# Patient Record
Sex: Female | Born: 1935 | Race: White | Hispanic: No | Smoking: Former smoker
Health system: Southern US, Community
[De-identification: ages and names within clinical notes are randomized; demographics above are authoritative.]

## PROBLEM LIST (undated history)

## (undated) DIAGNOSIS — K589 Irritable bowel syndrome without diarrhea: Secondary | ICD-10-CM

## (undated) DIAGNOSIS — I7300 Raynaud's syndrome without gangrene: Secondary | ICD-10-CM

## (undated) DIAGNOSIS — I739 Peripheral vascular disease, unspecified: Secondary | ICD-10-CM

## (undated) DIAGNOSIS — I6529 Occlusion and stenosis of unspecified carotid artery: Secondary | ICD-10-CM

## (undated) DIAGNOSIS — Z923 Personal history of irradiation: Secondary | ICD-10-CM

## (undated) DIAGNOSIS — D649 Anemia, unspecified: Secondary | ICD-10-CM

## (undated) DIAGNOSIS — I10 Essential (primary) hypertension: Secondary | ICD-10-CM

## (undated) DIAGNOSIS — N289 Disorder of kidney and ureter, unspecified: Secondary | ICD-10-CM

## (undated) DIAGNOSIS — R490 Dysphonia: Secondary | ICD-10-CM

## (undated) HISTORY — DX: Dysphonia: R49.0

## (undated) HISTORY — PX: COLONOSCOPY: SHX174

## (undated) HISTORY — PX: BREAST SURGERY: SHX581

## (undated) HISTORY — DX: Occlusion and stenosis of unspecified carotid artery: I65.29

## (undated) HISTORY — PX: HEMORRHOID SURGERY: SHX153

## (undated) HISTORY — DX: Raynaud's syndrome without gangrene: I73.00

## (undated) HISTORY — DX: Essential (primary) hypertension: I10

## (undated) HISTORY — DX: Personal history of irradiation: Z92.3

## (undated) HISTORY — DX: Peripheral vascular disease, unspecified: I73.9

## (undated) HISTORY — DX: Irritable bowel syndrome, unspecified: K58.9

## (undated) HISTORY — DX: Anemia, unspecified: D64.9

## (undated) HISTORY — DX: Disorder of kidney and ureter, unspecified: N28.9

---

## 1997-09-06 ENCOUNTER — Other Ambulatory Visit: Admission: RE | Admit: 1997-09-06 | Discharge: 1997-09-06 | Payer: Self-pay | Admitting: Specialist

## 1998-03-12 ENCOUNTER — Other Ambulatory Visit: Admission: RE | Admit: 1998-03-12 | Discharge: 1998-03-12 | Payer: Self-pay | Admitting: Family Medicine

## 1998-09-18 ENCOUNTER — Ambulatory Visit (HOSPITAL_BASED_OUTPATIENT_CLINIC_OR_DEPARTMENT_OTHER): Admission: RE | Admit: 1998-09-18 | Discharge: 1998-09-18 | Payer: Self-pay | Admitting: General Surgery

## 1998-09-18 ENCOUNTER — Encounter: Payer: Self-pay | Admitting: General Surgery

## 2000-01-20 ENCOUNTER — Encounter: Admission: RE | Admit: 2000-01-20 | Discharge: 2000-01-20 | Payer: Self-pay | Admitting: Specialist

## 2000-01-20 ENCOUNTER — Encounter: Payer: Self-pay | Admitting: Specialist

## 2000-01-22 ENCOUNTER — Encounter (INDEPENDENT_AMBULATORY_CARE_PROVIDER_SITE_OTHER): Payer: Self-pay | Admitting: Specialist

## 2000-01-22 ENCOUNTER — Ambulatory Visit (HOSPITAL_BASED_OUTPATIENT_CLINIC_OR_DEPARTMENT_OTHER): Admission: RE | Admit: 2000-01-22 | Discharge: 2000-01-22 | Payer: Self-pay | Admitting: Specialist

## 2002-04-30 ENCOUNTER — Other Ambulatory Visit: Admission: RE | Admit: 2002-04-30 | Discharge: 2002-04-30 | Payer: Self-pay | Admitting: Family Medicine

## 2002-05-18 ENCOUNTER — Encounter: Payer: Self-pay | Admitting: Family Medicine

## 2002-05-18 ENCOUNTER — Encounter: Admission: RE | Admit: 2002-05-18 | Discharge: 2002-05-18 | Payer: Self-pay | Admitting: Family Medicine

## 2002-07-18 ENCOUNTER — Encounter: Payer: Self-pay | Admitting: Family Medicine

## 2002-07-18 ENCOUNTER — Encounter: Admission: RE | Admit: 2002-07-18 | Discharge: 2002-07-18 | Payer: Self-pay | Admitting: Family Medicine

## 2004-03-11 ENCOUNTER — Ambulatory Visit (HOSPITAL_COMMUNITY): Admission: RE | Admit: 2004-03-11 | Discharge: 2004-03-11 | Payer: Self-pay | Admitting: Gastroenterology

## 2004-04-26 ENCOUNTER — Emergency Department (HOSPITAL_COMMUNITY): Admission: EM | Admit: 2004-04-26 | Discharge: 2004-04-26 | Payer: Self-pay | Admitting: Family Medicine

## 2004-04-28 ENCOUNTER — Emergency Department (HOSPITAL_COMMUNITY): Admission: EM | Admit: 2004-04-28 | Discharge: 2004-04-28 | Payer: Self-pay | Admitting: Family Medicine

## 2004-07-12 ENCOUNTER — Encounter: Admission: RE | Admit: 2004-07-12 | Discharge: 2004-07-12 | Payer: Self-pay | Admitting: Family Medicine

## 2005-07-20 ENCOUNTER — Other Ambulatory Visit: Admission: RE | Admit: 2005-07-20 | Discharge: 2005-07-20 | Payer: Self-pay | Admitting: Family Medicine

## 2008-08-15 ENCOUNTER — Encounter: Admission: RE | Admit: 2008-08-15 | Discharge: 2008-08-15 | Payer: Self-pay | Admitting: Family Medicine

## 2008-09-10 ENCOUNTER — Encounter: Admission: RE | Admit: 2008-09-10 | Discharge: 2008-09-10 | Payer: Self-pay | Admitting: Family Medicine

## 2010-06-16 DIAGNOSIS — D649 Anemia, unspecified: Secondary | ICD-10-CM

## 2010-06-16 HISTORY — DX: Anemia, unspecified: D64.9

## 2010-07-03 NOTE — Op Note (Signed)
[name], [name]               ACCOUNT NO.:  [id number]   MEDICAL RECORD NO.:  [id number]          PATIENT TYPE:  AMB   LOCATION:  ENDO                         FACILITY:  [facility]   PHYSICIAN:  Danise Edge, M.D.   DATE OF BIRTH:  28-Aug-1926   DATE OF PROCEDURE:  03/11/2004  DATE OF DISCHARGE:                                 OPERATIVE REPORT   PROCEDURE:  Colonoscopy.   INDICATION FOR PROCEDURE:  Ms. Sylvia [name] is a 77 year old female born  28-Jul-1926.  Ms. Sylvia [name] is scheduled to undergo her first screening  colonoscopy with polypectomy to prevent colon cancer.  Demerol causes  nausea.   ENDOSCOPIST:  Danise Edge, M.D.   PREMEDICATION:  Fentanyl 62.5 mcg, Versed 3.5 mg.   PROCEDURE:  After obtaining informed consent, Sylvia [name] was placed in the  left lateral decubitus position.  I administered intravenous fentanyl and  intravenous Versed to achieve conscious sedation for the procedure.  The  patient's blood pressure, oxygen saturation, and cardiac rhythm were  monitored throughout the procedure and documented in the medical record.   Anal inspection and digital rectal exam were normal.  The Olympus adjustable  pediatric colonoscope was introduced into the rectum and advanced to the  cecum.  Colonic preparation for the exam today was excellent.   Rectum:  Normal.   Sigmoid colon and descending colon:  Normal.   Splenic flexure:  Normal.   Transverse colon:  Normal.   Hepatic flexure:  Normal.   Ascending colon:  Normal.   Cecum and ileocecal valve:  Normal.   ASSESSMENT:  Normal screening proctocolonoscopy to the cecum.      MJ/MEDQ  D:  03/11/2004  T:  03/11/2004  Job:  [id number]   cc:   Donia Guiles, M.D.  [address] [place]  [place] [address]  Fax: [phone]

## 2010-10-30 ENCOUNTER — Other Ambulatory Visit: Payer: Self-pay | Admitting: Family Medicine

## 2010-11-02 ENCOUNTER — Ambulatory Visit
Admission: RE | Admit: 2010-11-02 | Discharge: 2010-11-02 | Disposition: A | Discharge status: Home / Self Care | Payer: Medicare Other | Source: Ambulatory Visit | Attending: Family Medicine | Admitting: Family Medicine

## 2011-01-22 ENCOUNTER — Other Ambulatory Visit: Payer: Self-pay | Admitting: Family Medicine

## 2011-01-22 DIAGNOSIS — R51 Headache: Secondary | ICD-10-CM

## 2011-01-26 ENCOUNTER — Ambulatory Visit
Admission: RE | Admit: 2011-01-26 | Discharge: 2011-01-26 | Disposition: A | Discharge status: Home / Self Care | Payer: Medicare Other | Source: Ambulatory Visit | Attending: Family Medicine | Admitting: Family Medicine

## 2011-01-26 DIAGNOSIS — R51 Headache: Secondary | ICD-10-CM

## 2011-06-01 ENCOUNTER — Telehealth: Payer: Self-pay | Admitting: Oncology

## 2011-06-01 NOTE — Telephone Encounter (Signed)
called pt scheduled appt for 04/22 @ 10am.  will fax a letter to Dr. Laural Benes with appt d/t

## 2011-06-02 ENCOUNTER — Telehealth: Payer: Self-pay | Admitting: Oncology

## 2011-06-02 NOTE — Telephone Encounter (Signed)
Referred by Dr. Danise Edge Dx- Anemia

## 2011-06-03 ENCOUNTER — Encounter: Payer: Self-pay | Admitting: Oncology

## 2011-06-03 DIAGNOSIS — I10 Essential (primary) hypertension: Secondary | ICD-10-CM | POA: Insufficient documentation

## 2011-06-03 DIAGNOSIS — D649 Anemia, unspecified: Secondary | ICD-10-CM | POA: Insufficient documentation

## 2011-06-03 DIAGNOSIS — I7300 Raynaud's syndrome without gangrene: Secondary | ICD-10-CM | POA: Insufficient documentation

## 2011-06-07 ENCOUNTER — Ambulatory Visit: Payer: Medicare Other

## 2011-06-07 ENCOUNTER — Ambulatory Visit (HOSPITAL_BASED_OUTPATIENT_CLINIC_OR_DEPARTMENT_OTHER): Payer: Medicare Other | Admitting: Oncology

## 2011-06-07 ENCOUNTER — Other Ambulatory Visit (HOSPITAL_BASED_OUTPATIENT_CLINIC_OR_DEPARTMENT_OTHER): Payer: Medicare Other | Admitting: Lab

## 2011-06-07 ENCOUNTER — Telehealth: Payer: Self-pay | Admitting: Oncology

## 2011-06-07 ENCOUNTER — Encounter: Payer: Self-pay | Admitting: Oncology

## 2011-06-07 VITALS — BP 148/50 | HR 70 | Temp 97.2°F | Ht 63.0 in | Wt 103.7 lb

## 2011-06-07 DIAGNOSIS — I7300 Raynaud's syndrome without gangrene: Secondary | ICD-10-CM

## 2011-06-07 DIAGNOSIS — D649 Anemia, unspecified: Secondary | ICD-10-CM

## 2011-06-07 DIAGNOSIS — R490 Dysphonia: Secondary | ICD-10-CM

## 2011-06-07 DIAGNOSIS — I10 Essential (primary) hypertension: Secondary | ICD-10-CM

## 2011-06-07 LAB — CHCC SMEAR

## 2011-06-07 LAB — CBC WITH DIFFERENTIAL/PLATELET
BASO%: 0.5 % (ref 0.0–2.0)
EOS%: 3.2 % (ref 0.0–7.0)
HGB: 9.6 g/dL — ABNORMAL LOW (ref 11.6–15.9)
MCH: 25.9 pg (ref 25.1–34.0)
MCHC: 32.2 g/dL (ref 31.5–36.0)
RDW: 16.1 % — ABNORMAL HIGH (ref 11.2–14.5)
lymph#: 0.9 10*3/uL (ref 0.9–3.3)

## 2011-06-07 LAB — MORPHOLOGY: PLT EST: INCREASED

## 2011-06-07 NOTE — Patient Instructions (Signed)
Issue:  Progressive anemia over the last few years.  - Possibilities:  Hemolysis (breakage of red blood cell); iron deficiency anemia (malabsorption) vs something is going on with the bone marrow space (MDS myelodysplastic syndrome). - Pending blood test today to see if you have hemolysis or iron deficiency anemia. - Please call my nurse Cameo at [phone] this week to let her know when is a best day for bone marrow biopsy.

## 2011-06-07 NOTE — Telephone Encounter (Signed)
Gv pt appt for may2013 

## 2011-06-07 NOTE — Progress Notes (Signed)
Sylvia [name]  Telephone:([phone] [phone]     INITIAL HEMATOLOGY CONSULTATION    Referral MD:   Dr. Rockney Ghee D. Clelia Croft, M.D.   Reason for Referral: refractory normocytic anemia.     HPI:  Sylvia [name] is an 78 year-old Caucasian woman with history of HTN, Raynaud's phenomenon; IBS.  She has had mild anemia up until last year. On 06/18/2010 her WBC 5.6; hemoglobin 11.2, platelet count 311,000.  A followup CBC was drawn on 09/04/2011 where WBC was 5.8; hemoglobin 8.2; platelet count 368K.  Anemia workupwas performed which showed  normal VitB12 and iron panel supposedly; however, I do not have these tests for confirmation. She was referred to Dr. Danise Edge for evaluation. Unable fourth 2013, she underwent diagnostic colonoscopy which showed negative colonoscopy. Since she was asymptomatic; it was deemed that EGD was not indicated. Given the refractory anemia, she was kindly referred to the cancer [name] for evaluation.  Sylvia [name] presented to the clinic by herself today. She has had fatigue that this was in the past year. 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 She noticed that she wakes up at night a lot because she's on diuretics for lood pressure.  She goes to the bathroom multiple times night; therefore, she does have insomnia. She notices she's been having nonproductive cough in addition to hoarse voice last few months. She denies shortness of breath, dyspnea exertion, PND, orthopnea, hemoptysis. She denies August was also bleeding such as gum bleed, hemoptysis, hematemesis, melena, hematochezia, vaginal bleeding, hematuria.  Patient denies visual changes, confusion, drenching night sweats, palpable lymph node swelling, mucositis, odynophagia, dysphagia, nausea vomiting, jaundice, chest pain, palpitation, shortness of breath, dyspnea on exertion, abdominal pain, abdominal swelling, early satiety, skin rash,  spontaneous bleeding, joint swelling, joint pain, heat or cold intolerance, bowel bladder incontinence, back pain, focal motor weakness, paresthesia, depression, suicidal or homocidal ideation, feeling hopelessness.     Past Medical History  Diagnosis Date  . HTN (hypertension)   . Renal insufficiency   . IBS (irritable bowel syndrome)   . Anemia 06/2010  . Raynaud's syndrome   . PVD (peripheral vascular disease)   . Carotid stenosis   . Raynaud phenomenon since 1990's  . Hoarseness of voice   :    Past Surgical History  Procedure Date  . Colonoscopy 02/2004 and 05/2011    last colonoscopy in April 2013 was negative by Dr. Laural Benes  . Hemorrhoid surgery   :   CURRENT MEDS: Current Outpatient Prescriptions  Medication Sig Dispense Refill  . BENICAR HCT 40-12.5 MG per tablet       . BIOTIN PO Take by mouth daily.      . felodipine (PLENDIL) 10 MG 24 hr tablet       . ferrous sulfate 325 (65 FE) MG tablet Take 325 mg by mouth daily with breakfast.      . fish oil-omega-3 fatty acids 1000 MG capsule Take 1 g by mouth daily.      . vitamin C (ASCORBIC ACID) 500 MG tablet Take 500 mg by mouth daily.      Marland Kitchen VITAMIN D, CHOLECALCIFEROL, PO Take by mouth daily.      . Vitamin Mixture (VITAMIN E COMPLETE) CAPS Take by mouth daily.          No Known Allergies:  Family History  Problem Relation Age of Onset  . Pneumonia Mother   . Stroke Father   :  History   Social History  . Marital Status: Widowed    Spouse Name: N/A    Number of Children: 4  . Years of Education: N/A   Occupational History  .      retired from [employer].  Currently works at [employer] History Main Topics  . Smoking status: Former Smoker -- 0.2 packs/day for 60 years  . Smokeless tobacco: Not on file  . Alcohol Use: No  . Drug Use: No  . Sexually Active: No   Other Topics Concern  . Not on file   Social History Narrative  . No narrative on file  :  REVIEW OF SYSTEM:  The rest of the  14-point review of sytem was negative.   Exam:  General:  Thin-appearing woman in no acute distress.  Eyes:  no scleral icterus.  ENT:  There were no oropharyngeal lesions.  Neck was without thyromegaly.  Lymphatics:  Negative cervical, supraclavicular or axillary adenopathy.  Respiratory: lungs were clear bilaterally without wheezing or crackles.  Cardiovascular:  Regular rate and rhythm, S1/S2, without murmur, rub or gallop.  There was no pedal edema.  GI:  abdomen was soft, flat, nontender, nondistended, without organomegaly.  Muscoloskeletal:  no spinal tenderness of palpation of vertebral spine.  Skin exam was without echymosis, petichae.  Neuro exam was nonfocal.  Patient was able to get on and off exam table without assistance.  Gait was normal.  Patient was alerted and oriented.  Attention was good.   Language was appropriate.  Mood was normal without depression.  Speech was not pressured.  Thought content was not tangential.    LABS:  Lab Results  Component Value Date   WBC 6.1 06/07/2011   HGB 9.6* 06/07/2011   HCT 29.8* 06/07/2011   PLT 456* 06/07/2011   GLUCOSE 108* 06/07/2011   ALT 12 06/07/2011   AST 21 06/07/2011   NA 134* 06/07/2011   K 4.5 06/07/2011   CL 96 06/07/2011   CREATININE 0.98 06/07/2011   BUN 32* 06/07/2011   CO2 27 06/07/2011    Blood smear review:   I personally reviewed the patient's peripheral blood smear today.  There was isocytosis.  There was no peripheral blast.  There was no schistocytosis, spherocytosis, target cell, rouleaux formation, tear drop cell.  There was no giant platelets or platelet clumps.     ASSESSMENT AND PLAN:   1.  HTN:  Well controlled on Benicar/HCTZ; Felodipine, per PCP.   2.  History of smoking:  She quit 2 years ago.   3.  Hoarse voice:  She would like to defer work up of the hoarse voice for now.  She really wants to have the anemia figured out first.  In the future, I may consider sending her out for work up with PA/Lact CXR given  her history of smoking to rule out bronchogenic carcinoma; and if negative, ENT referral.  4.  Normocytic anemia with negative GI work up. - Differential:  Ruled out for hemolysis with normal LDH, Tbili, and haptoglobin.  I do not think that her Raynaud's phenomenon has any relation with her anemia since cold agglutinin disease would present with hemolysis.  I sent for complete iron panel to see if there is sign of iron deficiency which can be due to malabsorption as her colonoscopy was negative.  However, I am considering bone marrow failure state such as MDS, pure red cell aplasia due to the progressive nature of her anemia.  - Work  up:  I recommended diagnostic bone marrow biopsy this week.  I discussed with her the potential risk and benefits of diagnostic bone marrow biopsy. The benefit would be to take a watch as anemia. These risks include but not limited to infection, bleeding, perforation, pain. She expressed informed understanding and wished to proceed with the bone marrow biopsy this week. - Treatment:  We briefly talked about pRBC transfusion for Hgb <7 if diagnosis is non revealing.  She inquired about the treatment for MDS.  We briefly discussed that if bone marrow biopsy shows that she has MDS; and the treatment will depend on the risk category.  Thus, treatment for MDS can range anywhere from pRBC transfusion to Aranesp injection to chemotherapy. - Follow up:  I will see her in about 4 wks to go over result of diagnostic bone marrow biopsy including cytogenetics.   The length of time of the face-to-face encounter was 45 minutes. More than 50% of time was spent counseling and coordination of care.

## 2011-06-09 LAB — LACTATE DEHYDROGENASE: LDH: 175 U/L (ref 94–250)

## 2011-06-09 LAB — COMPREHENSIVE METABOLIC PANEL
AST: 21 U/L (ref 0–37)
Albumin: 4.2 g/dL (ref 3.5–5.2)
Alkaline Phosphatase: 70 U/L (ref 39–117)
Potassium: 4.5 mEq/L (ref 3.5–5.3)
Sodium: 134 mEq/L — ABNORMAL LOW (ref 135–145)
Total Protein: 7 g/dL (ref 6.0–8.3)

## 2011-06-09 LAB — KAPPA/LAMBDA LIGHT CHAINS: Kappa free light chain: 2.13 mg/dL — ABNORMAL HIGH (ref 0.33–1.94)

## 2011-06-09 LAB — PROTEIN ELECTROPHORESIS, SERUM
Albumin ELP: 55.7 % — ABNORMAL LOW (ref 55.8–66.1)
Alpha-1-Globulin: 5 % — ABNORMAL HIGH (ref 2.9–4.9)
Gamma Globulin: 16.7 % (ref 11.1–18.8)

## 2011-06-09 LAB — IRON AND TIBC: %SAT: 59 % — ABNORMAL HIGH (ref 20–55)

## 2011-06-10 ENCOUNTER — Ambulatory Visit (HOSPITAL_BASED_OUTPATIENT_CLINIC_OR_DEPARTMENT_OTHER): Payer: Medicare Other | Admitting: Oncology

## 2011-06-10 ENCOUNTER — Other Ambulatory Visit (HOSPITAL_COMMUNITY)
Admission: RE | Admit: 2011-06-10 | Discharge: 2011-06-10 | Disposition: A | Discharge status: Home / Self Care | Payer: Medicare Other | Source: Ambulatory Visit | Attending: Oncology | Admitting: Oncology

## 2011-06-10 ENCOUNTER — Encounter: Payer: Medicare Other | Admitting: Medical Oncology

## 2011-06-10 VITALS — BP 133/49 | HR 64 | Temp 97.7°F

## 2011-06-10 DIAGNOSIS — D649 Anemia, unspecified: Secondary | ICD-10-CM

## 2011-06-10 DIAGNOSIS — D4620 Refractory anemia with excess of blasts, unspecified: Secondary | ICD-10-CM

## 2011-06-10 DIAGNOSIS — C3490 Malignant neoplasm of unspecified part of unspecified bronchus or lung: Secondary | ICD-10-CM

## 2011-06-10 NOTE — Patient Instructions (Signed)
Bone Marrow Aspiration, Bone Marrow Biopsy Care After Read the instructions outlined below and refer to this sheet in the next few weeks. These discharge instructions provide you with general information on caring for yourself after you leave the hospital. Your caregiver may also give you specific instructions. While your treatment has been planned according to the most current medical practices available, unavoidable complications occasionally occur. If you have any problems or questions after discharge, call your caregiver. FINDING OUT THE RESULTS OF YOUR TEST Not all test results are available during your visit. If your test results are not back during the visit, make an appointment with your caregiver to find out the results. Do not assume everything is normal if you have not heard from your caregiver or the medical facility. It is important for you to follow up on all of your test results.  HOME CARE INSTRUCTIONS   Keep your dressing clean and dry. You may replace dressing with a bandage after 24 hours.   You may take a bath or shower after 24 hours.   Use an ice pack for 20 minutes every 2 hours while awake for pain as needed.  SEEK MEDICAL CARE IF:   There is redness, swelling, or increasing pain at the biopsy site.   There is pus coming from the biopsy site.   There is drainage from a biopsy site lasting longer than one day.   An unexplained oral temperature above 102 F (38.9 C) develops.  SEEK IMMEDIATE MEDICAL CARE IF:   You develop a rash.   You have difficulty breathing.   You develop any reaction or side effects to medications given.  Document Released: 08/21/2004 Document Revised: 01/21/2011 Document Reviewed: 01/30/2008 ExitCare Patient Information 2012 [employer]. 

## 2011-06-10 NOTE — Progress Notes (Signed)
   [facility]  Telephone:([phone] Fax:(336) [phone]   BONE MARROW BIOPSY AND ASPIRATION   INDICATION:  Refractory anemia with negative extensive work up.   Procedure: After obtained from consent, Sylvia [name] was placed in the prone position. Time out was performed verifying correct patient and procedure. The skin overlying the left posterior crest was prepped with Betadine and draped in the usual sterile fashion. The skin and periosteum were infiltrated with 10 mL of 2% lidocaine. A small puncture wound was made with #11 scalpel blade.  Bone marrow aspirate was obtained on the first pass of the aspiration needle.  One core biopsy was obtained through the same incision.   The aspirate was sent for routine histology, flow cytometry, and cytogenetics.  Core biopsy was sent for routine histology.   Sylvia [name] tolerated procedure well with minimal  blood loss and without immediate complication.   A sterile dressing was applied.   Jethro Bolus M.D. 06/10/2011

## 2011-06-16 ENCOUNTER — Telehealth: Payer: Self-pay | Admitting: *Deleted

## 2011-06-16 NOTE — Telephone Encounter (Signed)
Pt called to report she continues to feel very tired and having difficulty getting to work.  States a lot of frustration and wondering what the plan is to help her get better.  States also having "bowel issues" and "leg pains."   Listened to pt for aprox 10 minutes and explained Dr. Gaylyn Rong providing work up for anemia and will go over results on next appt on 07/07/11.   Asked if pt is feeling more tired than on last appt,  We can check her labs again,  But to call PCP regarding her bowels and leg pains.   Pt states she doesn't feel any more tired, but the same ongoing fatigue.  She will call her PCP and keep appt w/ Dr. Gaylyn Rong on 5/22 as scheduled.

## 2011-07-05 ENCOUNTER — Telehealth: Payer: Self-pay | Admitting: *Deleted

## 2011-07-05 NOTE — Telephone Encounter (Signed)
Pt called to confirm her appts for Wed and to report ongoing fatigue and worsening hoarseness, congestion, cough. She says she recently saw her PCP who told her symptoms are r/t her anemia.  Pt voiced some frustration about feeling so tired for "so long"  Encouraged pt to keep her appt here on 07/07/11 and discuss her concerns w/ Dr. Gaylyn Rong.  She verbalized understanding.

## 2011-07-05 NOTE — Telephone Encounter (Signed)
Pt left VM states has question about her appt on Wednesday.  I called pt back and left her a VM asking her to return call again so I can answer her question.

## 2011-07-07 ENCOUNTER — Other Ambulatory Visit (HOSPITAL_BASED_OUTPATIENT_CLINIC_OR_DEPARTMENT_OTHER): Payer: Medicare Other | Admitting: Lab

## 2011-07-07 ENCOUNTER — Ambulatory Visit (HOSPITAL_BASED_OUTPATIENT_CLINIC_OR_DEPARTMENT_OTHER): Payer: Medicare Other | Admitting: Oncology

## 2011-07-07 VITALS — BP 146/56 | HR 63 | Ht 63.0 in | Wt 100.8 lb

## 2011-07-07 DIAGNOSIS — R490 Dysphonia: Secondary | ICD-10-CM

## 2011-07-07 DIAGNOSIS — D649 Anemia, unspecified: Secondary | ICD-10-CM

## 2011-07-07 DIAGNOSIS — I10 Essential (primary) hypertension: Secondary | ICD-10-CM

## 2011-07-07 DIAGNOSIS — I7300 Raynaud's syndrome without gangrene: Secondary | ICD-10-CM

## 2011-07-07 DIAGNOSIS — R5381 Other malaise: Secondary | ICD-10-CM

## 2011-07-07 LAB — CBC WITH DIFFERENTIAL/PLATELET
BASO%: 0.7 % (ref 0.0–2.0)
Basophils Absolute: 0 10e3/uL (ref 0.0–0.1)
EOS%: 0.8 % (ref 0.0–7.0)
Eosinophils Absolute: 0 10e3/uL (ref 0.0–0.5)
HCT: 33.5 % — ABNORMAL LOW (ref 34.8–46.6)
HGB: 11 g/dL — ABNORMAL LOW (ref 11.6–15.9)
LYMPH%: 17.1 % (ref 14.0–49.7)
MCH: 26.7 pg (ref 25.1–34.0)
MCHC: 32.7 g/dL (ref 31.5–36.0)
MCV: 81.8 fL (ref 79.5–101.0)
MONO#: 0.8 10e3/uL (ref 0.1–0.9)
MONO%: 13.3 % (ref 0.0–14.0)
NEUT#: 4 10e3/uL (ref 1.5–6.5)
NEUT%: 68.1 % (ref 38.4–76.8)
Platelets: 474 10e3/uL — ABNORMAL HIGH (ref 145–400)
RBC: 4.1 10e6/uL (ref 3.70–5.45)
RDW: 18.9 % — ABNORMAL HIGH (ref 11.2–14.5)
WBC: 5.9 10e3/uL (ref 3.9–10.3)
lymph#: 1 10e3/uL (ref 0.9–3.3)
nRBC: 0 % (ref 0–0)

## 2011-07-07 NOTE — Progress Notes (Signed)
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  Bone marrow biopsy in 05/2011 showed normal trilineage hematopoiesis; cytogenetics were normal.  GI work up in 2013 was negative.   CURRENT THERAPY:  Watchful observation.   INTERVAL HISTORY: Sylvia [name] 78 y.o. female returns for regular follow up to go over result of bone marrow biopsy.  She reports that she still has mild fatigue.  Despite this fatigue, she still works at [employer]' and able to climb flight of stairs at home.  She has seasonal allergy with cough and hoarse voice.     Past Medical History  Diagnosis Date  . HTN (hypertension)   . Renal insufficiency   . IBS (irritable bowel syndrome)   . Anemia 06/2010  . Raynaud's syndrome   . PVD (peripheral vascular disease)   . Carotid stenosis   . Raynaud phenomenon since 1990's  . Hoarseness of voice     Past Surgical History  Procedure Date  . Colonoscopy 02/2004 and 05/2011    last colonoscopy in April 2013 was negative by Dr. Laural Benes  . Hemorrhoid surgery     Current Outpatient Prescriptions  Medication Sig Dispense Refill  . BENICAR HCT 40-12.5 MG per tablet       . BIOTIN PO Take by mouth daily.      . felodipine (PLENDIL) 10 MG 24 hr tablet       . ferrous sulfate 325 (65 FE) MG tablet Take 325 mg by mouth daily with breakfast.      . fish oil-omega-3 fatty acids 1000 MG capsule Take 1 g by mouth daily.      . vitamin C (ASCORBIC ACID) 500 MG tablet Take 500 mg by mouth daily.      Marland Kitchen VITAMIN D, CHOLECALCIFEROL, PO Take by mouth daily.      . Vitamin Mixture (VITAMIN E COMPLETE) CAPS Take by mouth daily.        ALLERGIES:   has no known allergies.  REVIEW OF SYSTEMS:  The rest of the 14-point review of system was negative.   Filed Vitals:   07/07/11 1152  BP: 146/56  Pulse: 63   Wt Readings  from Last 3 Encounters:  07/07/11 100 lb 12.8 oz (45.723 kg)  06/07/11 103 lb 11.2 oz (47.038 kg)   ECOG Performance status: 1  PHYSICAL EXAMINATION:   General:  Thin-appearing woman, in no acute distress.  Eyes:  no scleral icterus.  ENT:  There were no oropharyngeal lesions.  Neck was without thyromegaly.  Lymphatics:  Negative cervical, supraclavicular or axillary adenopathy.  Respiratory: lungs were clear bilaterally without wheezing or crackles.  Cardiovascular:  Regular rate and rhythm, S1/S2, without murmur, rub or gallop.  There was no pedal edema.  GI:  abdomen was soft, flat, nontender, nondistended, without organomegaly.  Muscoloskeletal:  no spinal tenderness of palpation of vertebral spine.  Skin exam was without echymosis, petichae.  Neuro exam was nonfocal.  Patient was able to get on and off exam table without assistance.  Gait was normal.  Patient was alerted and oriented.  Attention was good.   Language was appropriate.  Mood was normal without depression.  Speech was not pressured.  Thought content was not tangential.     LABORATORY/RADIOLOGY DATA:  Lab Results  Component Value Date   WBC 5.9 07/07/2011   HGB 11.0* 07/07/2011  HCT 33.5* 07/07/2011   PLT 474* 07/07/2011   GLUCOSE 108* 06/07/2011   ALKPHOS 70 06/07/2011   ALT 12 06/07/2011   AST 21 06/07/2011   NA 134* 06/07/2011   K 4.5 06/07/2011   CL 96 06/07/2011   CREATININE 0.98 06/07/2011   BUN 32* 06/07/2011   CO2 27 06/07/2011    ASSESSMENT AND PLAN:   1. HTN: Well controlled on Benicar/HCTZ; Felodipine, per PCP.   2. History of smoking: She quit in 2011.   3. Hoarse voice: due to allergy.  If it persists, I may consider referral to ENT.   4. Normocytic anemia with negative GI work up.  - most likely due to anemia of dyserythropoiesis.  Bone marrow biopsy including cytogenetics were negative.  GI work up was negative.  Her Hgb today improved to 11.  There is no indication for pRBC transfusion or intervention given  the mild degree of anemia.   - I recommended watchful observation for now.  In the future, if she has significant anemia requiring pRBC transfusion, we may consider Aranesp injection.    5.  Fatigue:  Unclear etiology.  It's not from her mild anemia.  I recommended her to talk with her PCP to see if TSH was ever sent in the past to rule out hypothyroidism as the cause of her fatigue.   6.  Follow up:  Lab appointment in about 4 months.  RV with Belenda Cruise in about 8 months.     The length of time of the face-to-face encounter was 10   minutes. More than 50% of time was spent counseling and coordination of care.

## 2011-07-07 NOTE — Patient Instructions (Signed)
A.  Anemia:  No sign of cancer on bone marrow biopsy.  Most likely anemia is due to age.  Today, Hgb is improved compared to last visit.  B.  Recommendation:  Watchful observation.  C.  Follow up:  - Lab only appointment in about 4 months. - Lab/RV with my nurse practitioner Belenda Cruise in about 8 months.

## 2011-12-07 ENCOUNTER — Other Ambulatory Visit: Payer: Self-pay | Admitting: Neurology

## 2011-12-07 DIAGNOSIS — R51 Headache: Secondary | ICD-10-CM

## 2011-12-09 ENCOUNTER — Other Ambulatory Visit: Payer: Medicare Other

## 2011-12-09 ENCOUNTER — Ambulatory Visit
Admission: RE | Admit: 2011-12-09 | Discharge: 2011-12-09 | Disposition: A | Discharge status: Home / Self Care | Payer: Medicare Other | Source: Ambulatory Visit | Attending: Neurology | Admitting: Neurology

## 2011-12-09 DIAGNOSIS — R51 Headache: Secondary | ICD-10-CM

## 2011-12-14 ENCOUNTER — Encounter (INDEPENDENT_AMBULATORY_CARE_PROVIDER_SITE_OTHER): Payer: Self-pay

## 2011-12-24 ENCOUNTER — Encounter (INDEPENDENT_AMBULATORY_CARE_PROVIDER_SITE_OTHER): Payer: Self-pay | Admitting: Surgery

## 2011-12-24 ENCOUNTER — Ambulatory Visit (INDEPENDENT_AMBULATORY_CARE_PROVIDER_SITE_OTHER): Payer: Medicare Other | Admitting: Surgery

## 2011-12-24 VITALS — BP 138/62 | HR 65 | Temp 98.0°F | Resp 20 | Ht 63.0 in | Wt 106.2 lb

## 2011-12-24 DIAGNOSIS — R519 Headache, unspecified: Secondary | ICD-10-CM | POA: Insufficient documentation

## 2011-12-24 DIAGNOSIS — R51 Headache: Secondary | ICD-10-CM

## 2011-12-24 NOTE — Progress Notes (Signed)
NAME: Sylvia [name] DOB: 01/18/1927 MRN: [id number]                                                                                      DATE: 12/24/2011  PCP: SHAW,KIMBERLEE, MD Referring Provider: Shaw, Kimberlee, MD  IMPRESSION:  Headaches, uncertain etiology, neurologist requests a temporal artery biopsy.  PLAN:   we will schedule a temporal artery biopsy. I discussed procedure risks and complications to the patient. We can do this under local anesthesia. Her headaches are bilateral so we will check with the neurologist to see if she prefers a right or a left temporal artery biopsy. I think all questions have been answered with the patient.                 CC:  Chief Complaint  Patient presents with  . New Evaluation    ? temporal artery biopsy    HPI:  Sylvia [name] is a 78 y.o.  female who presents for evaluation of headaches and need for temporal artery biopsy. She is 78-year-old lady who is referred by her neurologist for temporal artery biopsy. She has had about a six-month history of severe headaches without triggering event noted. She has had an MRI scan which was negative. No other changes have been found on neurological exam. He neurologist believes that a temporal artery biopsy would be helpful in elucidating the cause of her headaches.  PMH:  has a past medical history of HTN (hypertension); Renal insufficiency; IBS (irritable bowel syndrome); Anemia (06/2010); Raynaud's syndrome; PVD (peripheral vascular disease); Carotid stenosis; Raynaud phenomenon (since 1990's); and Hoarseness of voice.  PSH:   has past surgical history that includes Colonoscopy (02/2004 and 05/2011); Hemorrhoid surgery; Breast surgery; and Hemorrhoid surgery.  ALLERGIES:  No Known Allergies  MEDICATIONS: Current outpatient prescriptions:BENICAR HCT 40-12.5 MG per tablet, , Disp: , Rfl: ;  felodipine (PLENDIL) 10 MG 24 hr tablet, , Disp: , Rfl: ;  ferrous sulfate 325 (65 FE) MG tablet, Take 325 mg by  mouth daily with breakfast., Disp: , Rfl: ;  fish oil-omega-3 fatty acids 1000 MG capsule, Take 1 g by mouth daily., Disp: , Rfl: ;  vitamin C (ASCORBIC ACID) 500 MG tablet, Take 500 mg by mouth daily., Disp: , Rfl:  VITAMIN D, CHOLECALCIFEROL, PO, Take by mouth daily., Disp: , Rfl: ;  Vitamin Mixture (VITAMIN E COMPLETE) CAPS, Take by mouth daily., Disp: , Rfl: ;  BIOTIN PO, Take by mouth daily., Disp: , Rfl:   ROS: She has filled out our 12 point review of systems and it is negative except for constipation and nasal throat congestion EXAM:   VS: BP 138/62  Pulse 65  Temp 98 F (36.7 C) (Oral)  Resp 20  Ht 5' 3" (1.6 m)  Wt 106 lb 3.2 oz (48.172 kg)  BMI 18.81 kg/m2 Gen:the patient is alert and oriented generally healthy appearing looks younger than her stated age oriented  Head: There's no obvious lesions noted. Temporal arteries are palpable bilaterally. There appear normal and are nontender.  DATA REVIEWED:  I reviewed the notes from the neurology office as well as the MRI report which does show   moderate diffuse and severe mesial temporal atrophy. There is also some small vessel ischemic disease noted. No other lesions.    Samba Cumba J 12/24/2011  CC: Shaw, Kimberlee, MD, SHAW,KIMBERLEE, MD        

## 2011-12-24 NOTE — Patient Instructions (Signed)
We will schedule the surgery to do a temporal artery biopsy under local anesthesia. We will check with your neurologist to see if she has a preference for whether it is the right or left side.

## 2011-12-27 ENCOUNTER — Other Ambulatory Visit (INDEPENDENT_AMBULATORY_CARE_PROVIDER_SITE_OTHER): Payer: Self-pay | Admitting: Surgery

## 2011-12-29 NOTE — Progress Notes (Signed)
Pt to drive herself-to take am meds and arrive 8am-

## 2011-12-30 ENCOUNTER — Ambulatory Visit (HOSPITAL_BASED_OUTPATIENT_CLINIC_OR_DEPARTMENT_OTHER)
Admission: RE | Admit: 2011-12-30 | Discharge: 2011-12-30 | Disposition: A | Discharge status: Home / Self Care | Payer: Medicare Other | Source: Ambulatory Visit | Attending: Surgery | Admitting: Surgery

## 2011-12-30 ENCOUNTER — Encounter (HOSPITAL_BASED_OUTPATIENT_CLINIC_OR_DEPARTMENT_OTHER): Payer: Self-pay | Admitting: *Deleted

## 2011-12-30 ENCOUNTER — Encounter (HOSPITAL_BASED_OUTPATIENT_CLINIC_OR_DEPARTMENT_OTHER)
Admission: RE | Disposition: A | Discharge status: Home / Self Care | Payer: Self-pay | Source: Ambulatory Visit | Attending: Surgery

## 2011-12-30 DIAGNOSIS — I10 Essential (primary) hypertension: Secondary | ICD-10-CM | POA: Insufficient documentation

## 2011-12-30 DIAGNOSIS — Z79899 Other long term (current) drug therapy: Secondary | ICD-10-CM | POA: Insufficient documentation

## 2011-12-30 DIAGNOSIS — I7300 Raynaud's syndrome without gangrene: Secondary | ICD-10-CM | POA: Insufficient documentation

## 2011-12-30 DIAGNOSIS — R51 Headache: Secondary | ICD-10-CM

## 2011-12-30 DIAGNOSIS — I739 Peripheral vascular disease, unspecified: Secondary | ICD-10-CM | POA: Insufficient documentation

## 2011-12-30 HISTORY — PX: ARTERY BIOPSY: SHX891

## 2011-12-30 SURGERY — MINOR BIOPSY TEMPORAL ARTERY
Anesthesia: LOCAL | Site: Face | Laterality: Right | Wound class: Clean

## 2011-12-30 MED ORDER — LIDOCAINE HCL (PF) 1 % IJ SOLN
INTRAMUSCULAR | Status: DC | PRN
  Administered 2011-12-30: 20 mL

## 2011-12-30 MED ORDER — SODIUM BICARBONATE 4 % IV SOLN
INTRAVENOUS | Status: DC | PRN
  Administered 2011-12-30: 2 mL via INTRAVENOUS

## 2011-12-30 SURGICAL SUPPLY — 35 items
ADH SKN CLS APL DERMABOND .7 (GAUZE/BANDAGES/DRESSINGS) ×1
APL SKNCLS STERI-STRIP NONHPOA (GAUZE/BANDAGES/DRESSINGS)
BENZOIN TINCTURE PRP APPL 2/3 (GAUZE/BANDAGES/DRESSINGS) IMPLANT
BLADE SURG 15 STRL LF DISP TIS (BLADE) ×1 IMPLANT
BLADE SURG 15 STRL SS (BLADE) ×2
CLOTH BEACON ORANGE TIMEOUT ST (SAFETY) ×2 IMPLANT
DERMABOND ADVANCED (GAUZE/BANDAGES/DRESSINGS) ×1
DERMABOND ADVANCED .7 DNX12 (GAUZE/BANDAGES/DRESSINGS) IMPLANT
DRSG TEGADERM 4X4.75 (GAUZE/BANDAGES/DRESSINGS) IMPLANT
ELECT REM PT RETURN 9FT ADLT (ELECTROSURGICAL)
ELECTRODE REM PT RTRN 9FT ADLT (ELECTROSURGICAL) IMPLANT
GAUZE SPONGE 4X4 12PLY STRL LF (GAUZE/BANDAGES/DRESSINGS) IMPLANT
GAUZE SPONGE 4X4 16PLY XRAY LF (GAUZE/BANDAGES/DRESSINGS) IMPLANT
GLOVE ECLIPSE 6.5 STRL STRAW (GLOVE) ×4 IMPLANT
GLOVE EUDERMIC 7 POWDERFREE (GLOVE) ×2 IMPLANT
MARKER SKIN DUAL TIP RULER LAB (MISCELLANEOUS) ×2 IMPLANT
NDL HYPO 25X1 1.5 SAFETY (NEEDLE) ×1 IMPLANT
NDL SAFETY ECLIPSE 18X1.5 (NEEDLE) ×1 IMPLANT
NEEDLE HYPO 18GX1.5 SHARP (NEEDLE) ×2
NEEDLE HYPO 25X1 1.5 SAFETY (NEEDLE) ×2 IMPLANT
PENCIL BUTTON HOLSTER BLD 10FT (ELECTRODE) IMPLANT
STRIP CLOSURE SKIN 1/2X4 (GAUZE/BANDAGES/DRESSINGS) IMPLANT
SUT ETHILON 3 0 PS 1 (SUTURE) IMPLANT
SUT ETHILON 4 0 PS 2 18 (SUTURE) IMPLANT
SUT MNCRL AB 4-0 PS2 18 (SUTURE) ×1 IMPLANT
SUT PROLENE 3 0 PS 2 (SUTURE) IMPLANT
SUT PROLENE 5 0 P 3 (SUTURE) IMPLANT
SUT VIC AB 3-0 FS2 27 (SUTURE) IMPLANT
SUT VIC AB 4-0 BRD 54 (SUTURE) IMPLANT
SUT VIC AB 4-0 P-3 18XBRD (SUTURE) IMPLANT
SUT VIC AB 4-0 P3 18 (SUTURE)
SUT VIC AB 4-0 SH 18 (SUTURE) ×1 IMPLANT
SWABSTICK POVIDONE IODINE SNGL (MISCELLANEOUS) ×4 IMPLANT
SYR CONTROL 10ML LL (SYRINGE) ×2 IMPLANT
TOWEL OR 17X24 6PK STRL BLUE (TOWEL DISPOSABLE) IMPLANT

## 2011-12-30 NOTE — Op Note (Signed)
Sylvia [name] 1926-09-21 [id number] 12/24/2011  Preoperative diagnosis: headaches, rule out temporal arteritis  Postoperative diagnosis: same  Procedure: right temporal artery biopsy  Surgeon: Currie Paris, MD, FACS  Anesthesia: General   Clinical History and Indications: this patient has been evaluated by her neurologist for intermittent headaches. A temporal artery biopsy has been requested since temporal arteritis is in the diagnostic possibilities. The patient was agreeable.    Description of Procedure: I saw the patient in the preoperative area and confirmed the plans. I marked the right temporal artery as the site for the surgical biopsy.  The patient was taken to the operating room a timeout was done. The area of the right temporal artery was clipped prepped and draped. 1% Xylocaine was infiltrated. A vertical incision over the temporal artery just anterior to the year was made. Blunt dissection was used to identify the artery. A suture was placed around the artery for traction and a long segment of about 2.5 cm removed. The artery was ligated with 4-0 Vicryl at either end and a segment intervening removed. Everything appeared to be dry.the incision was closed with 4-0 Vicryl, 4 amount subjective, and Dermabond. The patient tolerated the procedure well and there were no operative complications.  Currie Paris, MD, FACS 12/30/2011 9:07 AM

## 2011-12-30 NOTE — Interval H&P Note (Signed)
History and Physical Interval Note:  12/30/2011 8:30 AM  Sylvia [name]  has presented today for surgery, with the diagnosis of headaches  The various methods of treatment have been discussed with the patient and family. After consideration of risks, benefits and other options for treatment, the patient has consented to  Procedure(s) (LRB) with comments: MINOR BIOPSY TEMPORAL ARTERY (Right) - temoral artery biopsy -right side as a surgical intervention .  The patient's history has been reviewed, patient examined, no change in status, stable for surgery.  I have reviewed the patient's chart and labs.  Questions were answered to the patient's satisfaction.   Neurologist specified right temporal artery biospy  Camilo Mander J

## 2011-12-30 NOTE — H&P (View-Only) (Signed)
NAME: Sylvia [name] DOB: 03/29/1926 MRN: [id number]                                                                                      DATE: 12/24/2011  PCP: Lupita Raider, MD Referring Provider: Lupita Raider, MD  IMPRESSION:  Headaches, uncertain etiology, neurologist requests a temporal artery biopsy.  PLAN:   we will schedule a temporal artery biopsy. I discussed procedure risks and complications to the patient. We can do this under local anesthesia. Her headaches are bilateral so we will check with the neurologist to see if she prefers a right or a left temporal artery biopsy. I think all questions have been answered with the patient.                 CC:  Chief Complaint  Patient presents with  . New Evaluation    ? temporal artery biopsy    HPI:  Sylvia [name] is a 78 y.o.  female who presents for evaluation of headaches and need for temporal artery biopsy. She is 78 year old lady who is referred by her neurologist for temporal artery biopsy. She has had about a six-month history of severe headaches without triggering event noted. She has had an MRI scan which was negative. No other changes have been found on neurological exam. He neurologist believes that a temporal artery biopsy would be helpful in elucidating the cause of her headaches.  PMH:  has a past medical history of HTN (hypertension); Renal insufficiency; IBS (irritable bowel syndrome); Anemia (06/2010); Raynaud's syndrome; PVD (peripheral vascular disease); Carotid stenosis; Raynaud phenomenon (since 1990's); and Hoarseness of voice.  PSH:   has past surgical history that includes Colonoscopy (02/2004 and 05/2011); Hemorrhoid surgery; Breast surgery; and Hemorrhoid surgery.  ALLERGIES:  No Known Allergies  MEDICATIONS: Current outpatient prescriptions:BENICAR HCT 40-12.5 MG per tablet, , Disp: , Rfl: ;  felodipine (PLENDIL) 10 MG 24 hr tablet, , Disp: , Rfl: ;  ferrous sulfate 325 (65 FE) MG tablet, Take 325 mg by  mouth daily with breakfast., Disp: , Rfl: ;  fish oil-omega-3 fatty acids 1000 MG capsule, Take 1 g by mouth daily., Disp: , Rfl: ;  vitamin C (ASCORBIC ACID) 500 MG tablet, Take 500 mg by mouth daily., Disp: , Rfl:  VITAMIN D, CHOLECALCIFEROL, PO, Take by mouth daily., Disp: , Rfl: ;  Vitamin Mixture (VITAMIN E COMPLETE) CAPS, Take by mouth daily., Disp: , Rfl: ;  BIOTIN PO, Take by mouth daily., Disp: , Rfl:   ROS: She has filled out our 12 point review of systems and it is negative except for constipation and nasal throat congestion EXAM:   VS: BP 138/62  Pulse 65  Temp 98 F (36.7 C) (Oral)  Resp 20  Ht 5\' 3"  (1.6 m)  Wt 106 lb 3.2 oz (48.172 kg)  BMI 18.81 kg/m2 Gen:the patient is alert and oriented generally healthy appearing looks younger than her stated age oriented  Head: There's no obvious lesions noted. Temporal arteries are palpable bilaterally. There appear normal and are nontender.  DATA REVIEWED:  I reviewed the notes from the neurology office as well as the MRI report which does show  moderate diffuse and severe mesial temporal atrophy. There is also some small vessel ischemic disease noted. No other lesions.    Sylvia [name] J 12/24/2011  CC: Lupita Raider, MD, Lupita Raider, MD

## 2011-12-31 ENCOUNTER — Encounter (HOSPITAL_BASED_OUTPATIENT_CLINIC_OR_DEPARTMENT_OTHER): Payer: Self-pay | Admitting: Surgery

## 2012-01-03 ENCOUNTER — Telehealth (INDEPENDENT_AMBULATORY_CARE_PROVIDER_SITE_OTHER): Payer: Self-pay | Admitting: General Surgery

## 2012-01-03 NOTE — Telephone Encounter (Signed)
Message copied by Liliana Cline on Mon Jan 03, 2012  8:12 AM ------      Message from: Currie Paris      Created: Fri Dec 31, 2011  1:43 PM       Tell her path shows no arteritis so that is not the cause of her headaches

## 2012-01-03 NOTE — Telephone Encounter (Signed)
Patient aware path results did not show arteritis. She will follow up in the office at her scheduled appt and call with any questions prior. Copy of pathology faxed to Lupita Raider, MD [phone].

## 2012-01-21 ENCOUNTER — Encounter (INDEPENDENT_AMBULATORY_CARE_PROVIDER_SITE_OTHER): Payer: Medicare Other | Admitting: Surgery

## 2012-01-25 ENCOUNTER — Ambulatory Visit (INDEPENDENT_AMBULATORY_CARE_PROVIDER_SITE_OTHER): Payer: Medicare Other | Admitting: Surgery

## 2012-01-25 ENCOUNTER — Encounter (INDEPENDENT_AMBULATORY_CARE_PROVIDER_SITE_OTHER): Payer: Self-pay | Admitting: Surgery

## 2012-01-25 VITALS — BP 124/80 | HR 70 | Temp 98.4°F | Resp 16 | Ht 63.0 in | Wt 105.6 lb

## 2012-01-25 DIAGNOSIS — Z09 Encounter for follow-up examination after completed treatment for conditions other than malignant neoplasm: Secondary | ICD-10-CM

## 2012-01-25 NOTE — Patient Instructions (Signed)
We will see you again on an as needed basis. Please call the office at [phone] if you have any questions or concerns. Thank you for allowing us to take care of you.  

## 2012-01-25 NOTE — Progress Notes (Signed)
NAME: Sylvia [name]                                            DOB: 1926-03-09 DATE: 01/25/2012                                                  MRN: [id number]  CC: Post op   HPI: This patient comes in for post op follow-up .Sheunderwent right temporar artery biospy on 12/30/11. She feels that she is doing well.  PE:  VITAL SIGNS: BP 124/80  Pulse 70  Temp 98.4 F (36.9 C) (Temporal)  Resp 16  Ht 5\' 3"  (1.6 m)  Wt 105 lb 9.6 oz (47.9 kg)  BMI 18.71 kg/m2  General: The patient appears to be healthy, NAD Incision healing nicely  DATA REVIEWED: Path normal Temporal artery  IMPRESSION: The patient is doing well S/P TA Bx.    PLAN: RTC PRN. Gave her a copy of the path.

## 2012-05-04 ENCOUNTER — Other Ambulatory Visit: Payer: Self-pay | Admitting: Rheumatology

## 2012-05-04 ENCOUNTER — Other Ambulatory Visit: Payer: Self-pay | Admitting: Family Medicine

## 2012-05-04 DIAGNOSIS — R918 Other nonspecific abnormal finding of lung field: Secondary | ICD-10-CM

## 2012-05-05 ENCOUNTER — Other Ambulatory Visit (HOSPITAL_COMMUNITY): Payer: Self-pay | Admitting: Family Medicine

## 2012-05-05 ENCOUNTER — Ambulatory Visit
Admission: RE | Admit: 2012-05-05 | Discharge: 2012-05-05 | Disposition: A | Discharge status: Home / Self Care | Payer: Medicare Other | Source: Ambulatory Visit | Attending: Family Medicine | Admitting: Family Medicine

## 2012-05-05 ENCOUNTER — Ambulatory Visit
Admission: RE | Admit: 2012-05-05 | Discharge: 2012-05-05 | Disposition: A | Discharge status: Home / Self Care | Payer: Medicare Other | Source: Ambulatory Visit | Attending: Rheumatology | Admitting: Rheumatology

## 2012-05-05 DIAGNOSIS — R600 Localized edema: Secondary | ICD-10-CM

## 2012-05-05 DIAGNOSIS — R918 Other nonspecific abnormal finding of lung field: Secondary | ICD-10-CM

## 2012-05-05 MED ORDER — IOHEXOL 300 MG/ML  SOLN
75.0000 mL | Freq: Once | INTRAMUSCULAR | Status: AC | PRN
  Administered 2012-05-05: 75 mL via INTRAVENOUS

## 2012-05-08 ENCOUNTER — Other Ambulatory Visit: Payer: Medicare Other

## 2012-05-11 ENCOUNTER — Encounter (HOSPITAL_COMMUNITY): Payer: Medicare Other

## 2012-05-18 ENCOUNTER — Encounter: Payer: Medicare Other | Admitting: Thoracic Surgery (Cardiothoracic Vascular Surgery)

## 2012-05-18 ENCOUNTER — Encounter (HOSPITAL_COMMUNITY): Payer: Self-pay

## 2012-05-18 ENCOUNTER — Institutional Professional Consult (permissible substitution) (INDEPENDENT_AMBULATORY_CARE_PROVIDER_SITE_OTHER): Payer: Medicare Other | Admitting: Cardiothoracic Surgery

## 2012-05-18 ENCOUNTER — Encounter: Payer: Self-pay | Admitting: Cardiothoracic Surgery

## 2012-05-18 ENCOUNTER — Encounter (HOSPITAL_COMMUNITY)
Admission: RE | Admit: 2012-05-18 | Discharge: 2012-05-18 | Disposition: A | Discharge status: Home / Self Care | Payer: Medicare Other | Source: Ambulatory Visit | Attending: Family Medicine | Admitting: Family Medicine

## 2012-05-18 ENCOUNTER — Other Ambulatory Visit: Payer: Self-pay

## 2012-05-18 VITALS — BP 116/51 | HR 54 | Resp 18 | Ht 63.0 in | Wt 103.0 lb

## 2012-05-18 DIAGNOSIS — R918 Other nonspecific abnormal finding of lung field: Secondary | ICD-10-CM

## 2012-05-18 DIAGNOSIS — C3410 Malignant neoplasm of upper lobe, unspecified bronchus or lung: Secondary | ICD-10-CM | POA: Insufficient documentation

## 2012-05-18 DIAGNOSIS — R222 Localized swelling, mass and lump, trunk: Secondary | ICD-10-CM | POA: Insufficient documentation

## 2012-05-18 MED ORDER — FLUDEOXYGLUCOSE F - 18 (FDG) INJECTION
14.1000 | Freq: Once | INTRAVENOUS | Status: AC | PRN
  Administered 2012-05-18: 14.1 via INTRAVENOUS

## 2012-05-18 NOTE — Progress Notes (Signed)
[address]            [place] [address]          [phone]      Sylvia [name] [facility] Medical Record #[id number] Date of Birth: 1926/02/25  Referring: Lupita Raider, MD Primary Care: Lupita Raider, MD  Chief Complaint:    Chief Complaint  Patient presents with  . Lung Lesion    [facility]/ surgical eval on Pulmonary nodules, PET Scan 05/18/12, Chest CT 05/05/12    History of Present Illness:    Patient referred to thoracic surgery after CT scan revealed a large right upper lobe lung mass. The patient had noticed worsening headache weakness shortness of breath cough and painful joints for 4-6 months. Her sed level was noted to be greater than 140. Last week a chest x-ray was performed it showed a large mass and the patient was referred for CT scan of the chest. She has been a smoker in the past but has not smoked for the past 3-4 years. At age 79 she continues to work at [employer], previously she worked at [employer].      Current Activity/ Functional Status:  Patient is independent with mobility/ambulation, transfers, ADL's, IADL's.  Zubrod Score: At the time of surgery this patient's most appropriate activity status/level should be described as: []  Normal activity, no symptoms [x]  Symptoms, fully ambulatory []  Symptoms, in bed less than or equal to 50% of the time []  Symptoms, in bed greater than 50% of the time but less than 100% []  Bedridden []  Moribund   Past Medical History  Diagnosis Date  . HTN (hypertension)   . Renal insufficiency   . IBS (irritable bowel syndrome)   . Anemia 06/2010  . Raynaud's syndrome   . PVD (peripheral vascular disease)   . Carotid stenosis   . Raynaud phenomenon since 1990's  . Hoarseness of voice   . PVD (peripheral vascular disease)     Past Surgical History  Procedure Laterality Date  . Colonoscopy  02/2004 and 05/2011    last colonoscopy in April 2013 was negative by Dr. Laural Benes  .  Hemorrhoid surgery    . Breast surgery      lumpectomy- right  . Hemorrhoid surgery    . Artery biopsy  12/30/2011    Procedure: MINOR BIOPSY TEMPORAL ARTERY;  Surgeon: Currie Paris, MD;  Location:  SURGERY CENTER;  Service: General;  Laterality: Right;  temoral artery biopsy -right side    Family History  Problem Relation Age of Onset  . Pneumonia Mother   . Stroke Father     History   Social History  . Marital Status: Widowed    Spouse Name: N/A    Number of Children: 4  . Years of Education: N/A   Occupational History  .      retired from [employer].  Currently works at [employer] History Main Topics  . Smoking status: Former Smoker -- 0.25 packs/day for 60 years  . Smokeless tobacco: Not on file  . Alcohol Use: No  . Drug Use: No  . Sexually Active: No     History  Smoking status  . Former Smoker -- 0.25 packs/day for 60 years  Smokeless tobacco  . Not on file    History  Alcohol Use No     No Known Allergies  Current Outpatient Prescriptions  Medication Sig Dispense Refill  . BENICAR HCT 40-12.5 MG per tablet Take 1 tablet by mouth daily.       Marland Kitchen BIOTIN PO Take by mouth daily.      . felodipine (PLENDIL) 10 MG 24 hr tablet       . ferrous sulfate 325 (65 FE) MG tablet Take 325 mg by mouth daily with breakfast.      . fish oil-omega-3 fatty acids 1000 MG capsule Take 1 g by mouth daily.      . vitamin C (ASCORBIC ACID) 500 MG tablet Take 500 mg by mouth daily.      Marland Kitchen VITAMIN D, CHOLECALCIFEROL, PO Take by mouth daily.      . Vitamin Mixture (VITAMIN E COMPLETE) CAPS Take by mouth daily.       No current facility-administered medications for this visit.       Review of Systems:     Cardiac Review of Systems: Y or N  Chest Pain [  n  ]  Resting SOB [ n  ] Exertional SOB  Cove.Etienne  ]  Orthopnea [  n]   Pedal Edema [   ]    Palpitations Milo.Brash  ] Syncope  [ n ]   Presyncope [n   ]  General Review of Systems: [Y] = yes [  ]=no Constitional:  recent weight change [ y ]; anorexia [  ]; fatigue [ y ]; nausea [  ]; night sweats [  ]; fever [n  ]; or chills [ n ];                                                                                                                                          Dental: poor dentition[  n]; Last Dentist visit:   Eye : blurred vision [  ]; diplopia [   ]; vision changes [  ];  Amaurosis fugax[  ]; Resp: cough Cove.Etienne  ];  wheezing[n  ];  hemoptysis[n  ]; shortness of breath[y  ]; paroxysmal nocturnal dyspnea[ y ]; dyspnea on exertion[  ]; or orthopnea[  ];  GI:  gallstones[  ], vomiting[  ];  dysphagia[  ]; melena[  ];  hematochezia [  ]; heartburn[  ];   Hx of  Colonoscopy[  ]; GU: kidney stones [  ]; hematuria[  ];   dysuria [  ];  nocturia[  ];  history of     obstruction [  ]; urinary frequency [  ]             Skin: rash, swelling[  ];, hair loss[  ];  peripheral edema[  ];  or itching[  ]; Musculosketetal: myalgias[  ];  joint swelling[  ];  joint erythema[  ];  joint pain[  ];  back pain[  ];  Heme/Lymph: bruising[  ];  bleeding[  ];  anemia[  ];  Neuro: TIA[  ];  headaches[  ];  stroke[  ];  vertigo[  ];  seizures[  ];   paresthesias[  ];  difficulty walking[  ];  Psych:depression[  ]; anxiety[  ];  Endocrine: diabetes[  ];  thyroid dysfunction[  ];  Immunizations: Flu [?  ]; Pneumococcal[?  ];  Other:  Physical Exam: BP 116/51  Pulse 54  Resp 18  Ht 5\' 3"  (1.6 m)  Wt 103 lb (46.72 kg)  BMI 18.25 kg/m2  SpO2 98%  General appearance: alert, cooperative, appears stated age and no distress Neurologic: intact, she has bilateral carotid bruits Heart: regular rate and rhythm, S1, S2 normal, patient has murmur of aortic stenosis, click, rub or gallop and normal apical impulse Lungs: diminished breath sounds RUL Abdomen: soft, non-tender; bowel sounds normal; no masses,  no organomegaly Extremities: extremities normal, atraumatic, no cyanosis  and Homans sign is negative, no sign of DVT by Doppler,  the patient does have slight edema in the left leg compared to the right.    Diagnostic Studies & Laboratory data:     Recent Radiology Findings:   Nm Pet Image Restag (ps) Skull Base To Thigh  05/18/2012  *RADIOLOGY REPORT*  Clinical Data: Initial treatment strategy for right lung mass.  NUCLEAR MEDICINE PET SKULL BASE TO THIGH  Fasting Blood Glucose:  111  Technique:  14.1 mCi F-18 FDG was injected intravenously. CT data was obtained and used for attenuation correction and anatomic localization only.  (This was not acquired as a diagnostic CT examination.) Additional exam technical data entered on technologist worksheet.  Comparison:  Chest CT on 05/05/2012  Findings:  Neck: A 1.1 cm hypermetabolic upper visceral lymph node is seen on the right at level VI.  This has a maximum SUV of 8.7.  No other hypermetabolic lymph nodes seen within the neck.  Chest:  Large approximately 5 cm mass in the posterior right upper lobe has a maximum SUV of 15.5.  A small to moderate right pleural effusion is seen, although no hypermetabolic activity is seen.  2 cm ground-glass nodule in the superior segment of the left lower lobe shows minimal hypermetabolic activity with maximum SUV of 2.2. This is suspicious for low grade adenocarcinoma.  No hypermetabolic lymphadenopathy identified within the thorax.  Abdomen/Pelvis:  No abnormal hypermetabolic activity within the liver, pancreas, adrenal glands, or spleen.  No hypermetabolic lymph nodes in the abdomen or pelvis.  Skeleton:  No focal hypermetabolic activity to suggest skeletal metastasis.  IMPRESSION:  1.  5 cm hypermetabolic mass in the posterior upper lobe, consistent with primary bronchogenic carcinoma.  Small to moderate right pleural effusion shows no hypermetabolic activity, however a malignant pleural effusion cannot be excluded. Diagnostic thoracentesis is recommended. 2.  1 cm hypervascular right upper visceral cervical lymph node at level VI, suspicious for  metastatic disease. 3.  2 cm ground-glass nodule in the superior left lower lobe with minimal hypermetabolic activity, suspicious for synchronous low grade bronchogenic adenocarcinoma.   Original Report Authenticated By: Myles Rosenthal, M.D.       Recent Lab Findings: Lab Results  Component Value Date   WBC 5.9 07/07/2011   HGB 11.0* 07/07/2011   HCT 33.5* 07/07/2011   PLT 474* 07/07/2011   GLUCOSE 108* 06/07/2011   ALT 12 06/07/2011   AST 21 06/07/2011   NA 134* 06/07/2011   K 4.5 06/07/2011   CL 96 06/07/2011   CREATININE 0.98 06/07/2011   BUN 32* 06/07/2011   CO2 27  06/07/2011      Assessment / Plan:    1) Patient has been seen CT scan and PET scan have been reviewed, from a clinical standpoint and radiographically the patient appears to have stage IV lung cancer cT2b, cN3, cM1a I discussed this diagnosis with the patient and her daughter in detail. I recommended that we proceed with an MRI of the brain, a combined interventional radiology needle biopsy of the large right upper lobe lung mass with testing for genetic markers and sampling of the moderate size right pleural effusion with cytologic evaluation.  After these tests are performed the patient will return to the [facility] to review the pathologic diagnosis and planned treatments. 2) murmur of aortic stenosis, patient also has significant calcification of her aorta and coronary arteries, without symptoms 3) bilateral carotid bruits, she's been followed by Dr. Tonie Griffith MD  Beeper [phone] Office [phone] 05/18/2012 5:36 PM

## 2012-05-19 ENCOUNTER — Other Ambulatory Visit: Payer: Self-pay

## 2012-05-19 ENCOUNTER — Ambulatory Visit
Admission: RE | Admit: 2012-05-19 | Discharge: 2012-05-19 | Disposition: A | Discharge status: Home / Self Care | Payer: Medicare Other | Source: Ambulatory Visit | Attending: Cardiothoracic Surgery | Admitting: Cardiothoracic Surgery

## 2012-05-19 ENCOUNTER — Encounter: Payer: Medicare Other | Admitting: Cardiothoracic Surgery

## 2012-05-19 DIAGNOSIS — J90 Pleural effusion, not elsewhere classified: Secondary | ICD-10-CM

## 2012-05-19 DIAGNOSIS — R51 Headache: Secondary | ICD-10-CM

## 2012-05-19 DIAGNOSIS — D381 Neoplasm of uncertain behavior of trachea, bronchus and lung: Secondary | ICD-10-CM

## 2012-05-22 ENCOUNTER — Inpatient Hospital Stay: Admission: RE | Admit: 2012-05-22 | Payer: Medicare Other | Source: Ambulatory Visit

## 2012-05-22 ENCOUNTER — Other Ambulatory Visit: Payer: Self-pay | Admitting: Radiology

## 2012-05-23 ENCOUNTER — Encounter (HOSPITAL_COMMUNITY): Payer: Self-pay

## 2012-05-25 ENCOUNTER — Other Ambulatory Visit: Payer: Self-pay | Admitting: Cardiothoracic Surgery

## 2012-05-25 ENCOUNTER — Ambulatory Visit (HOSPITAL_COMMUNITY)
Admission: RE | Admit: 2012-05-25 | Discharge: 2012-05-25 | Disposition: A | Discharge status: Home / Self Care | Payer: Medicare Other | Source: Ambulatory Visit | Attending: Cardiothoracic Surgery | Admitting: Cardiothoracic Surgery

## 2012-05-25 ENCOUNTER — Encounter (HOSPITAL_COMMUNITY): Payer: Self-pay

## 2012-05-25 ENCOUNTER — Ambulatory Visit (HOSPITAL_COMMUNITY)
Admission: RE | Admit: 2012-05-25 | Discharge: 2012-05-25 | Disposition: A | Discharge status: Home / Self Care | Payer: Medicare Other | Source: Ambulatory Visit | Attending: Radiology | Admitting: Radiology

## 2012-05-25 ENCOUNTER — Encounter: Payer: Medicare Other | Admitting: Cardiothoracic Surgery

## 2012-05-25 ENCOUNTER — Other Ambulatory Visit: Payer: Medicare Other

## 2012-05-25 DIAGNOSIS — N289 Disorder of kidney and ureter, unspecified: Secondary | ICD-10-CM | POA: Insufficient documentation

## 2012-05-25 DIAGNOSIS — K589 Irritable bowel syndrome without diarrhea: Secondary | ICD-10-CM | POA: Insufficient documentation

## 2012-05-25 DIAGNOSIS — C3410 Malignant neoplasm of upper lobe, unspecified bronchus or lung: Secondary | ICD-10-CM | POA: Insufficient documentation

## 2012-05-25 DIAGNOSIS — I7300 Raynaud's syndrome without gangrene: Secondary | ICD-10-CM | POA: Insufficient documentation

## 2012-05-25 DIAGNOSIS — I739 Peripheral vascular disease, unspecified: Secondary | ICD-10-CM | POA: Insufficient documentation

## 2012-05-25 DIAGNOSIS — J90 Pleural effusion, not elsewhere classified: Secondary | ICD-10-CM | POA: Insufficient documentation

## 2012-05-25 DIAGNOSIS — D381 Neoplasm of uncertain behavior of trachea, bronchus and lung: Secondary | ICD-10-CM

## 2012-05-25 DIAGNOSIS — I10 Essential (primary) hypertension: Secondary | ICD-10-CM | POA: Insufficient documentation

## 2012-05-25 DIAGNOSIS — J984 Other disorders of lung: Secondary | ICD-10-CM

## 2012-05-25 DIAGNOSIS — I6529 Occlusion and stenosis of unspecified carotid artery: Secondary | ICD-10-CM | POA: Insufficient documentation

## 2012-05-25 DIAGNOSIS — R490 Dysphonia: Secondary | ICD-10-CM | POA: Insufficient documentation

## 2012-05-25 LAB — CBC
Platelets: 453 10*3/uL — ABNORMAL HIGH (ref 150–400)
RBC: 2.93 MIL/uL — ABNORMAL LOW (ref 3.87–5.11)
WBC: 6.1 10*3/uL (ref 4.0–10.5)

## 2012-05-25 LAB — PROTIME-INR
INR: 1.07 (ref 0.00–1.49)
Prothrombin Time: 13.8 seconds (ref 11.6–15.2)

## 2012-05-25 LAB — APTT: aPTT: 35 seconds (ref 24–37)

## 2012-05-25 MED ORDER — MIDAZOLAM HCL 2 MG/2ML IJ SOLN
INTRAMUSCULAR | Status: AC | PRN
  Administered 2012-05-25: 0.5 mg via INTRAVENOUS
  Administered 2012-05-25: 1 mg via INTRAVENOUS

## 2012-05-25 MED ORDER — FENTANYL CITRATE 0.05 MG/ML IJ SOLN
INTRAMUSCULAR | Status: AC
  Filled 2012-05-25: qty 4

## 2012-05-25 MED ORDER — MIDAZOLAM HCL 2 MG/2ML IJ SOLN
INTRAMUSCULAR | Status: AC
  Filled 2012-05-25: qty 4

## 2012-05-25 MED ORDER — SODIUM CHLORIDE 0.9 % IV SOLN: Freq: Once | INTRAVENOUS | Status: DC

## 2012-05-25 MED ORDER — FENTANYL CITRATE 0.05 MG/ML IJ SOLN
INTRAMUSCULAR | Status: AC | PRN
  Administered 2012-05-25 (×2): 25 ug via INTRAVENOUS

## 2012-05-25 NOTE — H&P (Signed)
Chief Complaint: "I'm here for a biopsy" Referring Physician:Gerhardt HPI: Sylvia [name] is an 79 y.o. female with finding of a right lung mass. She also has pleural effusion associated with it. She is referred to IR foe thoracentesis and biopsy. PMHx and meds reviewed. She feels well this am, no recent illness, fevers.  Past Medical History:  Past Medical History  Diagnosis Date  . HTN (hypertension)   . Renal insufficiency   . IBS (irritable bowel syndrome)   . Anemia 06/2010  . Raynaud's syndrome   . PVD (peripheral vascular disease)   . Carotid stenosis   . Raynaud phenomenon since 1990's  . Hoarseness of voice   . PVD (peripheral vascular disease)     Past Surgical History:  Past Surgical History  Procedure Laterality Date  . Colonoscopy  02/2004 and 05/2011    last colonoscopy in April 2013 was negative by Dr. Laural Benes  . Hemorrhoid surgery    . Breast surgery      lumpectomy- right  . Hemorrhoid surgery    . Artery biopsy  12/30/2011    Procedure: MINOR BIOPSY TEMPORAL ARTERY;  Surgeon: Currie Paris, MD;  Location:  SURGERY CENTER;  Service: General;  Laterality: Right;  temoral artery biopsy -right side    Family History:  Family History  Problem Relation Age of Onset  . Pneumonia Mother   . Stroke Father     Social History:  reports that she has quit smoking. She does not have any smokeless tobacco history on file. She reports that she does not drink alcohol or use illicit drugs.  Allergies: No Known Allergies  Medications: BENICAR HCT 40-12.5 MG per tablet 06/02/2011 Sig - Route: Take 1 tablet by mouth daily. - Oral Class: Historical Med Number of times this order has been changed since signing: 3 Order [address] felodipine (PLENDIL) 10 MG 24 hr tablet 05/31/2011 Sig - Route: Take 10 mg by mouth daily. - Oral Class: Historical Med Number of times this order has been changed since signing: 3 Order [address] fish oil-omega-3 fatty acids 1000 MG  capsule Sig - Route: Take 1 g by mouth daily. - Oral Class: Historical Med Number of times this order has been changed since signing: 1 Order [address] VITAMIN D, CHOLECALCIFEROL, PO Sig - Route: Take 1 tablet by mouth daily. - Oral Class: Historical Med Number of times this order has been changed since signing: 2 Order [address] Vitamin Mixture (VITAMIN E COMPLETE) CAPS Sig - Route: Take 1 capsule    Please HPI for pertinent positives, otherwise complete 10 system ROS negative.  Physical Exam: Blood pressure 194/53, pulse 71, temperature 97.7 F (36.5 C), temperature source Oral, resp. rate 18, height 5\' 3"  (1.6 m), weight 104 lb (47.174 kg), SpO2 95.00%. Body mass index is 18.43 kg/(m^2).   General Appearance:  Alert, cooperative, no distress, appears stated age  Head:  Normocephalic, without obvious abnormality, atraumatic  ENT: Unremarkable  Neck: Supple, symmetrical, trachea midline  Lungs:   Clear on left. Decreased BS right base, few rhonchi  Chest Wall:  No tenderness or deformity  Heart:  Regular rate and rhythm, S1, S2 normal, no murmur, rub or gallop.   Neurologic: Normal affect, no gross deficits.   No results found for this or any previous visit (from the past 48 hour(s)). No results found.  Assessment/Plan Right lung mass with pleural effusion. Discussed plan for Rt Thora and mass biopsy. Explained procedure, risks, complications including bleeding, hemoptysis, PTX  and possible need for chest tube and admission. Labs pending Consent signed in chart  Brayton El PA-C 05/25/2012, 8:13 AM

## 2012-05-25 NOTE — Procedures (Signed)
Successful US guided right thoracentesis. Yielded of blood tinged fluid. Pt tolerated procedure well. No immediate complications.  Specimen was sent for labs. CXR ordered.  Brayton El PA-C 05/25/2012 12:45 PM

## 2012-05-25 NOTE — ED Notes (Signed)
Short stay notified for bed 

## 2012-05-25 NOTE — Procedures (Signed)
CT core RUL lesion 18g x2 No ptx.  No complication No blood loss. See complete dictation in [facility].

## 2012-06-01 ENCOUNTER — Encounter: Payer: Self-pay | Admitting: *Deleted

## 2012-06-01 ENCOUNTER — Ambulatory Visit
Admission: RE | Admit: 2012-06-01 | Discharge: 2012-06-01 | Disposition: A | Discharge status: Home / Self Care | Payer: Medicare Other | Source: Ambulatory Visit | Attending: Radiation Oncology | Admitting: Radiation Oncology

## 2012-06-01 ENCOUNTER — Institutional Professional Consult (permissible substitution) (INDEPENDENT_AMBULATORY_CARE_PROVIDER_SITE_OTHER): Payer: Medicare Other | Admitting: Cardiothoracic Surgery

## 2012-06-01 ENCOUNTER — Ambulatory Visit (HOSPITAL_BASED_OUTPATIENT_CLINIC_OR_DEPARTMENT_OTHER): Payer: Medicare Other | Admitting: Internal Medicine

## 2012-06-01 ENCOUNTER — Encounter: Payer: Self-pay | Admitting: Internal Medicine

## 2012-06-01 ENCOUNTER — Telehealth: Payer: Self-pay | Admitting: Internal Medicine

## 2012-06-01 DIAGNOSIS — C3410 Malignant neoplasm of upper lobe, unspecified bronchus or lung: Secondary | ICD-10-CM

## 2012-06-01 DIAGNOSIS — C801 Malignant (primary) neoplasm, unspecified: Secondary | ICD-10-CM

## 2012-06-01 DIAGNOSIS — C3490 Malignant neoplasm of unspecified part of unspecified bronchus or lung: Secondary | ICD-10-CM | POA: Insufficient documentation

## 2012-06-01 NOTE — Progress Notes (Deleted)
[address]            [place] [address]          [phone]      Tine Mabee [facility] Medical Record #[id number] Date of Birth: 1926/04/21  Referring: Lupita Raider, MD Primary Care: Lupita Raider, MD  Chief Complaint:    No chief complaint on file.   History of Present Illness:    Patient referred to thoracic surgery after CT scan revealed a large right upper lobe lung mass. The patient had noticed worsening headache weakness shortness of breath cough and painful joints for 4-6 months. Her sed level was noted to be greater than 140. Last week a chest x-ray was performed it showed a large mass and the patient was referred for CT scan of the chest. She has been a smoker in the past but has not smoked for the past 3-4 years. At age 4 she continues to work at [employer], previously she worked at [employer].      Current Activity/ Functional Status:  Patient is independent with mobility/ambulation, transfers, ADL's, IADL's.  Zubrod Score: At the time of surgery this patient's most appropriate activity status/level should be described as: []  Normal activity, no symptoms [x]  Symptoms, fully ambulatory []  Symptoms, in bed less than or equal to 50% of the time []  Symptoms, in bed greater than 50% of the time but less than 100% []  Bedridden []  Moribund   Past Medical History  Diagnosis Date  . HTN (hypertension)   . Renal insufficiency   . IBS (irritable bowel syndrome)   . Anemia 06/2010  . Raynaud's syndrome   . PVD (peripheral vascular disease)   . Carotid stenosis   . Raynaud phenomenon since 1990's  . Hoarseness of voice   . PVD (peripheral vascular disease)     Past Surgical History  Procedure Laterality Date  . Colonoscopy  02/2004 and 05/2011    last colonoscopy in April 2013 was negative by Dr. Laural Benes  . Hemorrhoid surgery    . Breast surgery      lumpectomy- right  . Hemorrhoid surgery    . Artery biopsy   12/30/2011    Procedure: MINOR BIOPSY TEMPORAL ARTERY;  Surgeon: Currie Paris, MD;  Location: [facility];  Service: General;  Laterality: Right;  temoral artery biopsy -right side    Family History  Problem Relation Age of Onset  . Pneumonia Mother   . Stroke Father     History   Social History  . Marital Status: Widowed    Spouse Name: N/A    Number of Children: 4  . Years of Education: N/A   Occupational History  .      retired from [employer].  Currently works at [employer] History Main Topics  . Smoking status: Former Smoker -- 0.25 packs/day for 60 years  . Smokeless tobacco: Not on file  . Alcohol Use: No  . Drug Use: No  . Sexually Active: No     History  Smoking status  . Former Smoker -- 0.25 packs/day for 60 years  Smokeless tobacco  . Not on file    History  Alcohol Use No     No Known Allergies  Current Outpatient Prescriptions  Medication Sig Dispense Refill  . Ascorbic Acid (VITAMIN C PO) Take 1 tablet by mouth daily.      Marland Kitchen  BENICAR HCT 40-12.5 MG per tablet Take 1 tablet by mouth daily.       . felodipine (PLENDIL) 10 MG 24 hr tablet Take 10 mg by mouth daily.       . fish oil-omega-3 fatty acids 1000 MG capsule Take 1 g by mouth daily.      . IRON PO Take 1 tablet by mouth daily.      . Multiple Vitamin (MULTIVITAMIN WITH MINERALS) TABS Take 1 tablet by mouth daily.      Marland Kitchen VITAMIN D, CHOLECALCIFEROL, PO Take 1 tablet by mouth daily.       . Vitamin Mixture (VITAMIN E COMPLETE) CAPS Take 1 capsule by mouth daily.        No current facility-administered medications for this visit.       Review of Systems:     Cardiac Review of Systems: Y or N  Chest Pain [  n  ]  Resting SOB [ n  ] Exertional SOB  Cove.Etienne  ]  Orthopnea [  n]   Pedal Edema [   ]    Palpitations Milo.Brash  ] Syncope  [ n ]   Presyncope [n   ]  General Review of Systems: [Y] = yes [  ]=no Constitional: recent weight change [ y ]; anorexia [  ]; fatigue [ y ]; nausea  [  ]; night sweats [  ]; fever [n  ]; or chills [ n ];                                                                                                                                          Dental: poor dentition[  n]; Last Dentist visit:   Eye : blurred vision [  ]; diplopia [   ]; vision changes [  ];  Amaurosis fugax[  ]; Resp: cough Cove.Etienne  ];  wheezing[n  ];  hemoptysis[n  ]; shortness of breath[y  ]; paroxysmal nocturnal dyspnea[ y ]; dyspnea on exertion[  ]; or orthopnea[  ];  GI:  gallstones[  ], vomiting[  ];  dysphagia[  ]; melena[  ];  hematochezia [  ]; heartburn[  ];   Hx of  Colonoscopy[  ]; GU: kidney stones [  ]; hematuria[  ];   dysuria [  ];  nocturia[  ];  history of     obstruction [  ]; urinary frequency [  ]             Skin: rash, swelling[  ];, hair loss[  ];  peripheral edema[  ];  or itching[  ]; Musculosketetal: myalgias[  ];  joint swelling[  ];  joint erythema[  ];  joint pain[  ];  back pain[  ];  Heme/Lymph: bruising[  ];  bleeding[  ];  anemia[  ];  Neuro: TIA[  ];  headaches[  ];  stroke[  ];  vertigo[  ];  seizures[  ];   paresthesias[  ];  difficulty walking[  ];  Psych:depression[  ]; anxiety[  ];  Endocrine: diabetes[  ];  thyroid dysfunction[  ];  Immunizations: Flu [?  ]; Pneumococcal[?  ];  Other:  Physical Exam: There were no vitals taken for this visit.  General appearance: alert, cooperative, appears stated age and no distress Neurologic: intact, she has bilateral carotid bruits Heart: regular rate and rhythm, S1, S2 normal, patient has murmur of aortic stenosis, click, rub or gallop and normal apical impulse Lungs: diminished breath sounds RUL Abdomen: soft, non-tender; bowel sounds normal; no masses,  no organomegaly Extremities: extremities normal, atraumatic, no cyanosis  and Homans sign is negative, no sign of DVT by Doppler, the patient does have slight edema in the left leg compared to the right.    Diagnostic Studies & Laboratory data:      Recent Radiology Findings:   No results found.    Recent Lab Findings: Lab Results  Component Value Date   WBC 6.1 05/25/2012   HGB 7.9* 05/25/2012   HCT 23.2* 05/25/2012   PLT 453* 05/25/2012   GLUCOSE 108* 06/07/2011   ALT 12 06/07/2011   AST 21 06/07/2011   NA 134* 06/07/2011   K 4.5 06/07/2011   CL 96 06/07/2011   CREATININE 0.98 06/07/2011   BUN 32* 06/07/2011   CO2 27 06/07/2011   INR 1.07 05/25/2012      Assessment / Plan:    1) Patient has been seen CT scan and PET scan have been reviewed, from a clinical standpoint and radiographically the patient appears to have stage IV lung cancer cT2b, cN3, cM1a I discussed this diagnosis with the patient and her daughter in detail. I recommended that we proceed with an MRI of the brain, a combined interventional radiology needle biopsy of the large right upper lobe lung mass with testing for genetic markers and sampling of the moderate size right pleural effusion with cytologic evaluation.  After these tests are performed the patient will return to the [facility] to review the pathologic diagnosis and planned treatments. 2) murmur of aortic stenosis, patient also has significant calcification of her aorta and coronary arteries, without symptoms 3) bilateral carotid bruits, she's been followed by Dr. Tonie Griffith MD  Beeper [phone] Office [phone] 06/01/2012 5:41 PM

## 2012-06-01 NOTE — Progress Notes (Signed)
   Thoracic Treatment Summary Name: Jrue Yambao Date: 06/01/12 DOB: January 31, 2027 Your Medical Team Medical Oncologist: Dr. Arbutus Ped Radiation Oncologist: Dr. Mitzi Hansen Pulmonologist: Surgeon: Dr. Tyrone Sage Type and Stage of Lung Cancer Non-Small Cell Carcinoma: Adenocarcinoma Clinical Stage:  IV Pathological Stage:  Clinical stage is based on radiology exams.  Pathological stage will be determined after surgery.  Staging is based on the size of the tumor, involvement of lymph nodes or not, and whether or not the cancer center has spread. Recommendations Recommendations: Radiationtherapy 3 weeks.  EGFR/ALK testing to be completed.  Appt with Dr. Arbutus Ped after radiation.  Chemo or biologic meds. Chemo class  These recommendations are based on information available as of today's consult.  This is subject to change depending further testing or exams. Next Steps Next Step: 1. Cancer Center will call with appt's   Barriers to Care What do you perceive as a potential barrier that may prevent you from receiving your treatment plan? Pt does not see any barriers at this time Questions Willette Pa, RN BSN Thoracic Oncology Nurse Navigator at [phone]  Annabelle Harman is a nurse navigator that is available to assist you through your cancer journey.  She can answer your questions and/or provide resources regarding your treatment plan, emotional support, or financial concerns.

## 2012-06-01 NOTE — Telephone Encounter (Signed)
lvm for pt regarding to 5.8.14 appt...mailed pt May appt calander

## 2012-06-01 NOTE — Progress Notes (Signed)
[facility] [phone]   [phone] Multidisciplinary thoracic oncology clinic (MTOC)  CONSULT NOTE  REFERRING PHYSICIAN: Dr. Ramon Dredge B. Gerhardt  REASON FOR CONSULTATION:  79 years old white female recently diagnosed with lung cancer.  HPI Sylvia [name] is a 79 y.o. female with past medical history significant for hypertension, irritable bowel syndrome, anemia, peripheral vascular disease, history of hemorrhoids, carotid stenosis as well as long history of smoking but quit 3 years ago. The patient mentions that in early March of 2014 she has been complaining of increasing fatigue and weakness as well as neck pain and cough. CT scan of the chest was performed in 05/05/2012 and it showed an inhomogenous spiculated mass in the posterior aspect of the right upper lobe abutting the major fissure measuring approximately 8.0x5.5x7.5 CM. The mass extends into the right hilum and encases the azygos vein and the right pulmonary arteries. There was a 7 mm precarinal lymph node as well as 11 mm superior mediastinal lymph node. There was another mass in the left lower lobe measuring 2.1 x 1.8 x 1.5 CM suspicious for second primary tumor. A PET scan was performed on 05/18/2012 and it showed 5 cm hypermetabolic mass in the posterior upper lobe, consistent with primary bronchogenic carcinoma. Small to moderate right pleural effusion shows no hypermetabolic activity, however a  malignant pleural effusion cannot be excluded. Diagnostic thoracentesis is recommended. 1 cm hypervascular right upper visceral cervical lymph node at  level VI, suspicious for metastatic disease. 2 cm ground-glass nodule in the superior left lower lobe with minimal hypermetabolic activity, suspicious for synchronous low grade bronchogenic adenocarcinoma. On 05/20/2012 the patient had a brain MRI and it showed no evidence of intracranial enhancing lesion or bony destructive lesions to suggest the presence  of intracranial metastatic disease. The patient was seen by Dr. Tyrone Sage and he ordered CT guided lung core biopsy which was performed on 05/25/2012 and the final pathology (Accession #: [id number]) showed adenocarcinoma. Immunohistochemistry is performed and the carcinoma is positive with cytokeratin 7 and shows patchy, focal nuclear positivity with thyroid transcription factor-1 (TTF-1). The tumor is negative with CDX2, cytokeratin 20, estrogen receptor, gross cystic disease fluid protein-15, progesterone receptor, Napsin-A and WT-1. The patchy TTF-1 positivity suggests that primary lung carcinoma is a likely possibility. Dr. Tyrone Sage kindly referred the patient to me today for evaluation and recommendation regarding treatment of her condition. The patient is feeling fine today with no specific complaints except for fatigue and headache as well as leg 8. She denied having any significant weight loss or night sweats. She continues to have cough productive of whitish sputum and occasional shortness of breath. The patient is a widow and has 4 children. She was accompanied today by her daughter Sylvia [name] who is occupational therapist in [facility] [place]. The patient is still work part-time at [employer]. She has a history of smoking one pack per day for around 60 years but quit 3 years ago. She drinks vodka on daily basis but no history of drug abuse. @SFHPI @  Past Medical History  Diagnosis Date  . HTN (hypertension)   . Renal insufficiency   . IBS (irritable bowel syndrome)   . Anemia 06/2010  . Raynaud's syndrome   . PVD (peripheral vascular disease)   . Carotid stenosis   . Raynaud phenomenon since 1990's  . Hoarseness of voice   . PVD (peripheral vascular disease)     Past Surgical History  Procedure Laterality Date  . Colonoscopy  02/2004 and 05/2011    last colonoscopy in April 2013 was negative by Dr. Laural Benes  . Hemorrhoid surgery    . Breast surgery      lumpectomy-  right  . Hemorrhoid surgery    . Artery biopsy  12/30/2011    Procedure: MINOR BIOPSY TEMPORAL ARTERY;  Surgeon: Currie Paris, MD;  Location: [facility];  Service: General;  Laterality: Right;  temoral artery biopsy -right side    Family History  Problem Relation Age of Onset  . Pneumonia Mother   . Stroke Father     Social History History  Substance Use Topics  . Smoking status: Former Smoker -- 0.25 packs/day for 60 years  . Smokeless tobacco: Not on file  . Alcohol Use: No    No Known Allergies  Current Outpatient Prescriptions  Medication Sig Dispense Refill  . Ascorbic Acid (VITAMIN C PO) Take 1 tablet by mouth daily.      Marland Kitchen BENICAR HCT 40-12.5 MG per tablet Take 1 tablet by mouth daily.       . felodipine (PLENDIL) 10 MG 24 hr tablet Take 10 mg by mouth daily.       . fish oil-omega-3 fatty acids 1000 MG capsule Take 1 g by mouth daily.      . IRON PO Take 1 tablet by mouth daily.      . Multiple Vitamin (MULTIVITAMIN WITH MINERALS) TABS Take 1 tablet by mouth daily.      Marland Kitchen VITAMIN D, CHOLECALCIFEROL, PO Take 1 tablet by mouth daily.       . Vitamin Mixture (VITAMIN E COMPLETE) CAPS Take 1 capsule by mouth daily.        No current facility-administered medications for this visit.    Review of Systems  A comprehensive review of systems was negative except for: Constitutional: positive for anorexia and fatigue Respiratory: positive for cough, dyspnea on exertion and sputum Musculoskeletal: positive for muscle weakness  Physical Exam  ZOX:WRUEA, healthy, no distress, well nourished and well developed SKIN: skin color, texture, turgor are normal HEAD: Normocephalic, No masses, lesions, tenderness or abnormalities EYES: normal, PERRLA EARS: External ears normal OROPHARYNX:no exudate and no erythema  NECK: supple, no adenopathy LYMPH:  no palpable lymphadenopathy, no hepatosplenomegaly BREAST:not examined LUNGS: clear to auscultation  HEART:  regular rate & rhythm and no murmurs ABDOMEN:abdomen soft, non-tender, normal bowel sounds and no masses or organomegaly BACK: Back symmetric, no curvature. EXTREMITIES:no joint deformities, effusion, or inflammation, no edema, no skin discoloration  NEURO: alert & oriented x 3 with fluent speech, no focal motor/sensory deficits  PERFORMANCE STATUS: ECOG 1  LABORATORY DATA: Lab Results  Component Value Date   WBC 6.1 05/25/2012   HGB 7.9* 05/25/2012   HCT 23.2* 05/25/2012   MCV 79.2 05/25/2012   PLT 453* 05/25/2012      Chemistry      Component Value Date/Time   NA 134* 06/07/2011 1007   K 4.5 06/07/2011 1007   CL 96 06/07/2011 1007   CO2 27 06/07/2011 1007   BUN 32* 06/07/2011 1007   CREATININE 0.98 06/07/2011 1007      Component Value Date/Time   CALCIUM 9.4 06/07/2011 1007   ALKPHOS 70 06/07/2011 1007   AST 21 06/07/2011 1007   ALT 12 06/07/2011 1007   BILITOT 0.4 06/07/2011 1007       RADIOGRAPHIC STUDIES: Dg Chest 1 View  05/25/2012  *RADIOLOGY REPORT*  Clinical Data: Post thoracentesis.  CHEST - 1 VIEW  Comparison: 05/04/2012  Findings: There is no evidence for a pneumothorax.  Again noted is a mass in the right upper chest.  Stable appearance of the heart and mediastinum.  Trachea is midline.  IMPRESSION: No evidence for a pneumothorax following thoracentesis.  Minimal fluid or blunting at the right lung base.  Right lung mass.   Original Report Authenticated By: Richarda Overlie, M.D.    Ct Chest W Contrast  05/05/2012  **ADDENDUM** CREATED: 05/05/2012 16:28:07  There is a 7 mm hypervascular lesion in the anterior aspect of the right lobe of the liver.  This could represent a transient hepatic attenuation difference or a tiny hemangioma.  I cannot exclude metastatic disease but there is no other evidence of metastases in the liver.  **END ADDENDUM** SIGNED BY: Allayne Gitelman. Jena Gauss, M.D.   05/05/2012  *RADIOLOGY REPORT*  Clinical Data: Right lung mass.  CT CHEST WITH CONTRAST  Technique:   Multidetector CT imaging of the chest was performed following the standard protocol during bolus administration of intravenous contrast.  Contrast: 75mL OMNIPAQUE IOHEXOL 300 MG/ML  SOLN  Comparison: Chest x-ray dated 05/04/2012  Findings: There is an inhomogeneous spiculated mass in the posterior aspect of the right upper lobe abutting the major fissure measuring approximately 8.0 x   5.5 x 7.5 cm.  The mass extends into the right hilum and encases the azygos vein and right pulmonary arteries.  There is a 7 mm precarinal lymph node.  There is an 11 mm superior mediastinal lymph node on image number 10.  There is another mass in the left lower lobe measuring 2.1 x 1.8 x 1.5 cm.  This may represent a second primary tumor.  There is moderate right pleural effusion. There is also focal atelectasis in the anterior aspect of the right lower lobe.  Borderline cardiomegaly.  Pulmonary vascularity is normal.  No acute osseous abnormality.  The visualized portion of the upper abdomen is normal.  IMPRESSION:  1.  New large irregular inhomogeneous tumor in the right upper lobe consistent with primary carcinoma of the lung.  The tumor extends into the right hilum and encases the vessels. 2.  Probable metastatic lymph node in the anterior-superior mediastinum. 3.  Probable second primary tumor in the superior aspect of the left lower lobe.   Original Report Authenticated By: Francene Boyers, M.D.    Mr Laqueta Jean ZO Contrast  05/20/2012  *RADIOLOGY REPORT*  Clinical Data: Headaches.  New diagnosis of lung cancer.  MRI HEAD WITHOUT AND WITH CONTRAST  Technique:  Multiplanar, multiecho pulse sequences of the brain and surrounding structures were obtained according to standard protocol without and with intravenous contrast  Contrast:  9 ml MultiHance.  Comparison: 12/09/2011 MR.  01/26/2011 CT.  Findings: No evidence of intracranial enhancing lesion or bony destructive lesion to suggest the presence of intracranial metastatic disease.   No acute infarct.  No intracranial hemorrhage.  Prominent pineal calcification unchanged from remote CT.  Global atrophy.  Ventricular prominence may be related atrophy although difficult to exclude mild component of hydrocephalus. Appearance unchanged.  Mild to moderate small vessel disease type changes.  Opacification mastoid air cells greater on the right.  Small vertebral arteries with possible narrowing of the left vertebral artery.  Remainder of major intracranial vascular structures are patent.  Cervical spondylotic changes with facet joint degenerative changes greater on the left and anterior slip of C3 upon C4 with spinal stenosis and slight cord flattening.  IMPRESSION: No evidence of intracranial enhancing lesion or bony  destructive lesion to suggest the presence of intracranial metastatic disease.  Global atrophy.  Ventricular prominence may be related atrophy although difficult to exclude mild component of hydrocephalus. Appearance unchanged.  Mild to moderate small vessel disease type changes.  Opacification mastoid air cells greater on the right.  Small vertebral arteries with possible narrowing of the left vertebral artery.  Remainder of major intracranial vascular structures are patent.  Cervical spondylotic changes with facet joint degenerative changes greater on the left and anterior slip of C3 upon C4 with spinal stenosis and slight cord flattening.   Original Report Authenticated By: Lacy Duverney, M.D.    Nm Pet Image Restag (ps) Skull Base To Thigh  05/18/2012  *RADIOLOGY REPORT*  Clinical Data: Initial treatment strategy for right lung mass.  NUCLEAR MEDICINE PET SKULL BASE TO THIGH  Fasting Blood Glucose:  111  Technique:  14.1 mCi F-18 FDG was injected intravenously. CT data was obtained and used for attenuation correction and anatomic localization only.  (This was not acquired as a diagnostic CT examination.) Additional exam technical data entered on technologist worksheet.  Comparison:   Chest CT on 05/05/2012  Findings:  Neck: A 1.1 cm hypermetabolic upper visceral lymph node is seen on the right at level VI.  This has a maximum SUV of 8.7.  No other hypermetabolic lymph nodes seen within the neck.  Chest:  Large approximately 5 cm mass in the posterior right upper lobe has a maximum SUV of 15.5.  A small to moderate right pleural effusion is seen, although no hypermetabolic activity is seen.  2 cm ground-glass nodule in the superior segment of the left lower lobe shows minimal hypermetabolic activity with maximum SUV of 2.2. This is suspicious for low grade adenocarcinoma.  No hypermetabolic lymphadenopathy identified within the thorax.  Abdomen/Pelvis:  No abnormal hypermetabolic activity within the liver, pancreas, adrenal glands, or spleen.  No hypermetabolic lymph nodes in the abdomen or pelvis.  Skeleton:  No focal hypermetabolic activity to suggest skeletal metastasis.  IMPRESSION:  1.  5 cm hypermetabolic mass in the posterior upper lobe, consistent with primary bronchogenic carcinoma.  Small to moderate right pleural effusion shows no hypermetabolic activity, however a malignant pleural effusion cannot be excluded. Diagnostic thoracentesis is recommended. 2.  1 cm hypervascular right upper visceral cervical lymph node at level VI, suspicious for metastatic disease. 3.  2 cm ground-glass nodule in the superior left lower lobe with minimal hypermetabolic activity, suspicious for synchronous low grade bronchogenic adenocarcinoma.   Original Report Authenticated By: Myles Rosenthal, M.D.    US Venous Img Lower Unilateral Left  05/05/2012  *RADIOLOGY REPORT*  Clinical Data: Left leg edema  LEFT LOWER EXTREMITY VENOUS DUPLEX ULTRASOUND  Technique:  Gray-scale sonography with graded compression, as well as color Doppler and duplex ultrasound, were performed to evaluate the deep venous system of the lower extremity from the level of the common femoral vein through the popliteal and proximal calf  veins. Spectral Doppler was utilized to evaluate flow at rest and with distal augmentation maneuvers.  Comparison:  None.  Findings: Ultrasound examination of the left lower extremity demonstrate the visualized deep veins to be patent with good augmentation and compressibility noted.  There is no evidence of deep vein thrombosis.  IMPRESSION: No evidence of deep vein thrombosis left lower extremity.   Original Report Authenticated By: Natasha Mead, M.D.    Ct Biopsy  05/25/2012  *RADIOLOGY REPORT*  Clinical data:  Right upper lobe mass  CT-GUIDED LUNG CORE BIOPSY  Comparison:  PET CT 05/18/2012 and earlier studies  Technique and findings: The procedure, risks (including but not limited to bleeding, infection, organ damage, pneumothorax, chest tube), benefits, and alternatives were explained to the patient. Questions regarding the procedure were encouraged and answered. The patient understands and consents to the procedure.The patient placed prone and select axial scans through the upper thorax obtained.  The lesion was localized and an appropriate skin entry site identified. Operator donned sterile gloves and mask.   Site was marked, prepped with Betadine, draped in usual sterile fashion, infiltrated locally with 1% lidocaine.  Intravenous Fentanyl and Versed were administered as conscious sedation during continuous cardiorespiratory monitoring by the radiology RN, with a total moderate sedation time of six minutes.  Under CT fluoroscopic guidance, a 17 gauge trocar needle was advanced to the margin of the lesion.  Once needle tip position was confirmed, coaxial 18-gauge core biopsy samples were obtained, submitted in formalin to  surgical pathology.  The guide needle was removed.  Postprocedure scans show no pneumothorax or hemorrhage. The patient tolerated the procedure well.  IMPRESSION: Technically successful CT-guided right upper lobe lung lesion core biopsy.   Original Report Authenticated By: D. Andria Rhein,  MD    US Thoracentesis Asp Pleural Space W/img Guide  05/25/2012  *RADIOLOGY REPORT*  Clinical Data:  Right-sided pleural effusion, right lung mass. Request for thoracentesis  ULTRASOUND GUIDED right THORACENTESIS  Comparison:  CT right lung biopsy earlier today  An ultrasound guided thoracentesis was thoroughly discussed with the patient and questions answered.  The benefits, risks, alternatives and complications were also discussed.  The patient understands and wishes to proceed with the procedure.  Written consent was obtained.  Ultrasound was performed to localize and mark an adequate pocket of fluid in the right chest.  The area was then prepped and draped in the normal sterile fashion.  1% Lidocaine was used for local anesthesia.  Under ultrasound guidance a 19 gauge Yueh catheter was introduced.  Thoracentesis was performed.  The catheter was removed and a dressing applied.  Complications:  None  Findings: A total of approximately 350 ml of blood tinged fluid was removed. A fluid sample was sent for laboratory analysis.  IMPRESSION: Successful ultrasound guided right thoracentesis yielding 350 ml of pleural fluid.  Read by Brayton El PA-C   Original Report Authenticated By: D. Andria Rhein, MD     ASSESSMENT: This is a very pleasant 79 years old white female recently diagnosed with questionable metastatic non-small cell lung cancer, adenocarcinoma presented with right upper lobe lung mass as well as mediastinal lymphadenopathy and left lower lobe lesion suspicious for metastasis versus second primary.  PLAN: I have a lengthy discussion with the patient and her daughter today about her disease stage, prognosis and treatment options. I recommended for the patient to proceed with a short course of palliative radiotherapy to the right upper lobe lung mass under the care of Dr. Mitzi Hansen. He would see the patient later today.  Also discussed with the patient her treatment options including palliative care  and hospice referral versus systemic chemotherapy. The patient is interested in treatment and I may consider her for treatment with systemic chemotherapy in the form of carboplatin for AUC of 5 and Alimta 500 mg/M2 every 3 weeks. I discussed with the patient and the adverse effect of the chemotherapy including but not limited to alopecia, myelosuppression, nausea and vomiting, peripheral neuropathy, liver or renal dysfunction.  I would consider starting the systemic chemotherapy after completion  of the short course of palliative radiotherapy. In the meantime I will send her tissue blocks to be tested for EGFR mutation as well as ALK gene translocation. If the patient has a positive mutation she would be treated with oral target lesion instead of chemotherapy. I would see her back for follow up visit in 3 weeks for reevaluation and more detailed discussion of her treatment options based on the molecular studies. The patient agreed to the current plan. I gave the patient and her daughter the time to ask questions and I answered them completely to their satisfaction. The patient voiced understanding of her current disease stage, prognosis and treatment options. She was advised to call me immediately if she has any concerning symptoms in the interval.  All questions were answered. The patient knows to call the clinic with any problems, questions or concerns. We can certainly see the patient much sooner if necessary.  Thank you so much for allowing me to participate in the care of Sylvia Link. I will continue to follow up the patient with you and assist in her care.  I spent 40 minutes counseling the patient face to face. The total time spent in the appointment was 60 minutes.  Lacy Sofia K. 06/01/2012, 11:44 PM

## 2012-06-01 NOTE — Progress Notes (Signed)
[facility] Clinical Social Work  Clinical Social Work met with patient, patient's daughter, and Systems developer at [employer] today.  MD reviewed patient's diagnosis and treatment plan.  Patient agreeable to plan and is eager to begin radiation treatment.  Sylvia [name] states she has a large support system including her children and neighbors/friends.  She is very active, states she currently works at [employer] and exercises at [employer].  She has indicated now psychosocial needs at this time.  CSW briefly reviewed CSW role and support programs at [facility].  Sylvia [name], MSW, LCSW Clinical Social Worker [facility] [phone]

## 2012-06-01 NOTE — Progress Notes (Signed)
[address]            [place] [address]          [phone]       Roanne Haye [facility] Medical Record #[id number] Date of Birth: May 12, 1926  Lupita Raider, MD Lupita Raider, MD  Chief Complaint:   PostOp Follow Up Visit   History of Present Illness:     Patient referred to thoracic surgery after CT scan revealed a large right upper lobe lung mass. The patient had noticed worsening headache weakness shortness of breath cough and painful joints for 4-6 months. Her sed level was noted to be greater than 140. Last week a chest x-ray was performed it showed a large mass and the patient was referred for CT scan of the chest.  She has been a smoker in the past but has not smoked for the past 3-4 years. The needle biopsy of the right lung mass confirms adenocarcinoma.           History  Smoking status  . Former Smoker -- 0.25 packs/day for 60 years  Smokeless tobacco  . Not on file       No Known Allergies  Current Outpatient Prescriptions  Medication Sig Dispense Refill  . Ascorbic Acid (VITAMIN C PO) Take 1 tablet by mouth daily.      Marland Kitchen BENICAR HCT 40-12.5 MG per tablet Take 1 tablet by mouth daily.       . felodipine (PLENDIL) 10 MG 24 hr tablet Take 10 mg by mouth daily.       . fish oil-omega-3 fatty acids 1000 MG capsule Take 1 g by mouth daily.      . IRON PO Take 1 tablet by mouth daily.      . Multiple Vitamin (MULTIVITAMIN WITH MINERALS) TABS Take 1 tablet by mouth daily.      Marland Kitchen VITAMIN D, CHOLECALCIFEROL, PO Take 1 tablet by mouth daily.       . Vitamin Mixture (VITAMIN E COMPLETE) CAPS Take 1 capsule by mouth daily.        No current facility-administered medications for this visit.       Physical Exam: There were no vitals taken for this visit.  General appearance: alert and cooperative Neurologic: intact Heart: regular rate and rhythm, S1, S2 normal, no murmur, click, rub or gallop Lungs: clear to  auscultation bilaterally Abdomen: soft, non-tender; bowel sounds normal; no masses,  no organomegaly Extremities: extremities normal, atraumatic, no cyanosis or edema and Homans sign is negative, no sign of DVT   Diagnostic Studies & Laboratory data:         Recent Radiology Findings: No results found.    Recent Labs: Lab Results  Component Value Date   WBC 6.1 05/25/2012   HGB 7.9* 05/25/2012   HCT 23.2* 05/25/2012   PLT 453* 05/25/2012   GLUCOSE 108* 06/07/2011   ALT 12 06/07/2011   AST 21 06/07/2011   NA 134* 06/07/2011   K 4.5 06/07/2011   CL 96 06/07/2011   CREATININE 0.98 06/07/2011   BUN 32* 06/07/2011   CO2 27 06/07/2011   INR 1.07 05/25/2012      Assessment / Plan:     I discussed the diagnosis of adenocarcinoma with the patient, and she is being seen today by medical oncology and radiation oncology to consider treatment options For what appears to be  a t2b,n3,m1a Stage iv        Carissa Musick B 06/01/2012 5:43 PM

## 2012-06-02 NOTE — Patient Instructions (Signed)
You had recent diagnosis of suspicious metastatic non-small cell lung cancer, adenocarcinoma. We discussed treatment options including systemic chemotherapy. He would benefit from a short course of palliative radiotherapy to the right upper lobe lung mass. I will send you a tissue to be tested for EGFR mutation as well as ALK gene translocation. Followup visit in 3 weeks.

## 2012-06-05 ENCOUNTER — Telehealth: Payer: Self-pay | Admitting: *Deleted

## 2012-06-05 NOTE — Telephone Encounter (Signed)
Called pt to follow up with appt.  Left vm message

## 2012-06-06 DIAGNOSIS — C3410 Malignant neoplasm of upper lobe, unspecified bronchus or lung: Secondary | ICD-10-CM | POA: Insufficient documentation

## 2012-06-06 NOTE — Progress Notes (Signed)
Radiation Oncology         [phone]) [phone] ________________________________  Name: Sylvia [name] MRN: [id number]  Date: 06/01/2012  DOB: 1927/01/11  NW:GNFA,OZHYQMVHQ, MD  Delight Ovens, MD   Velora Heckler. Mohamed M.D.  REFERRING PHYSICIAN: Delight Ovens, MD   DIAGNOSIS: The encounter diagnosis was Malignant neoplasm of upper lobe, bronchus or lung, right.   HISTORY OF PRESENT ILLNESS::Sylvia [name] is a 79 y.o. female who is seen for an initial consultation visit. The patient noted some increasing fatigue and weakness as well as a cough earlier this year in March. A chest x-ray demonstrated a potential large mass and she proceeded to undergo a CT scan of the chest for further evaluation. A large irregular tumor was present within the right upper lobe consistent with a primary lung carcinoma. The tumor extends to the right hilum and encases the vessels. There is also a probable metastatic lymph node in the anterior/superior mediastinum as well as a probable second primary tumor versus metastasis within the left lower lobe. A right-sided pleural effusion was present. A thoracentesis was performed but no malignant cells were seen on cytology. This is felt to remain suspicious however for a malignant pleural effusion.  The patient has undergone further workup including a PET scan on 05/18/2012. A 5 cm hypermetabolic mass was seen within the posterior upper lobe on the right consistent with a primary bronchogenic carcinoma. A small to moderate right pleural effusion was seen. A 1 cm hypervascular right upper visceral cervical lymph node was seen at level VI, suspicious for metastatic disease. A 2 cm groundglass nodule in the left lower lobe showed minimal hypermetabolic Jevity, suspicious for possible synchronous low-grade adenocarcinoma. An MRI scan of the brain also was performed and this did not reveal any evidence of intracranial metastatic disease.  A CT-guided biopsy was performed on  05/25/2012. This has returned positive for an adenocarcinoma consistent with a lung primary.  The patient's case was discussed in multidisciplinary conference today and she is seen in multidisciplinary thoracic clinic this afternoon. Overall, the patient states that she is feeling fairly well. She does have some fatigue as well as some occasional headaches.  PREVIOUS RADIATION THERAPY: No   PAST MEDICAL HISTORY:  has a past medical history of HTN (hypertension); Renal insufficiency; IBS (irritable bowel syndrome); Anemia (06/2010); Raynaud's syndrome; PVD (peripheral vascular disease); Carotid stenosis; Raynaud phenomenon (since 1990's); Hoarseness of voice; and PVD (peripheral vascular disease).     PAST SURGICAL HISTORY: Past Surgical History  Procedure Laterality Date  . Colonoscopy  02/2004 and 05/2011    last colonoscopy in April 2013 was negative by Dr. Laural Benes  . Hemorrhoid surgery    . Breast surgery      lumpectomy- right  . Hemorrhoid surgery    . Artery biopsy  12/30/2011    Procedure: MINOR BIOPSY TEMPORAL ARTERY;  Surgeon: Currie Paris, MD;  Location: [facility];  Service: General;  Laterality: Right;  temoral artery biopsy -right side     FAMILY HISTORY: family history includes Pneumonia in her mother and Stroke in her father.   SOCIAL HISTORY:  reports that she has quit smoking. She does not have any smokeless tobacco history on file. She reports that she does not drink alcohol or use illicit drugs.   ALLERGIES: Review of patient's allergies indicates no known allergies.   MEDICATIONS:  Current Outpatient Prescriptions  Medication Sig Dispense Refill  . Ascorbic Acid (VITAMIN C PO) Take 1 tablet by mouth daily.      Marland Kitchen  BENICAR HCT 40-12.5 MG per tablet Take 1 tablet by mouth daily.       . felodipine (PLENDIL) 10 MG 24 hr tablet Take 10 mg by mouth daily.       . fish oil-omega-3 fatty acids 1000 MG capsule Take 1 g by mouth daily.      . IRON  PO Take 1 tablet by mouth daily.      . Multiple Vitamin (MULTIVITAMIN WITH MINERALS) TABS Take 1 tablet by mouth daily.      Marland Kitchen VITAMIN D, CHOLECALCIFEROL, PO Take 1 tablet by mouth daily.       . Vitamin Mixture (VITAMIN E COMPLETE) CAPS Take 1 capsule by mouth daily.        No current facility-administered medications for this encounter.     REVIEW OF SYSTEMS:  A 15 point review of systems is documented in the electronic medical record. This was obtained by the nursing staff. However, I reviewed this with the patient to discuss relevant findings and make appropriate changes.  Pertinent items are noted in HPI.    PHYSICAL EXAM:  height is 5\' 3"  (1.6 m) and weight is 103 lb (46.72 kg). Her oral temperature is 97.2 F (36.2 C). Her blood pressure is 96/38 and her pulse is 53. Her respiration is 16 and oxygen saturation is 96%.   General: Well-developed, in no acute distress HEENT: Normocephalic, atraumatic; oral cavity clear Neck: Supple without any lymphadenopathy Cardiovascular: Regular rate and rhythm Respiratory: Clear to auscultation bilaterally, with decreased breath sounds in the right upper lung GI: Soft, nontender, normal bowel sounds Extremities: No edema present Neuro: No focal deficits    LABORATORY DATA:  Lab Results  Component Value Date   WBC 6.1 05/25/2012   HGB 7.9* 05/25/2012   HCT 23.2* 05/25/2012   MCV 79.2 05/25/2012   PLT 453* 05/25/2012   Lab Results  Component Value Date   NA 134* 06/07/2011   K 4.5 06/07/2011   CL 96 06/07/2011   CO2 27 06/07/2011   Lab Results  Component Value Date   ALT 12 06/07/2011   AST 21 06/07/2011   ALKPHOS 70 06/07/2011   BILITOT 0.4 06/07/2011      RADIOGRAPHY: Dg Chest 1 View  05/25/2012  *RADIOLOGY REPORT*  Clinical Data: Post thoracentesis.  CHEST - 1 VIEW  Comparison: 05/04/2012  Findings: There is no evidence for a pneumothorax.  Again noted is a mass in the right upper chest.  Stable appearance of the heart and  mediastinum.  Trachea is midline.  IMPRESSION: No evidence for a pneumothorax following thoracentesis.  Minimal fluid or blunting at the right lung base.  Right lung mass.   Original Report Authenticated By: Richarda Overlie, M.D.    Mr Laqueta Jean WU Contrast  05/20/2012  *RADIOLOGY REPORT*  Clinical Data: Headaches.  New diagnosis of lung cancer.  MRI HEAD WITHOUT AND WITH CONTRAST  Technique:  Multiplanar, multiecho pulse sequences of the brain and surrounding structures were obtained according to standard protocol without and with intravenous contrast  Contrast:  9 ml MultiHance.  Comparison: 12/09/2011 MR.  01/26/2011 CT.  Findings: No evidence of intracranial enhancing lesion or bony destructive lesion to suggest the presence of intracranial metastatic disease.  No acute infarct.  No intracranial hemorrhage.  Prominent pineal calcification unchanged from remote CT.  Global atrophy.  Ventricular prominence may be related atrophy although difficult to exclude mild component of hydrocephalus. Appearance unchanged.  Mild to moderate small vessel disease type changes.  Opacification mastoid air cells greater on the right.  Small vertebral arteries with possible narrowing of the left vertebral artery.  Remainder of major intracranial vascular structures are patent.  Cervical spondylotic changes with facet joint degenerative changes greater on the left and anterior slip of C3 upon C4 with spinal stenosis and slight cord flattening.  IMPRESSION: No evidence of intracranial enhancing lesion or bony destructive lesion to suggest the presence of intracranial metastatic disease.  Global atrophy.  Ventricular prominence may be related atrophy although difficult to exclude mild component of hydrocephalus. Appearance unchanged.  Mild to moderate small vessel disease type changes.  Opacification mastoid air cells greater on the right.  Small vertebral arteries with possible narrowing of the left vertebral artery.  Remainder of major  intracranial vascular structures are patent.  Cervical spondylotic changes with facet joint degenerative changes greater on the left and anterior slip of C3 upon C4 with spinal stenosis and slight cord flattening.   Original Report Authenticated By: Lacy Duverney, M.D.    Nm Pet Image Restag (ps) Skull Base To Thigh  05/18/2012  *RADIOLOGY REPORT*  Clinical Data: Initial treatment strategy for right lung mass.  NUCLEAR MEDICINE PET SKULL BASE TO THIGH  Fasting Blood Glucose:  111  Technique:  14.1 mCi F-18 FDG was injected intravenously. CT data was obtained and used for attenuation correction and anatomic localization only.  (This was not acquired as a diagnostic CT examination.) Additional exam technical data entered on technologist worksheet.  Comparison:  Chest CT on 05/05/2012  Findings:  Neck: A 1.1 cm hypermetabolic upper visceral lymph node is seen on the right at level VI.  This has a maximum SUV of 8.7.  No other hypermetabolic lymph nodes seen within the neck.  Chest:  Large approximately 5 cm mass in the posterior right upper lobe has a maximum SUV of 15.5.  A small to moderate right pleural effusion is seen, although no hypermetabolic activity is seen.  2 cm ground-glass nodule in the superior segment of the left lower lobe shows minimal hypermetabolic activity with maximum SUV of 2.2. This is suspicious for low grade adenocarcinoma.  No hypermetabolic lymphadenopathy identified within the thorax.  Abdomen/Pelvis:  No abnormal hypermetabolic activity within the liver, pancreas, adrenal glands, or spleen.  No hypermetabolic lymph nodes in the abdomen or pelvis.  Skeleton:  No focal hypermetabolic activity to suggest skeletal metastasis.  IMPRESSION:  1.  5 cm hypermetabolic mass in the posterior upper lobe, consistent with primary bronchogenic carcinoma.  Small to moderate right pleural effusion shows no hypermetabolic activity, however a malignant pleural effusion cannot be excluded. Diagnostic  thoracentesis is recommended. 2.  1 cm hypervascular right upper visceral cervical lymph node at level VI, suspicious for metastatic disease. 3.  2 cm ground-glass nodule in the superior left lower lobe with minimal hypermetabolic activity, suspicious for synchronous low grade bronchogenic adenocarcinoma.   Original Report Authenticated By: Myles Rosenthal, M.D.    Ct Biopsy  05/25/2012  *RADIOLOGY REPORT*  Clinical data:  Right upper lobe mass  CT-GUIDED LUNG CORE BIOPSY  Comparison:  PET CT 05/18/2012 and earlier studies  Technique and findings: The procedure, risks (including but not limited to bleeding, infection, organ damage, pneumothorax, chest tube), benefits, and alternatives were explained to the patient. Questions regarding the procedure were encouraged and answered. The patient understands and consents to the procedure.The patient placed prone and select axial scans through the upper thorax obtained.  The lesion was localized and an appropriate skin entry site  identified. Operator donned sterile gloves and mask.   Site was marked, prepped with Betadine, draped in usual sterile fashion, infiltrated locally with 1% lidocaine.  Intravenous Fentanyl and Versed were administered as conscious sedation during continuous cardiorespiratory monitoring by the radiology RN, with a total moderate sedation time of six minutes.  Under CT fluoroscopic guidance, a 17 gauge trocar needle was advanced to the margin of the lesion.  Once needle tip position was confirmed, coaxial 18-gauge core biopsy samples were obtained, submitted in formalin to  surgical pathology.  The guide needle was removed.  Postprocedure scans show no pneumothorax or hemorrhage. The patient tolerated the procedure well.  IMPRESSION: Technically successful CT-guided right upper lobe lung lesion core biopsy.   Original Report Authenticated By: D. Andria Rhein, MD    US Thoracentesis Asp Pleural Space W/img Guide  05/25/2012  *RADIOLOGY REPORT*   Clinical Data:  Right-sided pleural effusion, right lung mass. Request for thoracentesis  ULTRASOUND GUIDED right THORACENTESIS  Comparison:  CT right lung biopsy earlier today  An ultrasound guided thoracentesis was thoroughly discussed with the patient and questions answered.  The benefits, risks, alternatives and complications were also discussed.  The patient understands and wishes to proceed with the procedure.  Written consent was obtained.  Ultrasound was performed to localize and mark an adequate pocket of fluid in the right chest.  The area was then prepped and draped in the normal sterile fashion.  1% Lidocaine was used for local anesthesia.  Under ultrasound guidance a 19 gauge Yueh catheter was introduced.  Thoracentesis was performed.  The catheter was removed and a dressing applied.  Complications:  None  Findings: A total of approximately 350 ml of blood tinged fluid was removed. A fluid sample was sent for laboratory analysis.  IMPRESSION: Successful ultrasound guided right thoracentesis yielding 350 ml of pleural fluid.  Read by Brayton El PA-C   Original Report Authenticated By: D. Andria Rhein, MD        IMPRESSION:  The patient is a pleasant 79 year old female who is presenting with advanced non-small cell lung cancer. Her case was discussed in conference and the consensus was to proceed with a short course of palliative radiotherapy followed by systemic treatment through Dr. Arbutus Ped. This treatment could begin concurrently during radiotherapy as well.  I discussed with the patient the details of her case and we also discussed the rationale of radiotherapy in this setting. We discussed the expected benefits therefore of radiotherapy in terms of improvement in symptoms and helping with local control. We also discussed the potential side effects and risks of treatment. All of her questions were answered and she was also accompanied by her daughter today.     PLAN:  We discussed a 3  week course of palliative radiotherapy. The patient does wish to proceed with this treatment. Therefore she will be scheduled for a simulation as soon as this can be arranged.        ________________________________   Radene Gunning, MD, PhD

## 2012-06-07 ENCOUNTER — Encounter: Payer: Self-pay | Admitting: Radiation Oncology

## 2012-06-07 ENCOUNTER — Ambulatory Visit
Admission: RE | Admit: 2012-06-07 | Discharge: 2012-06-07 | Disposition: A | Discharge status: Home / Self Care | Payer: Medicare Other | Source: Ambulatory Visit | Attending: Radiation Oncology | Admitting: Radiation Oncology

## 2012-06-07 ENCOUNTER — Telehealth: Payer: Self-pay | Admitting: *Deleted

## 2012-06-07 DIAGNOSIS — Z79899 Other long term (current) drug therapy: Secondary | ICD-10-CM | POA: Insufficient documentation

## 2012-06-07 DIAGNOSIS — C3490 Malignant neoplasm of unspecified part of unspecified bronchus or lung: Secondary | ICD-10-CM | POA: Insufficient documentation

## 2012-06-07 DIAGNOSIS — C3410 Malignant neoplasm of upper lobe, unspecified bronchus or lung: Secondary | ICD-10-CM | POA: Insufficient documentation

## 2012-06-07 DIAGNOSIS — Z510 Encounter for antineoplastic radiation therapy: Secondary | ICD-10-CM | POA: Insufficient documentation

## 2012-06-07 DIAGNOSIS — R110 Nausea: Secondary | ICD-10-CM | POA: Insufficient documentation

## 2012-06-07 DIAGNOSIS — M79609 Pain in unspecified limb: Secondary | ICD-10-CM | POA: Insufficient documentation

## 2012-06-07 NOTE — Telephone Encounter (Signed)
FISH Study--ALK not detected.

## 2012-06-07 NOTE — Progress Notes (Signed)
Opened in error

## 2012-06-07 NOTE — Progress Notes (Signed)
Patient presented to the clinic today following simulation for nurse evaluation. This is an MTOC patient. Patient alert and oriented to person, place, and time. No distress noted. Steady gait noted. Pleasant affect noted. Patient reports chronic bilateral leg pain for which she takes Tylenol. Patient reports fatigue. Patient reports difficulty sleeping within the last week but, is unsure what it is related to. Patient denies history of radiation therapy. Also, patient denies having a pacemaker. Patient reports congestion in chest. Patient reports an occasional cough with white sputum. Patient reports that her voice is often hoarse. Patient denies shortness of breath. Patient reports that when she behind down to attempt to do yard work her head hurts and she often has to stop because of an intense headache. Reported all findings to Dr. Mitzi Hansen.

## 2012-06-08 ENCOUNTER — Encounter: Payer: Self-pay | Admitting: *Deleted

## 2012-06-08 NOTE — Progress Notes (Signed)
EGFR Alteration Not Detected ([facility] results 06/08/12)   ALK Rearrangement Not Detected ([facility] results 06/08/12)  Dr. Arbutus Ped aware

## 2012-06-09 ENCOUNTER — Telehealth: Payer: Self-pay | Admitting: Medical Oncology

## 2012-06-09 NOTE — Telephone Encounter (Signed)
Dr Alver Fisher nurse, [facility] asked if we could do CBC and transfuse pt if needed. I told Amy to have Dr Clelia Croft do orders for short stay since pt not undergoing chemothearpy.

## 2012-06-12 ENCOUNTER — Other Ambulatory Visit (HOSPITAL_COMMUNITY): Payer: Self-pay | Admitting: *Deleted

## 2012-06-13 ENCOUNTER — Encounter (HOSPITAL_COMMUNITY)
Admission: RE | Admit: 2012-06-13 | Discharge: 2012-06-13 | Disposition: A | Discharge status: Home / Self Care | Payer: Medicare Other | Source: Ambulatory Visit | Attending: Family Medicine | Admitting: Family Medicine

## 2012-06-13 DIAGNOSIS — D649 Anemia, unspecified: Secondary | ICD-10-CM | POA: Insufficient documentation

## 2012-06-13 LAB — PREPARE RBC (CROSSMATCH)

## 2012-06-13 MED ORDER — FUROSEMIDE 10 MG/ML IJ SOLN
20.0000 mg | Freq: Once | INTRAMUSCULAR | Status: DC
  Filled 2012-06-13: qty 2

## 2012-06-13 MED ORDER — FELODIPINE ER 10 MG PO TB24
10.0000 mg | ORAL_TABLET | Freq: Once | ORAL | Status: AC
  Administered 2012-06-13 (×2): 10 mg via ORAL
  Filled 2012-06-13 (×2): qty 1

## 2012-06-13 MED ORDER — IRBESARTAN 300 MG PO TABS
300.0000 mg | ORAL_TABLET | Freq: Once | ORAL | Status: AC
  Administered 2012-06-13: 300 mg via ORAL
  Filled 2012-06-13: qty 1

## 2012-06-13 MED ORDER — HYDROCHLOROTHIAZIDE 12.5 MG PO CAPS
12.5000 mg | ORAL_CAPSULE | Freq: Once | ORAL | Status: AC
  Administered 2012-06-13: 12.5 mg via ORAL
  Filled 2012-06-13: qty 1

## 2012-06-13 MED ORDER — HYDROCHLOROTHIAZIDE 25 MG PO TABS
12.5000 mg | ORAL_TABLET | Freq: Once | ORAL | Status: DC
  Filled 2012-06-13: qty 0.5

## 2012-06-14 ENCOUNTER — Encounter (HOSPITAL_COMMUNITY)
Admission: RE | Admit: 2012-06-14 | Discharge: 2012-06-14 | Disposition: A | Discharge status: Home / Self Care | Payer: Medicare Other | Source: Ambulatory Visit | Attending: Family Medicine | Admitting: Family Medicine

## 2012-06-15 ENCOUNTER — Telehealth: Payer: Self-pay | Admitting: *Deleted

## 2012-06-15 ENCOUNTER — Ambulatory Visit
Admission: RE | Admit: 2012-06-15 | Discharge: 2012-06-15 | Disposition: A | Discharge status: Home / Self Care | Payer: Medicare Other | Source: Ambulatory Visit | Attending: Radiation Oncology | Admitting: Radiation Oncology

## 2012-06-15 LAB — TYPE AND SCREEN
DAT, IgG: NEGATIVE
PT AG Type: NEGATIVE
Unit division: 0

## 2012-06-15 NOTE — Telephone Encounter (Signed)
Pt called with questions regarding appt.  Clarified all appt scheduled

## 2012-06-16 ENCOUNTER — Ambulatory Visit
Admission: RE | Admit: 2012-06-16 | Discharge: 2012-06-16 | Disposition: A | Discharge status: Home / Self Care | Payer: Medicare Other | Source: Ambulatory Visit | Attending: Radiation Oncology | Admitting: Radiation Oncology

## 2012-06-16 MED ORDER — RADIAPLEXRX EX GEL
Freq: Once | CUTANEOUS | Status: AC
  Administered 2012-06-16: 1 via TOPICAL

## 2012-06-16 NOTE — Progress Notes (Addendum)
Sylvia [name] here for weekly under treat visit.  She has had 2/15 fractions to her right lung.  She denies pain and shortness of breath.  She does have fatigue.  She was given the Radiation Therapy and You booklet and educated on the potential side effects of radiation therapy including skin changes, throat changes and fatigue.  She was given radiaplex gel and instructed to use it twice a day, with the first dose at least 4 hours prior to her treatment.  She was instructed to contact nursing with any questions or concerns.

## 2012-06-16 NOTE — Progress Notes (Signed)
   Department of Radiation Oncology  Phone:  [phone] Fax:        [phone]  Weekly Treatment Note    Name: Sylvia [name] Date: 06/16/2012 MRN: [id number] DOB: 18-May-1926   Current dose: 5 Gy  Current fraction: 2   MEDICATIONS: Current Outpatient Prescriptions  Medication Sig Dispense Refill  . Ascorbic Acid (VITAMIN C PO) Take 1 tablet by mouth daily.      Marland Kitchen BENICAR HCT 40-12.5 MG per tablet Take 1 tablet by mouth daily.       . felodipine (PLENDIL) 10 MG 24 hr tablet Take 10 mg by mouth daily.       . fish oil-omega-3 fatty acids 1000 MG capsule Take 1 g by mouth daily.      . IRON PO Take 1 tablet by mouth daily.      . Multiple Vitamin (MULTIVITAMIN WITH MINERALS) TABS Take 1 tablet by mouth daily.      Marland Kitchen VITAMIN D, CHOLECALCIFEROL, PO Take 1 tablet by mouth daily.       . Vitamin Mixture (VITAMIN E COMPLETE) CAPS Take 1 capsule by mouth daily.        No current facility-administered medications for this encounter.     ALLERGIES: Review of patient's allergies indicates no known allergies.   LABORATORY DATA:  Lab Results  Component Value Date   WBC 6.1 05/25/2012   HGB 7.9* 05/25/2012   HCT 23.2* 05/25/2012   MCV 79.2 05/25/2012   PLT 453* 05/25/2012   Lab Results  Component Value Date   NA 134* 06/07/2011   K 4.5 06/07/2011   CL 96 06/07/2011   CO2 27 06/07/2011   Lab Results  Component Value Date   ALT 12 06/07/2011   AST 21 06/07/2011   ALKPHOS 70 06/07/2011   BILITOT 0.4 06/07/2011     NARRATIVE: Sylvia [name] was seen today for weekly treatment management. The chart was checked and the patient's films were reviewed. The patient states that she is done through well with the beginning of her treatment. No complaints.  PHYSICAL EXAMINATION: height is 5\' 3"  (1.6 m) and weight is 105 lb 4.8 oz (47.764 kg). Her temperature is 97.8 F (36.6 C). Her blood pressure is 146/43 and her pulse is 63. Her oxygen saturation is 92%.        ASSESSMENT: The patient is  doing satisfactorily with treatment.  PLAN: We will continue with the patient's radiation treatment as planned.

## 2012-06-19 ENCOUNTER — Ambulatory Visit
Admission: RE | Admit: 2012-06-19 | Discharge: 2012-06-19 | Disposition: A | Discharge status: Home / Self Care | Payer: Medicare Other | Source: Ambulatory Visit | Attending: Radiation Oncology | Admitting: Radiation Oncology

## 2012-06-20 ENCOUNTER — Telehealth: Payer: Self-pay | Admitting: *Deleted

## 2012-06-20 ENCOUNTER — Ambulatory Visit
Admission: RE | Admit: 2012-06-20 | Discharge: 2012-06-20 | Disposition: A | Discharge status: Home / Self Care | Payer: Medicare Other | Source: Ambulatory Visit | Attending: Radiation Oncology | Admitting: Radiation Oncology

## 2012-06-20 NOTE — Telephone Encounter (Signed)
Spoke with pt today.  She had questions regarding my chart.  I stated I will call IT and follow up

## 2012-06-21 ENCOUNTER — Ambulatory Visit
Admission: RE | Admit: 2012-06-21 | Discharge: 2012-06-21 | Disposition: A | Discharge status: Home / Self Care | Payer: Medicare Other | Source: Ambulatory Visit | Attending: Radiation Oncology | Admitting: Radiation Oncology

## 2012-06-21 ENCOUNTER — Other Ambulatory Visit: Payer: Self-pay | Admitting: *Deleted

## 2012-06-22 ENCOUNTER — Other Ambulatory Visit (HOSPITAL_BASED_OUTPATIENT_CLINIC_OR_DEPARTMENT_OTHER): Payer: Medicare Other | Admitting: Lab

## 2012-06-22 ENCOUNTER — Ambulatory Visit (HOSPITAL_BASED_OUTPATIENT_CLINIC_OR_DEPARTMENT_OTHER): Payer: Medicare Other | Admitting: Internal Medicine

## 2012-06-22 ENCOUNTER — Encounter: Payer: Self-pay | Admitting: Internal Medicine

## 2012-06-22 ENCOUNTER — Ambulatory Visit
Admission: RE | Admit: 2012-06-22 | Discharge: 2012-06-22 | Disposition: A | Discharge status: Home / Self Care | Payer: Medicare Other | Source: Ambulatory Visit | Attending: Radiation Oncology | Admitting: Radiation Oncology

## 2012-06-22 DIAGNOSIS — C3410 Malignant neoplasm of upper lobe, unspecified bronchus or lung: Secondary | ICD-10-CM

## 2012-06-22 LAB — CBC WITH DIFFERENTIAL/PLATELET
BASO%: 0.3 % (ref 0.0–2.0)
EOS%: 2.5 % (ref 0.0–7.0)
LYMPH%: 8.5 % — ABNORMAL LOW (ref 14.0–49.7)
MCH: 26.9 pg (ref 25.1–34.0)
MCHC: 31.8 g/dL (ref 31.5–36.0)
MONO#: 0.8 10*3/uL (ref 0.1–0.9)
Platelets: 389 10*3/uL (ref 145–400)
RBC: 3.86 10*6/uL (ref 3.70–5.45)
WBC: 6.1 10*3/uL (ref 3.9–10.3)

## 2012-06-22 LAB — COMPREHENSIVE METABOLIC PANEL (CC13)
Albumin: 3 g/dL — ABNORMAL LOW (ref 3.5–5.0)
BUN: 23.9 mg/dL (ref 7.0–26.0)
Calcium: 9 mg/dL (ref 8.4–10.4)
Chloride: 95 mEq/L — ABNORMAL LOW (ref 98–107)
Creatinine: 0.9 mg/dL (ref 0.6–1.1)
Glucose: 101 mg/dl — ABNORMAL HIGH (ref 70–99)
Sodium: 130 mEq/L — ABNORMAL LOW (ref 136–145)

## 2012-06-22 MED ORDER — DEXAMETHASONE 4 MG PO TABS: ORAL_TABLET | ORAL | Status: DC

## 2012-06-22 MED ORDER — INTEGRA PLUS PO CAPS: 1.0000 | ORAL_CAPSULE | Freq: Every morning | ORAL | Status: DC

## 2012-06-22 MED ORDER — CYANOCOBALAMIN 1000 MCG/ML IJ SOLN
1000.0000 ug | Freq: Once | INTRAMUSCULAR | Status: AC
  Administered 2012-06-22: 1000 ug via INTRAMUSCULAR

## 2012-06-22 MED ORDER — FOLIC ACID 1 MG PO TABS: 1.0000 mg | ORAL_TABLET | Freq: Every day | ORAL | Status: DC

## 2012-06-22 MED ORDER — PROCHLORPERAZINE MALEATE 10 MG PO TABS: 10.0000 mg | ORAL_TABLET | Freq: Four times a day (QID) | ORAL | Status: DC | PRN

## 2012-06-22 NOTE — Progress Notes (Signed)
[facility] [phone]   [phone]  OFFICE PROGRESS NOTE  SHAW,KIMBERLEE, MD [address] [place] [address]  DIAGNOSIS: Metastatic non-small cell lung cancer, adenocarcinoma, negative EGFR mutation, negative ALK gene translocation, presented with right upper lobe lung mass as well as mediastinal lymphadenopathy and left lower lobe lesion diagnosed in April of 2014.  PRIOR THERAPY: Palliative radiotherapy under the care of Dr. Mitzi Hansen to the right upper lobe lung mass and mediastinum expected to be completed on 07/05/2012  CURRENT THERAPY: The patient will start systemic chemotherapy with carboplatin for AUC of 5 and Alimta 500 mg/M2 on 07/10/2012  INTERVAL HISTORY: Sylvia [name] 79 y.o. female returns to the clinic today for followup visit accompanied by her daughter. The patient is feeling fine today with no specific complaints. She denied having any significant chest pain, shortness breath, cough or hemoptysis. She denied having any significant weight loss or night sweats. The patient is currently undergoing palliative radiotherapy to the right upper lobe lung mass under the care of Dr. Mitzi Hansen and she is tolerating it fairly well. The tissue blocks were sent for EGFR mutation as well as ALK gene translocation but unfortunately both tests were negative. She is here for evaluation and discussion of her systemic treatment options.  MEDICAL HISTORY: Past Medical History  Diagnosis Date  . HTN (hypertension)   . Renal insufficiency   . IBS (irritable bowel syndrome)   . Anemia 06/2010  . Raynaud's syndrome   . PVD (peripheral vascular disease)   . Carotid stenosis   . Raynaud phenomenon since 1990's  . Hoarseness of voice   . PVD (peripheral vascular disease)     ALLERGIES:  has No Known Allergies.  MEDICATIONS:  Current Outpatient Prescriptions  Medication Sig Dispense Refill  . Ascorbic Acid (VITAMIN C PO) Take 1 tablet  by mouth daily.      Marland Kitchen BENICAR HCT 40-12.5 MG per tablet Take 1 tablet by mouth daily.       . felodipine (PLENDIL) 10 MG 24 hr tablet Take 10 mg by mouth daily.       . fish oil-omega-3 fatty acids 1000 MG capsule Take 1 g by mouth daily.      . hyaluronate sodium (RADIAPLEXRX) GEL Apply topically 2 (two) times daily.      . IRON PO Take 1 tablet by mouth daily.      . Multiple Vitamin (MULTIVITAMIN WITH MINERALS) TABS Take 1 tablet by mouth daily.      Marland Kitchen VITAMIN D, CHOLECALCIFEROL, PO Take 1 tablet by mouth daily.       . Vitamin Mixture (VITAMIN E COMPLETE) CAPS Take 1 capsule by mouth daily.        No current facility-administered medications for this visit.    SURGICAL HISTORY:  Past Surgical History  Procedure Laterality Date  . Colonoscopy  02/2004 and 05/2011    last colonoscopy in April 2013 was negative by Dr. Laural Benes  . Hemorrhoid surgery    . Breast surgery      lumpectomy- right  . Hemorrhoid surgery    . Artery biopsy  12/30/2011    Procedure: MINOR BIOPSY TEMPORAL ARTERY;  Surgeon: Currie Paris, MD;  Location: [facility];  Service: General;  Laterality: Right;  temoral artery biopsy -right side    REVIEW OF SYSTEMS:  A comprehensive review of systems was negative.   PHYSICAL EXAMINATION: General appearance: alert, cooperative and no distress Head:  Normocephalic, without obvious abnormality, atraumatic Neck: no adenopathy Lymph nodes: Cervical, supraclavicular, and axillary nodes normal. Resp: clear to auscultation bilaterally Cardio: regular rate and rhythm, S1, S2 normal, no murmur, click, rub or gallop GI: soft, non-tender; bowel sounds normal; no masses,  no organomegaly Extremities: extremities normal, atraumatic, no cyanosis or edema Neurologic: Alert and oriented X 3, normal strength and tone. Normal symmetric reflexes. Normal coordination and gait  ECOG PERFORMANCE STATUS: 1 - Symptomatic but completely ambulatory  Blood pressure  196/51, pulse 68, temperature 97.3 F (36.3 C), temperature source Oral, resp. rate 17, height 5\' 3"  (1.6 m), weight 104 lb 11.2 oz (47.492 kg).  LABORATORY DATA: Lab Results  Component Value Date   WBC 6.1 06/22/2012   HGB 10.4* 06/22/2012   HCT 32.7* 06/22/2012   MCV 84.7 06/22/2012   PLT 389 06/22/2012      Chemistry      Component Value Date/Time   NA 134* 06/07/2011 1007   K 4.5 06/07/2011 1007   CL 96 06/07/2011 1007   CO2 27 06/07/2011 1007   BUN 32* 06/07/2011 1007   CREATININE 0.98 06/07/2011 1007      Component Value Date/Time   CALCIUM 9.4 06/07/2011 1007   ALKPHOS 70 06/07/2011 1007   AST 21 06/07/2011 1007   ALT 12 06/07/2011 1007   BILITOT 0.4 06/07/2011 1007       RADIOGRAPHIC STUDIES: Dg Chest 1 View  05/25/2012  *RADIOLOGY REPORT*  Clinical Data: Post thoracentesis.  CHEST - 1 VIEW  Comparison: 05/04/2012  Findings: There is no evidence for a pneumothorax.  Again noted is a mass in the right upper chest.  Stable appearance of the heart and mediastinum.  Trachea is midline.  IMPRESSION: No evidence for a pneumothorax following thoracentesis.  Minimal fluid or blunting at the right lung base.  Right lung mass.   Original Report Authenticated By: Richarda Overlie, M.D.    Ct Biopsy  05/25/2012  *RADIOLOGY REPORT*  Clinical data:  Right upper lobe mass  CT-GUIDED LUNG CORE BIOPSY  Comparison:  PET CT 05/18/2012 and earlier studies  Technique and findings: The procedure, risks (including but not limited to bleeding, infection, organ damage, pneumothorax, chest tube), benefits, and alternatives were explained to the patient. Questions regarding the procedure were encouraged and answered. The patient understands and consents to the procedure.The patient placed prone and select axial scans through the upper thorax obtained.  The lesion was localized and an appropriate skin entry site identified. Operator donned sterile gloves and mask.   Site was marked, prepped with Betadine, draped in usual  sterile fashion, infiltrated locally with 1% lidocaine.  Intravenous Fentanyl and Versed were administered as conscious sedation during continuous cardiorespiratory monitoring by the radiology RN, with a total moderate sedation time of six minutes.  Under CT fluoroscopic guidance, a 17 gauge trocar needle was advanced to the margin of the lesion.  Once needle tip position was confirmed, coaxial 18-gauge core biopsy samples were obtained, submitted in formalin to  surgical pathology.  The guide needle was removed.  Postprocedure scans show no pneumothorax or hemorrhage. The patient tolerated the procedure well.  IMPRESSION: Technically successful CT-guided right upper lobe lung lesion core biopsy.   Original Report Authenticated By: D. Andria Rhein, MD    US Thoracentesis Asp Pleural Space W/img Guide  05/25/2012  *RADIOLOGY REPORT*  Clinical Data:  Right-sided pleural effusion, right lung mass. Request for thoracentesis  ULTRASOUND GUIDED right THORACENTESIS  Comparison:  CT right lung biopsy earlier  today  An ultrasound guided thoracentesis was thoroughly discussed with the patient and questions answered.  The benefits, risks, alternatives and complications were also discussed.  The patient understands and wishes to proceed with the procedure.  Written consent was obtained.  Ultrasound was performed to localize and mark an adequate pocket of fluid in the right chest.  The area was then prepped and draped in the normal sterile fashion.  1% Lidocaine was used for local anesthesia.  Under ultrasound guidance a 19 gauge Yueh catheter was introduced.  Thoracentesis was performed.  The catheter was removed and a dressing applied.  Complications:  None  Findings: A total of approximately 350 ml of blood tinged fluid was removed. A fluid sample was sent for laboratory analysis.  IMPRESSION: Successful ultrasound guided right thoracentesis yielding 350 ml of pleural fluid.  Read by Brayton El PA-C   Original Report  Authenticated By: D. Andria Rhein, MD     ASSESSMENT: This is a very pleasant 79 years old white female with metastatic non-small cell lung cancer, adenocarcinoma with negative EGFR mutation and negative ALK gene translocation. The patient is currently undergoing palliative radiotherapy to the right upper lobe lung mass.   PLAN: I have a lengthy discussion with the patient and her daughter today about her disease stage, prognosis and treatment options. I recommended for the patient's systemic chemotherapy with carboplatin for AUC of 5 and Alimta 500 mg/M2 every 3 weeks. We'll start the first cycle of this treatment after completion of her radiotherapy. I discussed with the patient adverse effect of the chemotherapy including but not limited to alopecia, myelosuppression, nausea and vomiting, peripheral neuropathy, liver or renal dysfunction. She would like to proceed with treatment as planned and she is expected to start the first cycle of this treatment on 07/10/2012. The patient will receive chemotherapy education class before starting the first cycle of her chemotherapy. The patient will receive vitamin B12 injection today. I would also to her pharmacy was prescription for Compazine 10 mg by mouth every 6 hours as needed for nausea, folic acid 1 mg by mouth daily and addition to Decadron 4 mg by mouth twice a day the day before, day of and day after the chemotherapy every 3 weeks. She would come back for followup visit with the start of the first cycle of her chemotherapy. She was advised to call immediately if she has any concerning symptoms in the interval.  All questions were answered. The patient knows to call the clinic with any problems, questions or concerns. We can certainly see the patient much sooner if necessary.

## 2012-06-23 ENCOUNTER — Ambulatory Visit
Admission: RE | Admit: 2012-06-23 | Discharge: 2012-06-23 | Disposition: A | Discharge status: Home / Self Care | Payer: Medicare Other | Source: Ambulatory Visit | Attending: Radiation Oncology | Admitting: Radiation Oncology

## 2012-06-23 NOTE — Progress Notes (Signed)
   Department of Radiation Oncology  Phone:  [phone] Fax:        [phone]  Weekly Treatment Note    Name: [name] Date: 06/23/2012 MRN: [id number] DOB: [birth year]   Current dose: 17.5 Gy  Current fraction: 7   MEDICATIONS: Current Outpatient Prescriptions  Medication Sig Dispense Refill  . Ascorbic Acid (VITAMIN C PO) Take 1 tablet by mouth daily.      Marland Kitchen BENICAR HCT 40-12.5 MG per tablet Take 1 tablet by mouth daily.       Marland Kitchen FeFum-FePoly-FA-B Cmp-C-Biot (INTEGRA PLUS) CAPS Take 1 capsule by mouth every morning.  30 capsule  2  . felodipine (PLENDIL) 10 MG 24 hr tablet Take 10 mg by mouth daily.       . fish oil-omega-3 fatty acids 1000 MG capsule Take 1 g by mouth daily.      . folic acid (FOLVITE) 1 MG tablet Take 1 tablet (1 mg total) by mouth daily.  30 tablet  4  . IRON PO Take 1 tablet by mouth daily.      . Multiple Vitamin (MULTIVITAMIN WITH MINERALS) TABS Take 1 tablet by mouth daily.      Marland Kitchen VITAMIN D, CHOLECALCIFEROL, PO Take 1 tablet by mouth daily.       . Vitamin Mixture (VITAMIN E COMPLETE) CAPS Take 1 capsule by mouth daily.       Marland Kitchen dexamethasone (DECADRON) 4 MG tablet 4 Milligram by mouth twice a day the day before, day of and day after the chemotherapy every 3 weeks  40 tablet  1  . hyaluronate sodium (RADIAPLEXRX) GEL Apply topically 2 (two) times daily.      . prochlorperazine (COMPAZINE) 10 MG tablet Take 1 tablet (10 mg total) by mouth every 6 (six) hours as needed.  60 tablet  0   No current facility-administered medications for this encounter.     ALLERGIES: Review of patient's allergies indicates no known allergies.   LABORATORY DATA:  Lab Results  Component Value Date   WBC 6.1 06/22/2012   HGB 10.4* 06/22/2012   HCT 32.7* 06/22/2012   MCV 84.7 06/22/2012   PLT 389 06/22/2012   Lab Results  Component Value Date   NA 130* 06/22/2012   K 4.6 06/22/2012   CL 95* 06/22/2012   CO2 27 06/22/2012   Lab Results  Component Value Date   ALT 8  06/22/2012   AST 17 06/22/2012   ALKPHOS 109 06/22/2012   BILITOT 0.30 06/22/2012     NARRATIVE: Sylvia [name] was seen today for weekly treatment management. The chart was checked and the patient's films were reviewed. The patient complains of some fatigue. Overall states that she feels a little worse with radiation but no specific nausea.  PHYSICAL EXAMINATION: height is 5\' 3"  (1.6 m) and weight is 105 lb 11.2 oz (47.945 kg). Her temperature is 97.8 F (36.6 C). Her blood pressure is 120/35 and her pulse is 65. Her oxygen saturation is 94%.        ASSESSMENT: The patient is doing satisfactorily with treatment.  PLAN: We will continue with the patient's radiation treatment as planned.

## 2012-06-23 NOTE — Progress Notes (Addendum)
Sylvia [name] here for weekly under treat visit.  She has had 7/15 fractions to her right lung.  She denies pain.  She does have fatigue and states that she feels "sick."  She denies shortness of breath.  She does report swelling in her feet and ankles.  Her left ankle is more swollen than the right.  She reports that her skin in intact and that she has not been using the Radiaplex gel.  Advised [name] to start using the radiaplex gel twice a day.

## 2012-06-24 ENCOUNTER — Ambulatory Visit
Admission: RE | Admit: 2012-06-24 | Discharge: 2012-06-24 | Disposition: A | Discharge status: Home / Self Care | Payer: Medicare Other | Source: Ambulatory Visit | Attending: Radiation Oncology | Admitting: Radiation Oncology

## 2012-06-24 ENCOUNTER — Encounter: Payer: Self-pay | Admitting: Internal Medicine

## 2012-06-24 NOTE — Patient Instructions (Signed)
We discussed systemic chemotherapy with carboplatin and Alimta. First cycle expected on 07/10/2012.

## 2012-06-26 ENCOUNTER — Other Ambulatory Visit: Payer: Self-pay | Admitting: *Deleted

## 2012-06-26 ENCOUNTER — Ambulatory Visit
Admission: RE | Admit: 2012-06-26 | Discharge: 2012-06-26 | Disposition: A | Discharge status: Home / Self Care | Payer: Medicare Other | Source: Ambulatory Visit | Attending: Radiation Oncology | Admitting: Radiation Oncology

## 2012-06-26 ENCOUNTER — Telehealth: Payer: Self-pay | Admitting: *Deleted

## 2012-06-26 NOTE — Progress Notes (Signed)
  Radiation Oncology         [phone]) [phone] ________________________________  Name: Sylvia [name] MRN: [id number]  Date: 06/07/2012  DOB: 05-15-1926  SIMULATION AND TREATMENT PLANNING NOTE  DIAGNOSIS:  Lung cancer  NARRATIVE:  The patient was brought to the CT Simulation planning suite.  Identity was confirmed.  All relevant records and images related to the planned course of therapy were reviewed.   Written consent to proceed with treatment was confirmed which was freely given after reviewing the details related to the planned course of therapy had been reviewed with the patient.  Then, the patient was set-up in a stable reproducible supine position for radiation therapy.  The patient's arms were raised above using a wing board device. CT images were obtained.  An isocenter was placed within the chest in relation to the target volume. Surface markings were placed.    The CT images were loaded into the planning software.  Then the target and avoidance structures were contoured.  Treatment planning then occurred.  The radiation prescription was entered and confirmed.  A total of 3 complex treatment devices were fabricated which correspond to the designed customized radiation treatment fields. Each of these customized fields/ complex treatment devices will be used on a daily basis during the radiation course. I have requested a 3D Simulation.  I have requested a DVH of the following structures: target volume, spinal cord, lungs, esophagus.   PLAN:  The patient will initially receive 37.5 Gy in 15 fractions.  The patient will receive chemotherapy during the course of radiation treatment. The patient may experience increased or overlapping toxicity due to this combined-modality approach and the patient will be monitored for such problems. This may include extra lab work as necessary. This therefore constitutes a special treatment procedure.   ________________________________

## 2012-06-26 NOTE — Telephone Encounter (Signed)
Pt called regarding swelling in lower legs, headache and nausea.  Dr. Arbutus Ped aware and looked at recent head scan.  He stated for pt to elevate legs to help with leg swelling.

## 2012-06-26 NOTE — Progress Notes (Signed)
  Radiation Oncology         [phone]) [phone] ________________________________  Name: Sylvia [name] MRN: [id number]  Date: 06/07/2012  DOB: 1926/03/16  RESPIRATORY MOTION MANAGEMENT SIMULATION  NARRATIVE:  In order to account for effect of respiratory motion on target structures and other organs in the planning and delivery of radiotherapy, this patient underwent respiratory motion management simulation.  To accomplish this, when the patient was brought to the CT simulation planning suite, 4D respiratoy motion management CT images were obtained.  The CT images were loaded into the planning software.  Then, using a variety of tools including Cine, MIP, and standard views, the target volume and planning target volumes (PTV) were delineated.  Avoidance structures were contoured.  Treatment planning then occurred.  Dose volume histograms were generated and reviewed for each of the requested structure.  The resulting plan was carefully reviewed and approved today.   Dorothy Puffer, M.D.

## 2012-06-27 ENCOUNTER — Ambulatory Visit
Admission: RE | Admit: 2012-06-27 | Discharge: 2012-06-27 | Disposition: A | Discharge status: Home / Self Care | Payer: Medicare Other | Source: Ambulatory Visit | Attending: Radiation Oncology | Admitting: Radiation Oncology

## 2012-06-27 ENCOUNTER — Telehealth: Payer: Self-pay | Admitting: *Deleted

## 2012-06-27 NOTE — Telephone Encounter (Signed)
Per staff message and POF I have scheduled appts.  JMW  

## 2012-06-27 NOTE — Progress Notes (Addendum)
Weekly Management Note Current Dose: 22.5  Gy  Projected Dose: 37.5 Gy   Narrative:  The patient presents for routine under treatment assessment.  CBCT/MVCT images/Port film x-rays were reviewed.  The chart was checked. Asked to be seen for nausea and just not feeling well. ? Due to iron pill or reflux symptoms. Has taken pepcid in the past but not taking now. Congestion and leg pain continue. No dysphagia.   Physical Findings: Pleasant, no distress.  Wt Readings from Last 3 Encounters:  06/23/12 105 lb 11.2 oz (47.945 kg)  06/22/12 104 lb 11.2 oz (47.492 kg)  06/16/12 105 lb 4.8 oz (47.764 kg)    Impression:  The patient is tolerating radiation. Reviewed plan. Not treating near stomach or esophagus.  Plan:  Continue treatment as planned. Start pepcid. Seeing moody on Friday. Possibly due to iron pill as well.

## 2012-06-27 NOTE — Progress Notes (Addendum)
Sylvia [name] here because she is not feeling well.  She has had 9/15 fractions to her right lung.  She state yesterday she was not feeling well and this morning she felt worse.  She is feeling dizzy and her stomach does not feel well.  She has not vomited.  She is able to eat.  She is fatigued.  Both of her ankles and feet are swollen with the left greater than the right.  She said this started about 5 days ago.  She has chronic pain in her upper legs.  She also started taking an iron pill two days ago that Dr. Mitzi Hansen wanted her to take.  She states she has to take laxatives for bowel movements.  She denies shortness of breath.  Her vitals are 139/30 and hr of 60, 127/66 and 66 standing.

## 2012-06-28 ENCOUNTER — Ambulatory Visit
Admission: RE | Admit: 2012-06-28 | Discharge: 2012-06-28 | Disposition: A | Discharge status: Home / Self Care | Payer: Medicare Other | Source: Ambulatory Visit | Attending: Radiation Oncology | Admitting: Radiation Oncology

## 2012-06-29 ENCOUNTER — Ambulatory Visit
Admission: RE | Admit: 2012-06-29 | Discharge: 2012-06-29 | Disposition: A | Discharge status: Home / Self Care | Payer: Medicare Other | Source: Ambulatory Visit | Attending: Radiation Oncology | Admitting: Radiation Oncology

## 2012-06-30 ENCOUNTER — Ambulatory Visit
Admission: RE | Admit: 2012-06-30 | Discharge: 2012-06-30 | Disposition: A | Discharge status: Home / Self Care | Payer: Medicare Other | Source: Ambulatory Visit | Attending: Radiation Oncology | Admitting: Radiation Oncology

## 2012-06-30 NOTE — Progress Notes (Signed)
   Department of Radiation Oncology  Phone:  [phone] Fax:        [phone]  Weekly Treatment Note    Name: Sylvia [name] Date: 06/30/2012 MRN: [id number] DOB: Jul 14, 1926   Current dose: 30 Gy  Current fraction: 12   MEDICATIONS: Current Outpatient Prescriptions  Medication Sig Dispense Refill  . Ascorbic Acid (VITAMIN C PO) Take 1 tablet by mouth daily.      Marland Kitchen BENICAR HCT 40-12.5 MG per tablet Take 1 tablet by mouth daily.       . felodipine (PLENDIL) 10 MG 24 hr tablet Take 10 mg by mouth daily.       . fish oil-omega-3 fatty acids 1000 MG capsule Take 1 g by mouth daily.      . folic acid (FOLVITE) 1 MG tablet Take 1 tablet (1 mg total) by mouth daily.  30 tablet  4  . hyaluronate sodium (RADIAPLEXRX) GEL Apply topically 2 (two) times daily.      . IRON PO Take 1 tablet by mouth daily.      . Multiple Vitamin (MULTIVITAMIN WITH MINERALS) TABS Take 1 tablet by mouth daily.      Marland Kitchen VITAMIN D, CHOLECALCIFEROL, PO Take 1 tablet by mouth daily.       . Vitamin Mixture (VITAMIN E COMPLETE) CAPS Take 1 capsule by mouth daily.       Marland Kitchen dexamethasone (DECADRON) 4 MG tablet 4 Milligram by mouth twice a day the day before, day of and day after the chemotherapy every 3 weeks  40 tablet  1  . FeFum-FePoly-FA-B Cmp-C-Biot (INTEGRA PLUS) CAPS Take 1 capsule by mouth every morning.  30 capsule  2  . prochlorperazine (COMPAZINE) 10 MG tablet Take 1 tablet (10 mg total) by mouth every 6 (six) hours as needed.  60 tablet  0   No current facility-administered medications for this encounter.     ALLERGIES: Review of patient's allergies indicates no known allergies.   LABORATORY DATA:  Lab Results  Component Value Date   WBC 6.1 06/22/2012   HGB 10.4* 06/22/2012   HCT 32.7* 06/22/2012   MCV 84.7 06/22/2012   PLT 389 06/22/2012   Lab Results  Component Value Date   NA 130* 06/22/2012   K 4.6 06/22/2012   CL 95* 06/22/2012   CO2 27 06/22/2012   Lab Results  Component Value Date   ALT 8  06/22/2012   AST 17 06/22/2012   ALKPHOS 109 06/22/2012   BILITOT 0.30 06/22/2012     NARRATIVE: Sylvia [name] was seen today for weekly treatment management. The chart was checked and the patient's films were reviewed. The patient states that she is doing well. Really no esophagitis at this point. She describes some increased sensitivity of the skin in the breast region. Otherwise no new complaints.  PHYSICAL EXAMINATION: height is 5\' 3"  (1.6 m) and weight is 104 lb 6.4 oz (47.356 kg). Her temperature is 97.4 F (36.3 C). Her blood pressure is 133/68 and her pulse is 62. Her oxygen saturation is 94%.      dictation bilaterally  ASSESSMENT: The patient is doing satisfactorily with treatment.  PLAN: We will continue with the patient's radiation treatment as planned.

## 2012-06-30 NOTE — Progress Notes (Addendum)
Sylvia [name] here for weekly under treat visit.  She has had 12/15 fractions to her right lung.  She is have discomfort in both of her breasts.  She says it is a sharp pain every so often and a soreness.  She says the left is worse that her right and the discomfort has increased since she has had radiation.  She does have fatigue.  She states her appetite is poor.  She does have a cough and is bringing up white phlegm.  She denies shortness of breath, nausea and a sore throat.  She states her skin is intact.

## 2012-07-03 ENCOUNTER — Ambulatory Visit
Admission: RE | Admit: 2012-07-03 | Discharge: 2012-07-03 | Disposition: A | Discharge status: Home / Self Care | Payer: Medicare Other | Source: Ambulatory Visit | Attending: Radiation Oncology | Admitting: Radiation Oncology

## 2012-07-04 ENCOUNTER — Ambulatory Visit
Admission: RE | Admit: 2012-07-04 | Discharge: 2012-07-04 | Disposition: A | Discharge status: Home / Self Care | Payer: Medicare Other | Source: Ambulatory Visit | Attending: Radiation Oncology | Admitting: Radiation Oncology

## 2012-07-05 ENCOUNTER — Ambulatory Visit
Admission: RE | Admit: 2012-07-05 | Discharge: 2012-07-05 | Disposition: A | Discharge status: Home / Self Care | Payer: Medicare Other | Source: Ambulatory Visit | Attending: Radiation Oncology | Admitting: Radiation Oncology

## 2012-07-05 ENCOUNTER — Encounter: Payer: Self-pay | Admitting: Radiation Oncology

## 2012-07-05 NOTE — Progress Notes (Signed)
Sylvia [name] here for her final treatment visit.  She has had 15/15 fractions to her right lung.  She denies pain.  She states she feels a little queasy but does not really have any nausea.  She denies a sore throat and shortness of breath.  She says the skin in the treatment area is a little pink.  She is using the radiaplex gel twice a day.  She does have mild edema in both ankles and feet.

## 2012-07-05 NOTE — Progress Notes (Signed)
   Department of Radiation Oncology  Phone:  [phone] Fax:        [phone]  Weekly Treatment Note    Name: Sylvia [name] Date: 07/05/2012 MRN: [id number] DOB: 12-20-1926   Current dose: 37.5 Gy  Current fraction: 15   MEDICATIONS: Current Outpatient Prescriptions  Medication Sig Dispense Refill  . Ascorbic Acid (VITAMIN C PO) Take 1 tablet by mouth daily.      Marland Kitchen BENICAR HCT 40-12.5 MG per tablet Take 1 tablet by mouth daily.       . felodipine (PLENDIL) 10 MG 24 hr tablet Take 10 mg by mouth daily.       . fish oil-omega-3 fatty acids 1000 MG capsule Take 1 g by mouth daily.      . folic acid (FOLVITE) 1 MG tablet Take 1 tablet (1 mg total) by mouth daily.  30 tablet  4  . hyaluronate sodium (RADIAPLEXRX) GEL Apply topically 2 (two) times daily.      . IRON PO Take 1 tablet by mouth daily.      . Multiple Vitamin (MULTIVITAMIN WITH MINERALS) TABS Take 1 tablet by mouth daily.      . prochlorperazine (COMPAZINE) 10 MG tablet Take 1 tablet (10 mg total) by mouth every 6 (six) hours as needed.  60 tablet  0  . VITAMIN D, CHOLECALCIFEROL, PO Take 1 tablet by mouth daily.       . Vitamin Mixture (VITAMIN E COMPLETE) CAPS Take 1 capsule by mouth daily.       Marland Kitchen dexamethasone (DECADRON) 4 MG tablet 4 Milligram by mouth twice a day the day before, day of and day after the chemotherapy every 3 weeks  40 tablet  1  . FeFum-FePoly-FA-B Cmp-C-Biot (INTEGRA PLUS) CAPS Take 1 capsule by mouth every morning.  30 capsule  2   No current facility-administered medications for this encounter.     ALLERGIES: Review of patient's allergies indicates no known allergies.   LABORATORY DATA:  Lab Results  Component Value Date   WBC 6.1 06/22/2012   HGB 10.4* 06/22/2012   HCT 32.7* 06/22/2012   MCV 84.7 06/22/2012   PLT 389 06/22/2012   Lab Results  Component Value Date   NA 130* 06/22/2012   K 4.6 06/22/2012   CL 95* 06/22/2012   CO2 27 06/22/2012   Lab Results  Component Value Date   ALT 8  06/22/2012   AST 17 06/22/2012   ALKPHOS 109 06/22/2012   BILITOT 0.30 06/22/2012     NARRATIVE: Sylvia [name] was seen today for weekly treatment management. The chart was checked and the patient's films were reviewed. The patient finished her final fraction today. She did very well. She continues to deny any difficulties with esophagitis. She is using skin cream twice a day.  PHYSICAL EXAMINATION: height is 5\' 3"  (1.6 m) and weight is 105 lb 8 oz (47.854 kg). Her temperature is 97.7 F (36.5 C). Her blood pressure is 135/45 and her pulse is 62. Her oxygen saturation is 96%.        ASSESSMENT: The patient did satisfactorily with treatment.  PLAN: Followup in one month. The patient is to let us know if she begins experiencing some esophagitis, and we can call in a prescription for Carafate if necessary.

## 2012-07-06 ENCOUNTER — Encounter: Payer: Self-pay | Admitting: *Deleted

## 2012-07-06 ENCOUNTER — Other Ambulatory Visit: Payer: Medicare Other

## 2012-07-07 ENCOUNTER — Telehealth: Payer: Self-pay | Admitting: Internal Medicine

## 2012-07-07 NOTE — Telephone Encounter (Signed)
Talked to pt and gave her appt for lab, MD and chemo on 07/12/12 and advised pt to get appt calendar for june 2014

## 2012-07-11 ENCOUNTER — Other Ambulatory Visit: Payer: Self-pay | Admitting: Pharmacist

## 2012-07-11 ENCOUNTER — Ambulatory Visit: Payer: Medicare Other | Admitting: Internal Medicine

## 2012-07-11 DIAGNOSIS — C3490 Malignant neoplasm of unspecified part of unspecified bronchus or lung: Secondary | ICD-10-CM

## 2012-07-12 ENCOUNTER — Ambulatory Visit (HOSPITAL_BASED_OUTPATIENT_CLINIC_OR_DEPARTMENT_OTHER): Payer: Medicare Other | Admitting: Internal Medicine

## 2012-07-12 ENCOUNTER — Other Ambulatory Visit: Payer: Self-pay | Admitting: Medical Oncology

## 2012-07-12 ENCOUNTER — Ambulatory Visit (HOSPITAL_BASED_OUTPATIENT_CLINIC_OR_DEPARTMENT_OTHER): Payer: Medicare Other

## 2012-07-12 ENCOUNTER — Telehealth: Payer: Self-pay | Admitting: Internal Medicine

## 2012-07-12 ENCOUNTER — Other Ambulatory Visit (HOSPITAL_BASED_OUTPATIENT_CLINIC_OR_DEPARTMENT_OTHER): Payer: Medicare Other | Admitting: Lab

## 2012-07-12 ENCOUNTER — Encounter: Payer: Self-pay | Admitting: Internal Medicine

## 2012-07-12 DIAGNOSIS — C771 Secondary and unspecified malignant neoplasm of intrathoracic lymph nodes: Secondary | ICD-10-CM

## 2012-07-12 DIAGNOSIS — C3410 Malignant neoplasm of upper lobe, unspecified bronchus or lung: Secondary | ICD-10-CM

## 2012-07-12 DIAGNOSIS — Z5111 Encounter for antineoplastic chemotherapy: Secondary | ICD-10-CM

## 2012-07-12 DIAGNOSIS — R5381 Other malaise: Secondary | ICD-10-CM

## 2012-07-12 LAB — CBC WITH DIFFERENTIAL/PLATELET
Basophils Absolute: 0 10*3/uL (ref 0.0–0.1)
EOS%: 0 % (ref 0.0–7.0)
Eosinophils Absolute: 0 10*3/uL (ref 0.0–0.5)
HGB: 10.7 g/dL — ABNORMAL LOW (ref 11.6–15.9)
MONO#: 0.1 10*3/uL (ref 0.1–0.9)
NEUT#: 5.1 10*3/uL (ref 1.5–6.5)
RDW: 15.4 % — ABNORMAL HIGH (ref 11.2–14.5)
lymph#: 0.2 10*3/uL — ABNORMAL LOW (ref 0.9–3.3)

## 2012-07-12 LAB — COMPREHENSIVE METABOLIC PANEL (CC13)
Albumin: 3.4 g/dL — ABNORMAL LOW (ref 3.5–5.0)
Alkaline Phosphatase: 104 U/L (ref 40–150)
CO2: 24 mEq/L (ref 22–29)
Calcium: 9.4 mg/dL (ref 8.4–10.4)
Chloride: 92 mEq/L — ABNORMAL LOW (ref 98–107)
Glucose: 157 mg/dl — ABNORMAL HIGH (ref 70–99)
Potassium: 4.8 mEq/L (ref 3.5–5.1)
Sodium: 127 mEq/L — ABNORMAL LOW (ref 136–145)
Total Protein: 7.7 g/dL (ref 6.4–8.3)

## 2012-07-12 MED ORDER — SODIUM CHLORIDE 0.9 % IV SOLN
299.5000 mg | Freq: Once | INTRAVENOUS | Status: AC
  Administered 2012-07-12: 300 mg via INTRAVENOUS
  Filled 2012-07-12: qty 30

## 2012-07-12 MED ORDER — SODIUM CHLORIDE 0.9 % IV SOLN
375.0000 mg/m2 | Freq: Once | INTRAVENOUS | Status: AC
  Administered 2012-07-12: 550 mg via INTRAVENOUS
  Filled 2012-07-12: qty 22

## 2012-07-12 MED ORDER — ONDANSETRON 16 MG/50ML IVPB (CHCC)
16.0000 mg | Freq: Once | INTRAVENOUS | Status: AC
  Administered 2012-07-12: 16 mg via INTRAVENOUS

## 2012-07-12 MED ORDER — DEXAMETHASONE SODIUM PHOSPHATE 20 MG/5ML IJ SOLN
20.0000 mg | Freq: Once | INTRAMUSCULAR | Status: AC
  Administered 2012-07-12: 20 mg via INTRAVENOUS

## 2012-07-12 MED ORDER — SODIUM CHLORIDE 0.9 % IV SOLN
Freq: Once | INTRAVENOUS | Status: AC
  Administered 2012-07-12: 13:00:00 via INTRAVENOUS

## 2012-07-12 NOTE — Progress Notes (Signed)
[facility] [phone]   [phone]  OFFICE PROGRESS NOTE  SHAW,KIMBERLEE, MD [address] [place] [address]  DIAGNOSIS: Metastatic non-small cell lung cancer, adenocarcinoma, negative EGFR mutation, negative ALK gene translocation, presented with right upper lobe lung mass as well as mediastinal lymphadenopathy and left lower lobe lesion diagnosed in April of 2014.   PRIOR THERAPY: Palliative radiotherapy under the care of Dr. Mitzi Hansen to the right upper lobe lung mass and mediastinum expected to be completed on 07/05/2012   CURRENT THERAPY: The patient will start systemic chemotherapy with carboplatin for AUC of 5 and Alimta 500 mg/M2 on 07/12/2012  INTERVAL HISTORY: Sylvia [name] 79 y.o. female returns to the clinic today for followup visit accompanied by her son. The patient is feeling fine today with no specific complaints. She bit more tired because she did not sleep last night. She denied having any significant chest pain, shortness breath, cough or hemoptysis. She has no weight loss or night sweats. Patient is here today to start the first cycle of her systemic chemotherapy with carboplatin and Alimta.  MEDICAL HISTORY: Past Medical History  Diagnosis Date  . HTN (hypertension)   . Renal insufficiency   . IBS (irritable bowel syndrome)   . Anemia 06/2010  . Raynaud's syndrome   . PVD (peripheral vascular disease)   . Carotid stenosis   . Raynaud phenomenon since 1990's  . Hoarseness of voice   . PVD (peripheral vascular disease)     ALLERGIES:  has No Known Allergies.  MEDICATIONS:  Current Outpatient Prescriptions  Medication Sig Dispense Refill  . Ascorbic Acid (VITAMIN C PO) Take 1 tablet by mouth daily.      Marland Kitchen BENICAR HCT 40-12.5 MG per tablet Take 1 tablet by mouth daily.       Marland Kitchen dexamethasone (DECADRON) 4 MG tablet 4 Milligram by mouth twice a day the day before, day of and day after the chemotherapy every  3 weeks  40 tablet  1  . felodipine (PLENDIL) 10 MG 24 hr tablet Take 10 mg by mouth daily.       . fish oil-omega-3 fatty acids 1000 MG capsule Take 1 g by mouth daily.      . hyaluronate sodium (RADIAPLEXRX) GEL Apply topically 2 (two) times daily.      . IRON PO Take 1 tablet by mouth daily.      . Multiple Vitamin (MULTIVITAMIN WITH MINERALS) TABS Take 1 tablet by mouth daily.      Marland Kitchen VITAMIN D, CHOLECALCIFEROL, PO Take 1 tablet by mouth daily.       . Vitamin Mixture (VITAMIN E COMPLETE) CAPS Take 1 capsule by mouth daily.       . folic acid (FOLVITE) 1 MG tablet Take 1 tablet (1 mg total) by mouth daily.  30 tablet  4  . prochlorperazine (COMPAZINE) 10 MG tablet Take 1 tablet (10 mg total) by mouth every 6 (six) hours as needed.  60 tablet  0   No current facility-administered medications for this visit.    SURGICAL HISTORY:  Past Surgical History  Procedure Laterality Date  . Colonoscopy  02/2004 and 05/2011    last colonoscopy in April 2013 was negative by Dr. Laural Benes  . Hemorrhoid surgery    . Breast surgery      lumpectomy- right  . Hemorrhoid surgery    . Artery biopsy  12/30/2011    Procedure: MINOR BIOPSY TEMPORAL ARTERY;  Surgeon: Currie Paris, MD;  Location:  SURGERY CENTER;  Service: General;  Laterality: Right;  temoral artery biopsy -right side    REVIEW OF SYSTEMS:  A comprehensive review of systems was negative except for: Constitutional: positive for fatigue   PHYSICAL EXAMINATION: General appearance: alert, cooperative and no distress Head: Normocephalic, without obvious abnormality, atraumatic Neck: no adenopathy Lymph nodes: Cervical, supraclavicular, and axillary nodes normal. Resp: clear to auscultation bilaterally Cardio: regular rate and rhythm, S1, S2 normal, no murmur, click, rub or gallop GI: soft, non-tender; bowel sounds normal; no masses,  no organomegaly Extremities: extremities normal, atraumatic, no cyanosis or edema  ECOG  PERFORMANCE STATUS: 1 - Symptomatic but completely ambulatory  Blood pressure 134/61, pulse 69, temperature 97 F (36.1 C), temperature source Oral, resp. rate 20, height 5\' 3"  (1.6 m), weight 104 lb (47.174 kg).  LABORATORY DATA: Lab Results  Component Value Date   WBC 5.4 07/12/2012   HGB 10.7* 07/12/2012   HCT 32.3* 07/12/2012   MCV 81.6 07/12/2012   PLT 320 07/12/2012      Chemistry      Component Value Date/Time   NA 130* 06/22/2012 1111   NA 134* 06/07/2011 1007   K 4.6 06/22/2012 1111   K 4.5 06/07/2011 1007   CL 95* 06/22/2012 1111   CL 96 06/07/2011 1007   CO2 27 06/22/2012 1111   CO2 27 06/07/2011 1007   BUN 23.9 06/22/2012 1111   BUN 32* 06/07/2011 1007   CREATININE 0.9 06/22/2012 1111   CREATININE 0.98 06/07/2011 1007      Component Value Date/Time   CALCIUM 9.0 06/22/2012 1111   CALCIUM 9.4 06/07/2011 1007   ALKPHOS 109 06/22/2012 1111   ALKPHOS 70 06/07/2011 1007   AST 17 06/22/2012 1111   AST 21 06/07/2011 1007   ALT 8 06/22/2012 1111   ALT 12 06/07/2011 1007   BILITOT 0.30 06/22/2012 1111   BILITOT 0.4 06/07/2011 1007       RADIOGRAPHIC STUDIES: No results found.  ASSESSMENT: This is a very pleasant 79 years old white female with metastatic non-small cell lung cancer, adenocarcinoma.    PLAN: The patient is doing fine today. We'll proceed with the first cycle of her systemic chemotherapy with carboplatin and Alimta as scheduled. She would come back for followup visit in 3 weeks for evaluation and start of cycle #2. She was advised to call immediately if she has any concerning symptoms in the interval.  All questions were answered. The patient knows to call the clinic with any problems, questions or concerns. We can certainly see the patient much sooner if necessary.

## 2012-07-12 NOTE — Telephone Encounter (Signed)
gv and printed appt sched and avs for pt  °

## 2012-07-12 NOTE — Patient Instructions (Signed)
[facility] Discharge Instructions for Patients Receiving Chemotherapy  Today you received the following chemotherapy agents: Alimta and Carboplatin.  To help prevent nausea and vomiting after your treatment, we encourage you to take your nausea medication, Compazine. Take it every six hours as needed for nausea. This may make you drowsy.   If you develop nausea and vomiting that is not controlled by your nausea medication, call the clinic. If it is after clinic hours your family physician or the after hours number for the clinic or go to the Emergency Department.   BELOW ARE SYMPTOMS THAT SHOULD BE REPORTED IMMEDIATELY:  *FEVER GREATER THAN 100.5 F  *CHILLS WITH OR WITHOUT FEVER  NAUSEA AND VOMITING THAT IS NOT CONTROLLED WITH YOUR NAUSEA MEDICATION  *UNUSUAL SHORTNESS OF BREATH  *UNUSUAL BRUISING OR BLEEDING  TENDERNESS IN MOUTH AND THROAT WITH OR WITHOUT PRESENCE OF ULCERS  *URINARY PROBLEMS  *BOWEL PROBLEMS  UNUSUAL RASH Items with * indicate a potential emergency and should be followed up as soon as possible.  One of the nurses will contact you 24 hours after your treatment. Please let the nurse know about any problems that you may have experienced. Feel free to call the clinic you have any questions or concerns. The clinic phone number is [phone].   I have been informed and understand all the instructions given to me. I know to contact the clinic, my physician, or go to the Emergency Department if any problems should occur. I do not have any questions at this time, but understand that I may call the clinic during office hours   should I have any questions or need assistance in obtaining follow up care.

## 2012-07-13 ENCOUNTER — Telehealth: Payer: Self-pay | Admitting: *Deleted

## 2012-07-13 NOTE — Telephone Encounter (Signed)
Message copied by Augusto Garbe on Thu Jul 13, 2012  4:14 PM ------      Message from: Caleb Popp      Created: Wed Jul 12, 2012  3:22 PM      Regarding: chemo follow up call      Contact: [phone]       Alimta,Carbo 5/28 [facility])  ------

## 2012-07-13 NOTE — Telephone Encounter (Signed)
Called Ms Brasington who says she is doing well.  Denies ant problems at this time.  Encouraged to call if any changes.

## 2012-07-13 NOTE — Telephone Encounter (Signed)
Message copied by Augusto Garbe on Thu Jul 13, 2012  4:22 PM ------      Message from: Caleb Popp      Created: Wed Jul 12, 2012  3:22 PM      Regarding: chemo follow up call      Contact: [phone]       Alimta,Carbo 5/28 [facility])  ------

## 2012-07-17 ENCOUNTER — Telehealth: Payer: Self-pay | Admitting: *Deleted

## 2012-07-17 ENCOUNTER — Other Ambulatory Visit (HOSPITAL_BASED_OUTPATIENT_CLINIC_OR_DEPARTMENT_OTHER): Payer: Medicare Other | Admitting: Lab

## 2012-07-17 DIAGNOSIS — C3490 Malignant neoplasm of unspecified part of unspecified bronchus or lung: Secondary | ICD-10-CM

## 2012-07-17 LAB — CBC WITH DIFFERENTIAL/PLATELET
EOS%: 3.8 % (ref 0.0–7.0)
Eosinophils Absolute: 0.1 10*3/uL (ref 0.0–0.5)
MCV: 81.3 fL (ref 79.5–101.0)
MONO%: 2.5 % (ref 0.0–14.0)
NEUT#: 2.8 10*3/uL (ref 1.5–6.5)
RBC: 3.85 10*6/uL (ref 3.70–5.45)
RDW: 15.4 % — ABNORMAL HIGH (ref 11.2–14.5)

## 2012-07-17 LAB — COMPREHENSIVE METABOLIC PANEL (CC13)
AST: 25 U/L (ref 5–34)
Albumin: 3.1 g/dL — ABNORMAL LOW (ref 3.5–5.0)
Alkaline Phosphatase: 82 U/L (ref 40–150)
Glucose: 98 mg/dl (ref 70–99)
Potassium: 4.5 mEq/L (ref 3.5–5.1)
Sodium: 126 mEq/L — ABNORMAL LOW (ref 136–145)
Total Protein: 6.9 g/dL (ref 6.4–8.3)

## 2012-07-17 NOTE — Telephone Encounter (Signed)
Pt called today c/o fatigue.  We discussed energy conservation methods to help with fatigue

## 2012-07-18 ENCOUNTER — Encounter: Payer: Self-pay | Admitting: *Deleted

## 2012-07-18 ENCOUNTER — Telehealth: Payer: Self-pay | Admitting: *Deleted

## 2012-07-18 NOTE — Telephone Encounter (Signed)
Left vm message for pt to call

## 2012-07-19 ENCOUNTER — Telehealth: Payer: Self-pay | Admitting: Oncology

## 2012-07-19 NOTE — Telephone Encounter (Signed)
Returned [employer].  She has a itchy rash that starts at the base of her neck and spreads to her mid chest.  She is applying radiaplex.  Advised her that she can use hydrocortizone cream for the itching.  Will notify Dr. Mitzi Hansen.

## 2012-07-20 ENCOUNTER — Telehealth: Payer: Self-pay | Admitting: *Deleted

## 2012-07-20 ENCOUNTER — Encounter: Payer: Self-pay | Admitting: *Deleted

## 2012-07-20 ENCOUNTER — Other Ambulatory Visit: Payer: Self-pay | Admitting: *Deleted

## 2012-07-20 NOTE — Telephone Encounter (Signed)
Daughter called concerned with pt not eating.  I will discuss with Dr. Arbutus Ped and Vernell Leep for advice.

## 2012-07-20 NOTE — Telephone Encounter (Signed)
Left VM message let pt know Sylvia [name] will be calling and to see how pt was feeling

## 2012-07-20 NOTE — Telephone Encounter (Signed)
Spoke with pt regarding not feeling well.  I spoke with Dr. Arbutus Ped and gave her instructions to take 3 day for dexamethasone, like she take for chemotherapy.  She verbalized understanding

## 2012-07-21 ENCOUNTER — Telehealth: Payer: Self-pay | Admitting: Nutrition

## 2012-07-21 NOTE — Telephone Encounter (Signed)
I called patient on telephone to followup regarding oral intake per RN request. Patient is an 79 year old female diagnosed with metastatic non-small cell lung cancer. Her weight has been stable at 104 pounds since 05/25/2012. This does put her at a BMI of 18.43 which is underweight.  Patient describes nausea and inability to consume large amounts of food at one time. She seems to have more nausea with hot meals. Dietary recall reveals patient does consume a fairly healthy diet with good protein sources. She has not been taking nausea medication as prescribed. I educated patient to increase nausea medication as prescribed by physician. She was encouraged to consume cold foods more often in small amounts of food frequently throughout the day. Patient also educated to add in one yogurt smoothie daily as tolerated. I offered to mail information to patient however patient declined at this time. She states she has a lot of written material at her fingertips if she needs to refer to nutritional facts. I've encouraged patient to contact me if she has any more questions or concerns.

## 2012-07-21 NOTE — Progress Notes (Signed)
  Radiation Oncology         [phone] ________________________________  Name: Sylvia [name] MRN: [id number]  Date: 07/05/2012  DOB: 1926/12/13  End of Treatment Note  Diagnosis:   Lung cancer      Indication for treatment:  Palliative       Radiation treatment dates:   06/15/2012 through 07/05/2012  Site/dose:   The patient was treated to the right lung tumor to a dose of 37.5 gray in 15 fractions at 2.5 gray per fraction. This was accomplished using a 3 field, 3-D conformal technique.  Narrative: The patient tolerated radiation treatment relatively well.   The patient did not exhibit any significant difficulties in terms of worsening shortness of breath. No major difficulties with acute toxicity.  Plan: The patient has completed radiation treatment. The patient will return to radiation oncology clinic for routine followup in one month. I advised the patient to call or return sooner if they have any questions or concerns related to their recovery or treatment. ________________________________  Radene Gunning, M.D., Ph.D.

## 2012-07-24 ENCOUNTER — Ambulatory Visit (HOSPITAL_BASED_OUTPATIENT_CLINIC_OR_DEPARTMENT_OTHER): Payer: Medicare Other

## 2012-07-24 DIAGNOSIS — C3490 Malignant neoplasm of unspecified part of unspecified bronchus or lung: Secondary | ICD-10-CM

## 2012-07-24 LAB — CBC WITH DIFFERENTIAL/PLATELET
EOS%: 3.1 % (ref 0.0–7.0)
MCH: 27.6 pg (ref 25.1–34.0)
MCHC: 33.8 g/dL (ref 31.5–36.0)
MCV: 81.6 fL (ref 79.5–101.0)
MONO%: 34.3 % — ABNORMAL HIGH (ref 0.0–14.0)
RBC: 3.39 10*6/uL — ABNORMAL LOW (ref 3.70–5.45)
RDW: 15.8 % — ABNORMAL HIGH (ref 11.2–14.5)

## 2012-07-24 LAB — COMPREHENSIVE METABOLIC PANEL (CC13)
AST: 16 U/L (ref 5–34)
Albumin: 2.8 g/dL — ABNORMAL LOW (ref 3.5–5.0)
Alkaline Phosphatase: 82 U/L (ref 40–150)
BUN: 34.5 mg/dL — ABNORMAL HIGH (ref 7.0–26.0)
Potassium: 4.3 mEq/L (ref 3.5–5.1)
Sodium: 131 mEq/L — ABNORMAL LOW (ref 136–145)
Total Protein: 6.6 g/dL (ref 6.4–8.3)

## 2012-07-25 ENCOUNTER — Telehealth: Payer: Self-pay | Admitting: *Deleted

## 2012-07-25 NOTE — Telephone Encounter (Signed)
Spoke with pt regarding feelings of fatigue.  She states she feels tired.  Dr. Arbutus Ped aware, no new orders at this time.  I discussed with her energy conservation measures. She verbalized understanding.  She has an appt 07/31/12 with Dr. Arbutus Ped and to call if she feels worse.

## 2012-07-26 ENCOUNTER — Telehealth: Payer: Self-pay | Admitting: Medical Oncology

## 2012-07-26 ENCOUNTER — Ambulatory Visit (HOSPITAL_BASED_OUTPATIENT_CLINIC_OR_DEPARTMENT_OTHER): Payer: Medicare Other

## 2012-07-26 ENCOUNTER — Other Ambulatory Visit: Payer: Self-pay | Admitting: Medical Oncology

## 2012-07-26 ENCOUNTER — Ambulatory Visit (HOSPITAL_BASED_OUTPATIENT_CLINIC_OR_DEPARTMENT_OTHER): Payer: Medicare Other | Admitting: Lab

## 2012-07-26 DIAGNOSIS — D709 Neutropenia, unspecified: Secondary | ICD-10-CM

## 2012-07-26 DIAGNOSIS — C3410 Malignant neoplasm of upper lobe, unspecified bronchus or lung: Secondary | ICD-10-CM

## 2012-07-26 DIAGNOSIS — R51 Headache: Secondary | ICD-10-CM

## 2012-07-26 DIAGNOSIS — R5381 Other malaise: Secondary | ICD-10-CM

## 2012-07-26 LAB — CBC WITH DIFFERENTIAL/PLATELET
Basophils Absolute: 0 10*3/uL (ref 0.0–0.1)
EOS%: 3.9 % (ref 0.0–7.0)
Eosinophils Absolute: 0.1 10*3/uL (ref 0.0–0.5)
LYMPH%: 9.9 % — ABNORMAL LOW (ref 14.0–49.7)
MCH: 27.9 pg (ref 25.1–34.0)
MCV: 82.3 fL (ref 79.5–101.0)
MONO%: 22.2 % — ABNORMAL HIGH (ref 0.0–14.0)
NEUT#: 1.9 10*3/uL (ref 1.5–6.5)
Platelets: 111 10*3/uL — ABNORMAL LOW (ref 145–400)
RBC: 3.14 10*6/uL — ABNORMAL LOW (ref 3.70–5.45)

## 2012-07-26 MED ORDER — SODIUM CHLORIDE 0.9 % IV SOLN
INTRAVENOUS | Status: DC
  Administered 2012-07-26: 16:00:00 via INTRAVENOUS

## 2012-07-26 NOTE — Progress Notes (Signed)
1630 Patient upset d/t length of treatment for IVF states she wasn't told how long she would be here, but wasn't expecting 2 hours, MD informed and MD informed re patient's  B/P. Sylvia [name], nurse navigator spoke with patient.

## 2012-07-26 NOTE — Telephone Encounter (Signed)
Pt reports she feels "rotten, I just want to lie down, I feel queasy in stomach and have a slight headache" .She denies fever . Per Dr Arbutus Ped I told pt to to come in for lab today and IVF. I also told her to stop taking ibuprophen . She can take Tylenol .Pt understands.

## 2012-07-26 NOTE — Patient Instructions (Addendum)
Dehydration, Adult Dehydration is when you lose more fluids from the body than you take in. Vital organs like the kidneys, brain, and heart cannot function without a proper amount of fluids and salt. Any loss of fluids from the body can cause dehydration.  CAUSES   Vomiting.  Diarrhea.  Excessive sweating.  Excessive urine output.  Fever. SYMPTOMS  Mild dehydration  Thirst.  Dry lips.  Slightly dry mouth. Moderate dehydration  Very dry mouth.  Sunken eyes.  Skin does not bounce back quickly when lightly pinched and released.  Dark urine and decreased urine production.  Decreased tear production.  Headache. Severe dehydration  Very dry mouth.  Extreme thirst.  Rapid, weak pulse (more than 100 beats per minute at rest).  Cold hands and feet.  Not able to sweat in spite of heat and temperature.  Rapid breathing.  Blue lips.  Confusion and lethargy.  Difficulty being awakened.  Minimal urine production.  No tears. DIAGNOSIS  Your caregiver will diagnose dehydration based on your symptoms and your exam. Blood and urine tests will help confirm the diagnosis. The diagnostic evaluation should also identify the cause of dehydration. TREATMENT  Treatment of mild or moderate dehydration can often be done at home by increasing the amount of fluids that you drink. It is best to drink small amounts of fluid more often. Drinking too much at one time can make vomiting worse. Refer to the home care instructions below. Severe dehydration needs to be treated at the hospital where you will probably be given intravenous (IV) fluids that contain water and electrolytes. HOME CARE INSTRUCTIONS   Ask your caregiver about specific rehydration instructions.  Drink enough fluids to keep your urine clear or pale yellow.  Drink small amounts frequently if you have nausea and vomiting.  Eat as you normally do.  Avoid:  Foods or drinks high in sugar.  Carbonated  drinks.  Juice.  Extremely hot or cold fluids.  Drinks with caffeine.  Fatty, greasy foods.  Alcohol.  Tobacco.  Overeating.  Gelatin desserts.  Wash your hands well to avoid spreading bacteria and viruses.  Only take over-the-counter or prescription medicines for pain, discomfort, or fever as directed by your caregiver.  Ask your caregiver if you should continue all prescribed and over-the-counter medicines.  Keep all follow-up appointments with your caregiver. SEEK MEDICAL CARE IF:  You have abdominal pain and it increases or stays in one area (localizes).  You have a rash, stiff neck, or severe headache.  You are irritable, sleepy, or difficult to awaken.  You are weak, dizzy, or extremely thirsty. SEEK IMMEDIATE MEDICAL CARE IF:   You are unable to keep fluids down or you get worse despite treatment.  You have frequent episodes of vomiting or diarrhea.  You have blood or green matter (bile) in your vomit.  You have blood in your stool or your stool looks black and tarry.  You have not urinated in 6 to 8 hours, or you have only urinated a small amount of very dark urine.  You have a fever.  You faint. MAKE SURE YOU:   Understand these instructions.  Will watch your condition.  Will get help right away if you are not doing well or get worse. Document Released: 02/01/2005 Document Revised: 04/26/2011 Document Reviewed: 09/21/2010 [employer] Patient Information 2014 [employer].  

## 2012-07-26 NOTE — Progress Notes (Signed)
1830 Patient with no complaints of dizziness or nausea. B/P elevated, no change compared to when started. Patient d/c ambulating. Knows to call MD with any questions or concerns.

## 2012-07-28 ENCOUNTER — Telehealth: Payer: Self-pay | Admitting: Medical Oncology

## 2012-07-28 NOTE — Telephone Encounter (Signed)
I tried to contact pt to see how she is feeling- no one answered the phone.

## 2012-07-31 ENCOUNTER — Encounter: Payer: Self-pay | Admitting: Internal Medicine

## 2012-07-31 ENCOUNTER — Telehealth: Payer: Self-pay | Admitting: *Deleted

## 2012-07-31 ENCOUNTER — Other Ambulatory Visit: Payer: Medicare Other

## 2012-07-31 ENCOUNTER — Ambulatory Visit (HOSPITAL_BASED_OUTPATIENT_CLINIC_OR_DEPARTMENT_OTHER): Payer: Medicare Other | Admitting: Internal Medicine

## 2012-07-31 ENCOUNTER — Encounter: Payer: Self-pay | Admitting: *Deleted

## 2012-07-31 ENCOUNTER — Telehealth: Payer: Self-pay | Admitting: Internal Medicine

## 2012-07-31 ENCOUNTER — Other Ambulatory Visit (HOSPITAL_BASED_OUTPATIENT_CLINIC_OR_DEPARTMENT_OTHER): Payer: Medicare Other | Admitting: Lab

## 2012-07-31 ENCOUNTER — Ambulatory Visit: Payer: Medicare Other | Admitting: Nutrition

## 2012-07-31 ENCOUNTER — Ambulatory Visit (HOSPITAL_BASED_OUTPATIENT_CLINIC_OR_DEPARTMENT_OTHER): Payer: Medicare Other

## 2012-07-31 DIAGNOSIS — T451X5A Adverse effect of antineoplastic and immunosuppressive drugs, initial encounter: Secondary | ICD-10-CM

## 2012-07-31 DIAGNOSIS — Z5111 Encounter for antineoplastic chemotherapy: Secondary | ICD-10-CM

## 2012-07-31 DIAGNOSIS — C3410 Malignant neoplasm of upper lobe, unspecified bronchus or lung: Secondary | ICD-10-CM

## 2012-07-31 LAB — COMPREHENSIVE METABOLIC PANEL (CC13)
ALT: 12 U/L (ref 0–55)
AST: 18 U/L (ref 5–34)
Alkaline Phosphatase: 87 U/L (ref 40–150)
Sodium: 132 mEq/L — ABNORMAL LOW (ref 136–145)
Total Bilirubin: 0.27 mg/dL (ref 0.20–1.20)
Total Protein: 7.2 g/dL (ref 6.4–8.3)

## 2012-07-31 LAB — CBC WITH DIFFERENTIAL/PLATELET
BASO%: 0 % (ref 0.0–2.0)
LYMPH%: 17.7 % (ref 14.0–49.7)
MCHC: 32.6 g/dL (ref 31.5–36.0)
MCV: 81.6 fL (ref 79.5–101.0)
MONO%: 20.2 % — ABNORMAL HIGH (ref 0.0–14.0)
Platelets: 317 10*3/uL (ref 145–400)
RBC: 3.16 10*6/uL — ABNORMAL LOW (ref 3.70–5.45)
WBC: 3.3 10*3/uL — ABNORMAL LOW (ref 3.9–10.3)

## 2012-07-31 MED ORDER — SODIUM CHLORIDE 0.9 % IV SOLN
Freq: Once | INTRAVENOUS | Status: AC
  Administered 2012-07-31: 13:00:00 via INTRAVENOUS

## 2012-07-31 MED ORDER — DEXAMETHASONE SODIUM PHOSPHATE 20 MG/5ML IJ SOLN
20.0000 mg | Freq: Once | INTRAMUSCULAR | Status: AC
  Administered 2012-07-31: 20 mg via INTRAVENOUS

## 2012-07-31 MED ORDER — SODIUM CHLORIDE 0.9 % IV SOLN
375.0000 mg/m2 | Freq: Once | INTRAVENOUS | Status: AC
  Administered 2012-07-31: 550 mg via INTRAVENOUS
  Filled 2012-07-31: qty 22

## 2012-07-31 MED ORDER — ONDANSETRON 16 MG/50ML IVPB (CHCC)
16.0000 mg | Freq: Once | INTRAVENOUS | Status: AC
  Administered 2012-07-31: 16 mg via INTRAVENOUS

## 2012-07-31 MED ORDER — SODIUM CHLORIDE 0.9 % IV SOLN
225.6000 mg | Freq: Once | INTRAVENOUS | Status: AC
  Administered 2012-07-31: 230 mg via INTRAVENOUS
  Filled 2012-07-31: qty 23

## 2012-07-31 NOTE — Progress Notes (Signed)
[facility] [phone]   [phone]  OFFICE PROGRESS NOTE  SHAW,KIMBERLEE, MD [address] [place] [address]  DIAGNOSIS: Metastatic non-small cell lung cancer, adenocarcinoma, negative EGFR mutation, negative ALK gene translocation, presented with right upper lobe lung mass as well as mediastinal lymphadenopathy and left lower lobe lesion diagnosed in April of 2014.   PRIOR THERAPY: Palliative radiotherapy under the care of Dr. Mitzi Hansen to the right upper lobe lung mass and mediastinum expected to be completed on 07/05/2012   CURRENT THERAPY: Systemic chemotherapy with carboplatin for AUC of 5 and Alimta 500 mg/M2 on 07/12/2012, status post 1 cycle.  INTERVAL HISTORY: Sylvia [name] 79 y.o. female returns to the clinic today for followup visit accompanied her daughter. The patient has rough time with the first cycle of the chemotherapy was increasing fatigue and weakness but she denied having any significant nausea or vomiting, no fever or chills but she denied having any significant chest pain, shortness breath, cough or hemoptysis. She has no significant weight loss. She is here today to start cycle #2 of her chemotherapy.  MEDICAL HISTORY: Past Medical History  Diagnosis Date  . HTN (hypertension)   . Renal insufficiency   . IBS (irritable bowel syndrome)   . Anemia 06/2010  . Raynaud's syndrome   . PVD (peripheral vascular disease)   . Carotid stenosis   . Raynaud phenomenon since 1990's  . Hoarseness of voice   . PVD (peripheral vascular disease)     ALLERGIES:  has No Known Allergies.  MEDICATIONS:  Current Outpatient Prescriptions  Medication Sig Dispense Refill  . Ascorbic Acid (VITAMIN C PO) Take 1 tablet by mouth daily.      Marland Kitchen BENICAR HCT 40-12.5 MG per tablet Take 1 tablet by mouth daily.       Marland Kitchen dexamethasone (DECADRON) 4 MG tablet 4 Milligram by mouth twice a day the day before, day of and day after the  chemotherapy every 3 weeks  40 tablet  1  . felodipine (PLENDIL) 10 MG 24 hr tablet Take 10 mg by mouth daily.       . fish oil-omega-3 fatty acids 1000 MG capsule Take 1 g by mouth daily.      . folic acid (FOLVITE) 1 MG tablet Take 1 tablet (1 mg total) by mouth daily.  30 tablet  4  . hyaluronate sodium (RADIAPLEXRX) GEL Apply topically 2 (two) times daily.      . IRON PO Take 1 tablet by mouth daily.      . Multiple Vitamin (MULTIVITAMIN WITH MINERALS) TABS Take 1 tablet by mouth daily.      . Sennosides (SENOKOT PO) Take 1 tablet by mouth daily as needed.      Marland Kitchen VITAMIN D, CHOLECALCIFEROL, PO Take 1 tablet by mouth daily.       . Vitamin Mixture (VITAMIN E COMPLETE) CAPS Take 1 capsule by mouth daily.       Marland Kitchen FeFum-FePoly-FA-B Cmp-C-Biot (FOLIVANE-PLUS) CAPS Take 1 tablet by mouth daily.      . prochlorperazine (COMPAZINE) 10 MG tablet Take 1 tablet (10 mg total) by mouth every 6 (six) hours as needed.  60 tablet  0   No current facility-administered medications for this visit.    SURGICAL HISTORY:  Past Surgical History  Procedure Laterality Date  . Colonoscopy  02/2004 and 05/2011    last colonoscopy in April 2013 was negative by Dr. Laural Benes  . Hemorrhoid surgery    .  Breast surgery      lumpectomy- right  . Hemorrhoid surgery    . Artery biopsy  12/30/2011    Procedure: MINOR BIOPSY TEMPORAL ARTERY;  Surgeon: Currie Paris, MD;  Location: [facility];  Service: General;  Laterality: Right;  temoral artery biopsy -right side    REVIEW OF SYSTEMS:  A comprehensive review of systems was negative except for: Constitutional: positive for fatigue   PHYSICAL EXAMINATION: General appearance: alert, cooperative, fatigued and no distress Head: Normocephalic, without obvious abnormality, atraumatic Neck: no adenopathy Lymph nodes: Cervical, supraclavicular, and axillary nodes normal. Resp: clear to auscultation bilaterally Cardio: regular rate and rhythm, S1, S2  normal, no murmur, click, rub or gallop GI: soft, non-tender; bowel sounds normal; no masses,  no organomegaly Extremities: extremities normal, atraumatic, no cyanosis or edema Neurologic: Alert and oriented X 3, normal strength and tone. Normal symmetric reflexes. Normal coordination and gait  ECOG PERFORMANCE STATUS: 1 - Symptomatic but completely ambulatory  Blood pressure 131/40, pulse 69, temperature 97.8 F (36.6 C), temperature source Oral, resp. rate 20, height 5\' 3"  (1.6 m), weight 97 lb 9.6 oz (44.271 kg).  LABORATORY DATA: Lab Results  Component Value Date   WBC 3.3* 07/31/2012   HGB 8.4* 07/31/2012   HCT 25.8* 07/31/2012   MCV 81.6 07/31/2012   PLT 317 07/31/2012      Chemistry      Component Value Date/Time   NA 131* 07/24/2012 1113   NA 134* 06/07/2011 1007   K 4.3 07/24/2012 1113   K 4.5 06/07/2011 1007   CL 96* 07/24/2012 1113   CL 96 06/07/2011 1007   CO2 28 07/24/2012 1113   CO2 27 06/07/2011 1007   BUN 34.5* 07/24/2012 1113   BUN 32* 06/07/2011 1007   CREATININE 1.0 07/24/2012 1113   CREATININE 0.98 06/07/2011 1007      Component Value Date/Time   CALCIUM 8.9 07/24/2012 1113   CALCIUM 9.4 06/07/2011 1007   ALKPHOS 82 07/24/2012 1113   ALKPHOS 70 06/07/2011 1007   AST 16 07/24/2012 1113   AST 21 06/07/2011 1007   ALT 15 07/24/2012 1113   ALT 12 06/07/2011 1007   BILITOT 0.36 07/24/2012 1113   BILITOT 0.4 06/07/2011 1007       RADIOGRAPHIC STUDIES: No results found.  ASSESSMENT AND PLAN: This is a very pleasant 79 years old white female with metastatic non-small cell lung cancer, adenocarcinoma currently undergoing systemic chemotherapy was carboplatin and Alimta status post 1 cycle. The patient has increasing fatigue and weakness after her chemotherapy. I had a lengthy discussion with the patient and her daughter today about her current condition and treatment options. I gave the patient the option of discontinuing chemotherapy and consideration of palliative care versus proceeding  with the second cycle of her treatment as scheduled. After discussion of the 2 options in details, the patient would like to proceed with her chemotherapy as scheduled. I would reduce the dose of carboplatin to AUC of 4 and Alimta to 375 MG/M2 every 3 weeks.  The patient come back for followup visit in 3 weeks with the next cycle of her chemotherapy. For chemotherapy-induced anemia, I would consider the patient for PRBCs transfusion if her hemoglobin less than 8 g/dL  All questions were answered. The patient knows to call the clinic with any problems, questions or concerns. We can certainly see the patient much sooner if necessary.  I spent 15 minutes counseling the patient face to face. The total time  spent in the appointment was 25 minutes.

## 2012-07-31 NOTE — Telephone Encounter (Signed)
Per staff message and POF I have scheduled appts.  JMW  

## 2012-07-31 NOTE — Progress Notes (Signed)
I spent a few minutes with patient and daughter today in chemotherapy room. Patient is an 79 year old female diagnosed with non-small cell lung cancer receiving chemotherapy. Past medical history includes hypertension, renal insufficiency, IBS, anemia, PVD, and Raynaud's syndrome.  Medications include vitamin C, Decadron, omega-3 fatty acids, Folvite, multivitamin, Compazine, vitamin D, and vitamin E.  Labs include sodium 131, glucose 106, BUN 34.5, albumin 2.8 on June 9.   Height: 63 inches. Weight: 97.6 pounds June 16. Usual body weight: 105 pounds. BMI: 17.29.  I followed up with patient briefly today in the infusion room. I have contacted patient by telephone in the past at navigator's request Patient says she knows how to eat and what to eat and has lots of written information. Her weight has been stable for the past week. In speaking with patient today, patient reiterates that she is trying to eat and she knows what to eat. She follows a very healthy diet.  Nutrition diagnosis: Unintended weight loss related to diagnosis of non-small cell lung cancer as evidenced by BMI of 17.29 and 7% weight loss from usual body weight.  Intervention: Patient and daughter were educated on the importance of higher protein, higher calorie foods to minimize further weight loss. Patient was educated to contact me if she had questions or concerns. My contact information was provided and patient expresses appreciation.  Monitoring, evaluation, goals: Patient will tolerate adequate calories and protein to minimize further weight loss.  Next visit: Patient will contact me for questions or concerns. I will briefly followup with patient on Monday, July 28, in the infusion room.

## 2012-07-31 NOTE — Patient Instructions (Signed)
Continue chemotherapy today as scheduled. Follow up visit in 3 weeks 

## 2012-07-31 NOTE — Telephone Encounter (Signed)
gv and printed appt sched and avs for pt....MW added tx   °

## 2012-07-31 NOTE — Patient Instructions (Signed)
[facility] Discharge Instructions for Patients Receiving Chemotherapy  Today you received the following chemotherapy agents Alimta/Carboplatin To help prevent nausea and vomiting after your treatment, we encourage you to take your nausea medication as prescribed.  If you develop nausea and vomiting that is not controlled by your nausea medication, call the clinic.   BELOW ARE SYMPTOMS THAT SHOULD BE REPORTED IMMEDIATELY:  *FEVER GREATER THAN 100.5 F  *CHILLS WITH OR WITHOUT FEVER  NAUSEA AND VOMITING THAT IS NOT CONTROLLED WITH YOUR NAUSEA MEDICATION  *UNUSUAL SHORTNESS OF BREATH  *UNUSUAL BRUISING OR BLEEDING  TENDERNESS IN MOUTH AND THROAT WITH OR WITHOUT PRESENCE OF ULCERS  *URINARY PROBLEMS  *BOWEL PROBLEMS  UNUSUAL RASH Items with * indicate a potential emergency and should be followed up as soon as possible.  Feel free to call the clinic you have any questions or concerns. The clinic phone number is [phone].    

## 2012-08-02 ENCOUNTER — Encounter: Payer: Self-pay | Admitting: Oncology

## 2012-08-03 ENCOUNTER — Ambulatory Visit
Admission: RE | Admit: 2012-08-03 | Discharge: 2012-08-03 | Disposition: A | Discharge status: Home / Self Care | Payer: Medicare Other | Source: Ambulatory Visit | Attending: Radiation Oncology | Admitting: Radiation Oncology

## 2012-08-03 NOTE — Progress Notes (Signed)
Sylvia [name] here with her daughter for follow up after treatment to her right chest.  She denies pain.  She is fatigued.  Her last chemotherapy treatment was on Monday.  She states she does not have an appetite and her taste buds have changed.  She denies shortness of breath.  She does have a cough and is bringing up white sputum.  She states she feels queezy.  Her skin is intact.  She is using radiaplex.

## 2012-08-03 NOTE — Progress Notes (Signed)
Radiation Oncology         [id number]) [phone] ________________________________  Name: Sylvia [name] MRN: [id number]  Date: 08/03/2012  DOB: October 03, 1926  Follow-Up Visit Note  CC: Lupita Raider, MD  Delight Ovens, MD  Diagnosis:   Lung cancer  Interval Since Last Radiation:  One month   Narrative:  The patient returns today for routine follow-up.  The patient states that she is doing quite well. She is proceeding with chemotherapy and is experiencing some significant tiredness related to this. Otherwise she feels that she is done fairly well with her ongoing treatment. She denies any worsening shortness of breath. No esophagitis/heartburn.                              ALLERGIES:  has No Known Allergies.  Meds: Current Outpatient Prescriptions  Medication Sig Dispense Refill  . Ascorbic Acid (VITAMIN C PO) Take 1 tablet by mouth daily.      Marland Kitchen BENICAR HCT 40-12.5 MG per tablet Take 1 tablet by mouth daily.       Marland Kitchen dexamethasone (DECADRON) 4 MG tablet 4 Milligram by mouth twice a day the day before, day of and day after the chemotherapy every 3 weeks  40 tablet  1  . FeFum-FePoly-FA-B Cmp-C-Biot (FOLIVANE-PLUS) CAPS Take 1 tablet by mouth daily.      . felodipine (PLENDIL) 10 MG 24 hr tablet Take 10 mg by mouth daily.       . fish oil-omega-3 fatty acids 1000 MG capsule Take 1 g by mouth daily.      . folic acid (FOLVITE) 1 MG tablet Take 1 tablet (1 mg total) by mouth daily.  30 tablet  4  . hyaluronate sodium (RADIAPLEXRX) GEL Apply topically 2 (two) times daily.      . IRON PO Take 1 tablet by mouth daily.      . Multiple Vitamin (MULTIVITAMIN WITH MINERALS) TABS Take 1 tablet by mouth daily.      . prochlorperazine (COMPAZINE) 10 MG tablet Take 1 tablet (10 mg total) by mouth every 6 (six) hours as needed.  60 tablet  0  . Sennosides (SENOKOT PO) Take 1 tablet by mouth daily as needed.      Marland Kitchen VITAMIN D, CHOLECALCIFEROL, PO Take 1 tablet by mouth daily.       . Vitamin Mixture  (VITAMIN E COMPLETE) CAPS Take 1 capsule by mouth daily.        No current facility-administered medications for this encounter.    Physical Findings: The patient is in no acute distress. Patient is alert and oriented.  height is 5\' 3"  (1.6 m) and weight is 99 lb 8 oz (45.133 kg). Her temperature is 98.5 F (36.9 C). Her blood pressure is 116/41 and her pulse is 69. Her oxygen saturation is 97%. .   General: Well-developed, in no acute distress HEENT: Normocephalic, atraumatic Cardiovascular: Regular rate and rhythm Respiratory: Clear to auscultation bilaterally GI: Soft, nontender, normal bowel sounds Extremities: No edema present   Lab Findings: Lab Results  Component Value Date   WBC 3.3* 07/31/2012   HGB 8.4* 07/31/2012   HCT 25.8* 07/31/2012   MCV 81.6 07/31/2012   PLT 317 07/31/2012     Radiographic Findings: No results found.  Impression:    The patient is doing satisfactorily one month after completing her course of thoracic radiotherapy. She is continuing with chemotherapy through Dr. Arbutus Ped in medical oncology.  Plan:  Followup in 6 months.   Radene Gunning, M.D., Ph.D.

## 2012-08-07 ENCOUNTER — Other Ambulatory Visit (HOSPITAL_BASED_OUTPATIENT_CLINIC_OR_DEPARTMENT_OTHER): Payer: Commercial Indemnity

## 2012-08-07 ENCOUNTER — Telehealth: Payer: Self-pay | Admitting: *Deleted

## 2012-08-07 ENCOUNTER — Other Ambulatory Visit: Payer: Self-pay | Admitting: Medical Oncology

## 2012-08-07 ENCOUNTER — Telehealth: Payer: Self-pay | Admitting: Internal Medicine

## 2012-08-07 ENCOUNTER — Other Ambulatory Visit: Payer: Self-pay | Admitting: *Deleted

## 2012-08-07 ENCOUNTER — Ambulatory Visit (HOSPITAL_BASED_OUTPATIENT_CLINIC_OR_DEPARTMENT_OTHER): Payer: Commercial Indemnity

## 2012-08-07 ENCOUNTER — Ambulatory Visit: Payer: Medicare Other

## 2012-08-07 DIAGNOSIS — C3410 Malignant neoplasm of upper lobe, unspecified bronchus or lung: Secondary | ICD-10-CM

## 2012-08-07 DIAGNOSIS — Z5189 Encounter for other specified aftercare: Secondary | ICD-10-CM

## 2012-08-07 DIAGNOSIS — D709 Neutropenia, unspecified: Secondary | ICD-10-CM

## 2012-08-07 LAB — COMPREHENSIVE METABOLIC PANEL (CC13)
ALT: 15 U/L (ref 0–55)
BUN: 30.6 mg/dL — ABNORMAL HIGH (ref 7.0–26.0)
CO2: 24 mEq/L (ref 22–29)
Calcium: 9.1 mg/dL (ref 8.4–10.4)
Chloride: 98 mEq/L (ref 98–107)
Creatinine: 1 mg/dL (ref 0.6–1.1)
Glucose: 105 mg/dl — ABNORMAL HIGH (ref 70–99)

## 2012-08-07 LAB — CBC WITH DIFFERENTIAL/PLATELET
BASO%: 1.4 % (ref 0.0–2.0)
Basophils Absolute: 0 10*3/uL (ref 0.0–0.1)
EOS%: 1.9 % (ref 0.0–7.0)
HCT: 26.7 % — ABNORMAL LOW (ref 34.8–46.6)
HGB: 9.1 g/dL — ABNORMAL LOW (ref 11.6–15.9)
MCH: 28 pg (ref 25.1–34.0)
MCHC: 34.1 g/dL (ref 31.5–36.0)
MONO#: 0.1 10*3/uL (ref 0.1–0.9)
NEUT%: 62.3 % (ref 38.4–76.8)
RDW: 16.5 % — ABNORMAL HIGH (ref 11.2–14.5)
WBC: 1 10*3/uL — ABNORMAL LOW (ref 3.9–10.3)
lymph#: 0.2 10*3/uL — ABNORMAL LOW (ref 0.9–3.3)

## 2012-08-07 MED ORDER — FILGRASTIM 300 MCG/0.5ML IJ SOLN
300.0000 ug | Freq: Once | INTRAMUSCULAR | Status: DC
Start: 2012-08-07 — End: 2012-08-07
  Administered 2012-08-07: 300 ug via SUBCUTANEOUS
  Filled 2012-08-07: qty 0.5

## 2012-08-07 NOTE — Patient Instructions (Addendum)
Filgrastim, G-CSF injection What is this medicine? FILGRASTIM, G-CSF (fil GRA stim) stimulates the formation of white blood cells. This medicine is given to patients with conditions that may cause a decrease in white blood cells, like those receiving certain types of chemotherapy or bone marrow transplant. It helps the bone marrow recover its ability to produce white blood cells. Increasing the amount of white blood cells helps to decrease the risk of infection and fever. This medicine may be used for other purposes; ask your health care provider or pharmacist if you have questions. What should I tell my health care provider before I take this medicine? They need to know if you have any of these conditions: -currently receiving radiation therapy -sickle cell disease -an unusual or allergic reaction to filgrastim, E. coli protein, other medicines, foods, dyes, or preservatives -pregnant or trying to get pregnant -breast-feeding How should I use this medicine? This medicine is for injection into a vein or injection under the skin. It is usually given by a health care professional in a hospital or clinic setting. If you get this medicine at home, you will be taught how to prepare and give this medicine. Always change the site for the injection under the skin. Let the solution warm to room temperature before you use it. Do not shake the solution before you withdraw a dose. Throw away any unused portion. Use exactly as directed. Take your medicine at regular intervals. Do not take your medicine more often than directed. It is important that you put your used needles and syringes in a special sharps container. Do not put them in a trash can. If you do not have a sharps container, call your pharmacist or healthcare provider to get one. Talk to your pediatrician regarding the use of this medicine in children. While this medicine may be prescribed for children for selected conditions, precautions do  apply. Overdosage: If you think you have taken too much of this medicine contact a poison control center or emergency room at once. NOTE: This medicine is only for you. Do not share this medicine with others. What if I miss a dose? Try not to miss doses. If you miss a dose take the dose as soon as you remember. If it is almost time for the next dose, do not take double doses unless told to by your doctor or health care professional. What may interact with this medicine? -lithium -medicines for cancer chemotherapy This list may not describe all possible interactions. Give your health care provider a list of all the medicines, herbs, non-prescription drugs, or dietary supplements you use. Also tell them if you smoke, drink alcohol, or use illegal drugs. Some items may interact with your medicine. What should I watch for while using this medicine? Visit your doctor or health care professional for regular checks on your progress. If you get a fever or any sign of infection while you are using this medicine, do not treat yourself. Check with your doctor or health care professional. Bone pain can usually be relieved by mild pain relievers such as acetaminophen or ibuprofen. Check with your doctor or health care professional before taking these medicines as they may hide a fever. Call your doctor or health care professional if the aches and pains are severe or do not go away. What side effects may I notice from receiving this medicine? Side effects that you should report to your doctor or health care professional as soon as possible: -allergic reactions like skin rash, itching   or hives, swelling of the face, lips, or tongue -difficulty breathing, wheezing -fever -pain, redness, or swelling at the injection site -stomach or side pain, or pain at the shoulder Side effects that usually do not require medical attention (report to your doctor or health care professional if they continue or are  bothersome): -bone pain (ribs, lower back, breast bone) -headache -skin rash This list may not describe all possible side effects. Call your doctor for medical advice about side effects. You may report side effects to FDA at 1-800-FDA-1088. Where should I keep my medicine? Keep out of the reach of children. Store in a refrigerator between 2 and 8 degrees C (36 and 46 degrees F). Do not freeze or leave in direct sunlight. If vials or syringes are left out of the refrigerator for more than 24 hours, they must be thrown away. Throw away unused vials after the expiration date on the carton. NOTE: This sheet is a summary. It may not cover all possible information. If you have questions about this medicine, talk to your doctor, pharmacist, or health care provider.  2013, [employer]. (04/19/2007 1:33:21 PM)  

## 2012-08-07 NOTE — Telephone Encounter (Signed)
Spoke with pt earlier today.  She stated she was not feeling well, very tired.  She had received lab work.  Dr. Arbutus Ped reviewed and orders received.  I called pt back to notify and explain orders.  She verbalized understanding and coming to cancer center to receive shot

## 2012-08-07 NOTE — Telephone Encounter (Signed)
s.w. pt and advised on 6.23 and 6.24.14 inj appt....pt ok and aware

## 2012-08-08 ENCOUNTER — Ambulatory Visit (HOSPITAL_BASED_OUTPATIENT_CLINIC_OR_DEPARTMENT_OTHER): Payer: Medicare Other

## 2012-08-08 ENCOUNTER — Other Ambulatory Visit: Payer: Self-pay | Admitting: Medical Oncology

## 2012-08-08 VITALS — BP 161/35 | HR 63 | Temp 97.8°F

## 2012-08-08 DIAGNOSIS — D709 Neutropenia, unspecified: Secondary | ICD-10-CM

## 2012-08-08 DIAGNOSIS — Z5189 Encounter for other specified aftercare: Secondary | ICD-10-CM

## 2012-08-08 DIAGNOSIS — C3410 Malignant neoplasm of upper lobe, unspecified bronchus or lung: Secondary | ICD-10-CM

## 2012-08-08 MED ORDER — FILGRASTIM 300 MCG/0.5ML IJ SOLN
300.0000 ug | Freq: Once | INTRAMUSCULAR | Status: AC
  Administered 2012-08-08: 300 ug via SUBCUTANEOUS
  Filled 2012-08-08: qty 0.5

## 2012-08-09 ENCOUNTER — Ambulatory Visit (HOSPITAL_BASED_OUTPATIENT_CLINIC_OR_DEPARTMENT_OTHER): Payer: Medicare Other

## 2012-08-09 ENCOUNTER — Other Ambulatory Visit: Payer: Self-pay | Admitting: Medical Oncology

## 2012-08-09 ENCOUNTER — Telehealth: Payer: Self-pay | Admitting: *Deleted

## 2012-08-09 DIAGNOSIS — C3410 Malignant neoplasm of upper lobe, unspecified bronchus or lung: Secondary | ICD-10-CM

## 2012-08-09 DIAGNOSIS — D709 Neutropenia, unspecified: Secondary | ICD-10-CM

## 2012-08-09 DIAGNOSIS — Z5189 Encounter for other specified aftercare: Secondary | ICD-10-CM

## 2012-08-09 MED ORDER — FILGRASTIM 300 MCG/0.5ML IJ SOLN
300.0000 ug | Freq: Once | INTRAMUSCULAR | Status: DC
  Administered 2012-08-09: 300 ug via SUBCUTANEOUS
  Filled 2012-08-09: qty 0.5

## 2012-08-09 MED ORDER — FILGRASTIM 300 MCG/0.5ML IJ SOLN: 300.0000 ug | Freq: Once | INTRAMUSCULAR | Status: DC

## 2012-08-09 NOTE — Telephone Encounter (Signed)
Pt called and stated she did not feel well.  She is c/o nausea. I asked if she had her bowels to move today and she stated yes.  She stated she will take her nausea medication.  I encourage her to take and call if still not feeling well

## 2012-08-14 ENCOUNTER — Other Ambulatory Visit: Payer: Medicare Other

## 2012-08-14 ENCOUNTER — Other Ambulatory Visit (HOSPITAL_BASED_OUTPATIENT_CLINIC_OR_DEPARTMENT_OTHER): Payer: Medicare Other

## 2012-08-14 DIAGNOSIS — C3490 Malignant neoplasm of unspecified part of unspecified bronchus or lung: Secondary | ICD-10-CM

## 2012-08-14 LAB — COMPREHENSIVE METABOLIC PANEL (CC13)
ALT: 10 U/L (ref 0–55)
Albumin: 3.1 g/dL — ABNORMAL LOW (ref 3.5–5.0)
CO2: 27 mEq/L (ref 22–29)
Calcium: 8.8 mg/dL (ref 8.4–10.4)
Chloride: 98 mEq/L (ref 98–109)
Sodium: 132 mEq/L — ABNORMAL LOW (ref 136–145)
Total Protein: 6.9 g/dL (ref 6.4–8.3)

## 2012-08-14 LAB — CBC WITH DIFFERENTIAL/PLATELET
BASO%: 0.8 % (ref 0.0–2.0)
HCT: 24.2 % — ABNORMAL LOW (ref 34.8–46.6)
MCHC: 34.1 g/dL (ref 31.5–36.0)
MONO#: 1.5 10*3/uL — ABNORMAL HIGH (ref 0.1–0.9)
NEUT%: 67.1 % (ref 38.4–76.8)
RBC: 2.93 10*6/uL — ABNORMAL LOW (ref 3.70–5.45)
WBC: 6 10*3/uL (ref 3.9–10.3)
lymph#: 0.4 10*3/uL — ABNORMAL LOW (ref 0.9–3.3)

## 2012-08-21 ENCOUNTER — Other Ambulatory Visit: Payer: Medicare Other

## 2012-08-21 ENCOUNTER — Other Ambulatory Visit (HOSPITAL_BASED_OUTPATIENT_CLINIC_OR_DEPARTMENT_OTHER): Payer: Medicare Other | Admitting: Lab

## 2012-08-21 ENCOUNTER — Ambulatory Visit (HOSPITAL_BASED_OUTPATIENT_CLINIC_OR_DEPARTMENT_OTHER): Payer: Medicare Other | Admitting: Internal Medicine

## 2012-08-21 ENCOUNTER — Encounter: Payer: Self-pay | Admitting: Internal Medicine

## 2012-08-21 ENCOUNTER — Ambulatory Visit (HOSPITAL_BASED_OUTPATIENT_CLINIC_OR_DEPARTMENT_OTHER): Payer: Medicare Other

## 2012-08-21 DIAGNOSIS — C3410 Malignant neoplasm of upper lobe, unspecified bronchus or lung: Secondary | ICD-10-CM

## 2012-08-21 DIAGNOSIS — Z5111 Encounter for antineoplastic chemotherapy: Secondary | ICD-10-CM

## 2012-08-21 DIAGNOSIS — C771 Secondary and unspecified malignant neoplasm of intrathoracic lymph nodes: Secondary | ICD-10-CM

## 2012-08-21 DIAGNOSIS — T451X5A Adverse effect of antineoplastic and immunosuppressive drugs, initial encounter: Secondary | ICD-10-CM

## 2012-08-21 LAB — CBC WITH DIFFERENTIAL/PLATELET
BASO%: 0.3 % (ref 0.0–2.0)
EOS%: 0.6 % (ref 0.0–7.0)
HGB: 8.1 g/dL — ABNORMAL LOW (ref 11.6–15.9)
MCH: 27 pg (ref 25.1–34.0)
MCHC: 32.7 g/dL (ref 31.5–36.0)
RDW: 18.9 % — ABNORMAL HIGH (ref 11.2–14.5)
lymph#: 0.7 10*3/uL — ABNORMAL LOW (ref 0.9–3.3)

## 2012-08-21 LAB — COMPREHENSIVE METABOLIC PANEL (CC13)
ALT: 13 U/L (ref 0–55)
AST: 25 U/L (ref 5–34)
Albumin: 3.4 g/dL — ABNORMAL LOW (ref 3.5–5.0)
Alkaline Phosphatase: 69 U/L (ref 40–150)
Potassium: 4.9 mEq/L (ref 3.5–5.1)
Sodium: 130 mEq/L — ABNORMAL LOW (ref 136–145)
Total Protein: 7.4 g/dL (ref 6.4–8.3)

## 2012-08-21 MED ORDER — CARBOPLATIN CHEMO INJECTION 450 MG/45ML
239.6000 mg | Freq: Once | INTRAVENOUS | Status: AC
  Administered 2012-08-21: 240 mg via INTRAVENOUS
  Filled 2012-08-21: qty 24

## 2012-08-21 MED ORDER — ONDANSETRON 16 MG/50ML IVPB (CHCC)
16.0000 mg | Freq: Once | INTRAVENOUS | Status: AC
  Administered 2012-08-21: 16 mg via INTRAVENOUS

## 2012-08-21 MED ORDER — SODIUM CHLORIDE 0.9 % IV SOLN
Freq: Once | INTRAVENOUS | Status: AC
  Administered 2012-08-21: 12:00:00 via INTRAVENOUS

## 2012-08-21 MED ORDER — CYANOCOBALAMIN 1000 MCG/ML IJ SOLN
1000.0000 ug | Freq: Once | INTRAMUSCULAR | Status: AC
  Administered 2012-08-21: 1000 ug via INTRAMUSCULAR

## 2012-08-21 MED ORDER — DEXAMETHASONE SODIUM PHOSPHATE 20 MG/5ML IJ SOLN
20.0000 mg | Freq: Once | INTRAMUSCULAR | Status: AC
  Administered 2012-08-21: 20 mg via INTRAVENOUS

## 2012-08-21 MED ORDER — SODIUM CHLORIDE 0.9 % IV SOLN
375.0000 mg/m2 | Freq: Once | INTRAVENOUS | Status: AC
  Administered 2012-08-21: 550 mg via INTRAVENOUS
  Filled 2012-08-21: qty 22

## 2012-08-21 NOTE — Patient Instructions (Signed)
[facility] Discharge Instructions for Patients Receiving Chemotherapy  Today you received the following chemotherapy agents carboplatin, alimta  To help prevent nausea and vomiting after your treatment, we encourage you to take your nausea medication as needed   If you develop nausea and vomiting that is not controlled by your nausea medication, call the clinic.   BELOW ARE SYMPTOMS THAT SHOULD BE REPORTED IMMEDIATELY:  *FEVER GREATER THAN 100.5 F  *CHILLS WITH OR WITHOUT FEVER  NAUSEA AND VOMITING THAT IS NOT CONTROLLED WITH YOUR NAUSEA MEDICATION  *UNUSUAL SHORTNESS OF BREATH  *UNUSUAL BRUISING OR BLEEDING  TENDERNESS IN MOUTH AND THROAT WITH OR WITHOUT PRESENCE OF ULCERS  *URINARY PROBLEMS  *BOWEL PROBLEMS  UNUSUAL RASH Items with * indicate a potential emergency and should be followed up as soon as possible.  Feel free to call the clinic you have any questions or concerns. The clinic phone number is [phone].    

## 2012-08-21 NOTE — Patient Instructions (Addendum)
Continue chemotherapy today as scheduled.  Followup visit in 3 weeks with repeat CT scan of the chest, abdomen and pelvis. 

## 2012-08-21 NOTE — Progress Notes (Signed)
[facility] [phone]   [phone]  OFFICE PROGRESS NOTE  SHAW,KIMBERLEE, MD [address] [place] [address]  DIAGNOSIS: Metastatic non-small cell lung cancer, adenocarcinoma, negative EGFR mutation, negative ALK gene translocation, presented with right upper lobe lung mass as well as mediastinal lymphadenopathy and left lower lobe lesion diagnosed in April of 2014.   PRIOR THERAPY: Palliative radiotherapy under the care of Dr. Mitzi Hansen to the right upper lobe lung mass and mediastinum expected to be completed on 07/05/2012   CURRENT THERAPY: Systemic chemotherapy with carboplatin for AUC of 5 and Alimta 500 mg/M2 on 07/12/2012, status post 2 cycles.  INTERVAL HISTORY: Sylvia [name] 79 y.o. female returns to the clinic today for followup visit accompanied by her son. The patient continues to complain of increasing fatigue and weakness most likely secondary to chemotherapy-induced anemia. She denied having any significant nausea or vomiting. She has no fever or chills. She denied having any significant chest pain, shortness of breath, cough or hemoptysis. The patient is tolerating her treatment fairly well except for the fatigue. She is here to start cycle #3 of her systemic therapy.  MEDICAL HISTORY: Past Medical History  Diagnosis Date  . HTN (hypertension)   . Renal insufficiency   . IBS (irritable bowel syndrome)   . Anemia 06/2010  . Raynaud's syndrome   . PVD (peripheral vascular disease)   . Carotid stenosis   . Raynaud phenomenon since 1990's  . Hoarseness of voice   . PVD (peripheral vascular disease)   . History of radiation therapy 06/15/2012-07/05/2012    37.5 gray to right lung tumor    ALLERGIES:  has No Known Allergies.  MEDICATIONS:  Current Outpatient Prescriptions  Medication Sig Dispense Refill  . acetaminophen (TYLENOL) 325 MG tablet Take 650 mg by mouth 2 (two) times daily.      . Ascorbic Acid  (VITAMIN C PO) Take 1 tablet by mouth daily.      Marland Kitchen BENICAR HCT 40-12.5 MG per tablet Take 1 tablet by mouth daily.       Marland Kitchen dexamethasone (DECADRON) 4 MG tablet 4 Milligram by mouth twice a day the day before, day of and day after the chemotherapy every 3 weeks  40 tablet  1  . FeFum-FePoly-FA-B Cmp-C-Biot (FOLIVANE-PLUS) CAPS Take 1 tablet by mouth daily.      . felodipine (PLENDIL) 10 MG 24 hr tablet Take 10 mg by mouth daily.       . fish oil-omega-3 fatty acids 1000 MG capsule Take 1 g by mouth daily.      . folic acid (FOLVITE) 1 MG tablet Take 1 tablet (1 mg total) by mouth daily.  30 tablet  4  . hyaluronate sodium (RADIAPLEXRX) GEL Apply topically 2 (two) times daily.      . IRON PO Take 1 tablet by mouth daily.      . Multiple Vitamin (MULTIVITAMIN WITH MINERALS) TABS Take 1 tablet by mouth daily.      . prochlorperazine (COMPAZINE) 10 MG tablet Take 1 tablet (10 mg total) by mouth every 6 (six) hours as needed.  60 tablet  0  . Sennosides (SENOKOT PO) Take 1 tablet by mouth daily as needed.      Marland Kitchen VITAMIN D, CHOLECALCIFEROL, PO Take 1 tablet by mouth daily.       . Vitamin Mixture (VITAMIN E COMPLETE) CAPS Take 1 capsule by mouth daily.        No  current facility-administered medications for this visit.    SURGICAL HISTORY:  Past Surgical History  Procedure Laterality Date  . Colonoscopy  02/2004 and 05/2011    last colonoscopy in April 2013 was negative by Dr. Laural Benes  . Hemorrhoid surgery    . Breast surgery      lumpectomy- right  . Hemorrhoid surgery    . Artery biopsy  12/30/2011    Procedure: MINOR BIOPSY TEMPORAL ARTERY;  Surgeon: Currie Paris, MD;  Location: [facility];  Service: General;  Laterality: Right;  temoral artery biopsy -right side    REVIEW OF SYSTEMS:  A comprehensive review of systems was negative except for: Constitutional: positive for fatigue   PHYSICAL EXAMINATION: General appearance: alert, cooperative, fatigued and no  distress Head: Normocephalic, without obvious abnormality, atraumatic Neck: no adenopathy Lymph nodes: Cervical, supraclavicular, and axillary nodes normal. Resp: clear to auscultation bilaterally Cardio: regular rate and rhythm, S1, S2 normal, no murmur, click, rub or gallop GI: soft, non-tender; bowel sounds normal; no masses,  no organomegaly Extremities: extremities normal, atraumatic, no cyanosis or edema Neurologic: Alert and oriented X 3, normal strength and tone. Normal symmetric reflexes. Normal coordination and gait  ECOG PERFORMANCE STATUS: 1 - Symptomatic but completely ambulatory  Blood pressure 135/38, pulse 65, temperature 97.4 F (36.3 C), temperature source Oral, resp. rate 17, height 5\' 3"  (1.6 m), weight 100 lb 9.6 oz (45.632 kg), SpO2 100.00%.  LABORATORY DATA: Lab Results  Component Value Date   WBC 6.9 08/21/2012   HGB 8.1* 08/21/2012   HCT 24.8* 08/21/2012   MCV 82.7 08/21/2012   PLT 324 08/21/2012      Chemistry      Component Value Date/Time   NA 132* 08/14/2012 1358   NA 134* 06/07/2011 1007   K 4.4 08/14/2012 1358   K 4.5 06/07/2011 1007   CL 98 08/07/2012 1134   CL 96 06/07/2011 1007   CO2 27 08/14/2012 1358   CO2 27 06/07/2011 1007   BUN 30.0* 08/14/2012 1358   BUN 32* 06/07/2011 1007   CREATININE 0.9 08/14/2012 1358   CREATININE 0.98 06/07/2011 1007      Component Value Date/Time   CALCIUM 8.8 08/14/2012 1358   CALCIUM 9.4 06/07/2011 1007   ALKPHOS 78 08/14/2012 1358   ALKPHOS 70 06/07/2011 1007   AST 18 08/14/2012 1358   AST 21 06/07/2011 1007   ALT 10 08/14/2012 1358   ALT 12 06/07/2011 1007   BILITOT <0.20 Repeated and Verified 08/14/2012 1358   BILITOT 0.4 06/07/2011 1007       RADIOGRAPHIC STUDIES: No results found.  ASSESSMENT AND PLAN: This is a very pleasant 79 years old white female with metastatic non-small cell lung cancer, adenocarcinoma currently undergoing systemic chemotherapy with carboplatin and Alimta status post 2 cycles. She is tolerating  her treatment fairly well except for the chemotherapy-induced anemia. I have a lengthy discussion with the patient and her son today about her condition. I gave her the option of repeating scan after cycle #2 versus proceeding with cycle #3 and then ordering the restaging scan. The patient would like to proceed with her chemotherapy today as scheduled. I would see her back for followup visit in 3 weeks with repeat CT scan of the chest, abdomen and pelvis for restaging of her disease. I would consider the patient for PRBCs transfusion of her hemoglobin is less than 8.0 g/dL or if she becomes more symptomatic. She was advised to call immediately if she has  any concerning symptoms in the interval.  All questions were answered. The patient knows to call the clinic with any problems, questions or concerns. We can certainly see the patient much sooner if necessary.  I spent 15 minutes counseling the patient face to face. The total time spent in the appointment was 25 minutes.

## 2012-08-22 ENCOUNTER — Telehealth: Payer: Self-pay | Admitting: *Deleted

## 2012-08-22 ENCOUNTER — Telehealth: Payer: Self-pay | Admitting: Internal Medicine

## 2012-08-22 NOTE — Telephone Encounter (Signed)
s.w. pt and advised on next lab...Marland Kitchenpt aware to pick up new sched.Marland KitchenMarland Kitchen

## 2012-08-22 NOTE — Telephone Encounter (Signed)
Per staff message and POF I have scheduled appts.  JMW  

## 2012-08-23 ENCOUNTER — Telehealth: Payer: Self-pay | Admitting: Internal Medicine

## 2012-08-23 ENCOUNTER — Telehealth: Payer: Self-pay | Admitting: *Deleted

## 2012-08-23 NOTE — Telephone Encounter (Signed)
Pt called regarding schedule.  She was confused about time and dates of appt.  I clarified appt's

## 2012-08-23 NOTE — Telephone Encounter (Signed)
pt came in and got updated appt sched

## 2012-08-28 ENCOUNTER — Encounter (HOSPITAL_COMMUNITY)
Admission: RE | Admit: 2012-08-28 | Discharge: 2012-08-28 | Disposition: A | Discharge status: Home / Self Care | Payer: Medicare Other | Source: Ambulatory Visit | Attending: Internal Medicine | Admitting: Internal Medicine

## 2012-08-28 ENCOUNTER — Other Ambulatory Visit (HOSPITAL_BASED_OUTPATIENT_CLINIC_OR_DEPARTMENT_OTHER): Payer: Medicare Other

## 2012-08-28 ENCOUNTER — Telehealth: Payer: Self-pay | Admitting: *Deleted

## 2012-08-28 ENCOUNTER — Other Ambulatory Visit: Payer: Self-pay | Admitting: *Deleted

## 2012-08-28 ENCOUNTER — Encounter (HOSPITAL_COMMUNITY): Payer: Self-pay | Admitting: *Deleted

## 2012-08-28 ENCOUNTER — Other Ambulatory Visit: Payer: Self-pay

## 2012-08-28 ENCOUNTER — Observation Stay (HOSPITAL_COMMUNITY)
Admission: EM | Admit: 2012-08-28 | Discharge: 2012-08-29 | Disposition: A | Discharge status: Home / Self Care | Payer: Medicare Other | Attending: Emergency Medicine | Admitting: Emergency Medicine

## 2012-08-28 DIAGNOSIS — I10 Essential (primary) hypertension: Secondary | ICD-10-CM | POA: Insufficient documentation

## 2012-08-28 DIAGNOSIS — D649 Anemia, unspecified: Secondary | ICD-10-CM | POA: Insufficient documentation

## 2012-08-28 DIAGNOSIS — R5383 Other fatigue: Secondary | ICD-10-CM | POA: Insufficient documentation

## 2012-08-28 DIAGNOSIS — C3490 Malignant neoplasm of unspecified part of unspecified bronchus or lung: Secondary | ICD-10-CM

## 2012-08-28 DIAGNOSIS — R5381 Other malaise: Secondary | ICD-10-CM | POA: Insufficient documentation

## 2012-08-28 DIAGNOSIS — I7300 Raynaud's syndrome without gangrene: Secondary | ICD-10-CM | POA: Insufficient documentation

## 2012-08-28 DIAGNOSIS — Z79899 Other long term (current) drug therapy: Secondary | ICD-10-CM | POA: Insufficient documentation

## 2012-08-28 DIAGNOSIS — Z923 Personal history of irradiation: Secondary | ICD-10-CM | POA: Insufficient documentation

## 2012-08-28 DIAGNOSIS — I739 Peripheral vascular disease, unspecified: Secondary | ICD-10-CM | POA: Insufficient documentation

## 2012-08-28 LAB — CBC WITH DIFFERENTIAL/PLATELET
BASO%: 0.6 % (ref 0.0–2.0)
HGB: 7.6 g/dL — ABNORMAL LOW (ref 11.6–15.9)
LYMPH%: 10.3 % — ABNORMAL LOW (ref 14.0–49.7)
MCHC: 33.9 g/dL (ref 31.5–36.0)
MONO#: 0.2 10*3/uL (ref 0.1–0.9)
MONO%: 9.1 % (ref 0.0–14.0)
NEUT#: 1.3 10*3/uL — ABNORMAL LOW (ref 1.5–6.5)
NEUT%: 78.4 % — ABNORMAL HIGH (ref 38.4–76.8)

## 2012-08-28 LAB — COMPREHENSIVE METABOLIC PANEL (CC13)
Alkaline Phosphatase: 64 U/L (ref 40–150)
CO2: 24 mEq/L (ref 22–29)
Creatinine: 1 mg/dL (ref 0.6–1.1)
Glucose: 129 mg/dl (ref 70–140)
Total Bilirubin: 0.43 mg/dL (ref 0.20–1.20)

## 2012-08-28 NOTE — ED Notes (Signed)
Upon talking to patient, patient states that blood was drawn this afternoon at cancer center. Talked with Triage nurse about this subject and states to wait to draw until patient is seen by physician

## 2012-08-28 NOTE — Progress Notes (Signed)
hbg 7.6, per Dr Donnald Garre pt needs 2 units PRBC's. Pt is also neutropenic.  Neutropenic precautions reviewed with pt.  Pt is aware of appt date and time

## 2012-08-28 NOTE — Telephone Encounter (Signed)
Patient says she will not participate in research.  Returned paperwork blank of the documentation for consent to participate in research project IRB project number [id number].

## 2012-08-28 NOTE — Telephone Encounter (Signed)
Walk in form completed after today's lab appointment c/o stomach and headache.  Patient reports she feels like "_ _ _ _".  Would like to know if this is normal.  Does not want any new medicines but headaches have gotten worse.  Denies history of migraines.  Takes tylenol twice a day with some relief.  Compazine ordered for use if nausea.  Stomach aches and she wretches every morning.  Food doesn't taste good so she doesn't eat as much.  Bowels moved today and daily.  Reports use of laxative but denies diarrhea.   Checked lab results which hgb, wbc and neutrophils are low.  She is wearing a blue blood band.  Collaborative nurse notified and will notify provider.  This nurse reviewed neutropenia precautions.

## 2012-08-28 NOTE — ED Notes (Signed)
Pt has appt tomorrow for blood transfusion; states today started feeling worse; dizziness and weak; wanted to be checked

## 2012-08-28 NOTE — ED Provider Notes (Signed)
History    CSN: [id number] Arrival date & time 08/28/12  2157  First MD Initiated Contact with Patient 08/28/12 2256     Chief Complaint  Patient presents with  . Weakness   (Consider location/radiation/quality/duration/timing/severity/associated sxs/prior Treatment) HPI Comments: PAtient is scheduled for transfusion tomorrow to to chronic, recurrent anemia, status post chemotherapy for lung cancer.  Tonight, after watering her garden and heat.  She became more symptomatic came to the emergency department for transfusion tonight.  There are no new symptoms.  She has not bradycardic or tachycardic short of breath.  Not having chest pain.  She is not having any dysuria, no cough.  Patient is a 79 y.o. female presenting with weakness. The history is provided by the patient.  Weakness This is a recurrent problem. The current episode started 1 to 4 weeks ago. Associated symptoms include weakness. Pertinent negatives include no abdominal pain, anorexia, chest pain, congestion, coughing, fever, myalgias, nausea, neck pain, numbness, rash, sore throat or urinary symptoms. The symptoms are aggravated by exertion. She has tried nothing for the symptoms. The treatment provided no relief.   Past Medical History  Diagnosis Date  . HTN (hypertension)   . Renal insufficiency   . IBS (irritable bowel syndrome)   . Anemia 06/2010  . Raynaud's syndrome   . PVD (peripheral vascular disease)   . Carotid stenosis   . Raynaud phenomenon since 1990's  . Hoarseness of voice   . PVD (peripheral vascular disease)   . History of radiation therapy 06/15/2012-07/05/2012    37.5 gray to right lung tumor   Past Surgical History  Procedure Laterality Date  . Colonoscopy  02/2004 and 05/2011    last colonoscopy in April 2013 was negative by Dr. Laural Benes  . Hemorrhoid surgery    . Breast surgery      lumpectomy- right  . Hemorrhoid surgery    . Artery biopsy  12/30/2011    Procedure: MINOR BIOPSY TEMPORAL ARTERY;   Surgeon: Currie Paris, MD;  Location: [facility];  Service: General;  Laterality: Right;  temoral artery biopsy -right side   Family History  Problem Relation Age of Onset  . Pneumonia Mother   . Stroke Father    History  Substance Use Topics  . Smoking status: Former Smoker -- 0.25 packs/day for 60 years  . Smokeless tobacco: Not on file  . Alcohol Use: No   OB History   Grav Para Term Preterm Abortions TAB SAB Ect Mult Living                 Review of Systems  Constitutional: Negative for fever.  HENT: Negative for congestion, sore throat and neck pain.   Respiratory: Negative for cough.   Cardiovascular: Negative for chest pain.  Gastrointestinal: Negative for nausea, abdominal pain and anorexia.  Genitourinary: Negative for dysuria.  Musculoskeletal: Negative for myalgias and gait problem.  Skin: Negative for rash.  Neurological: Positive for dizziness and weakness. Negative for numbness.  All other systems reviewed and are negative.    Allergies  Review of patient's allergies indicates no known allergies.  Home Medications   Current Outpatient Rx  Name  Route  Sig  Dispense  Refill  . acetaminophen (TYLENOL) 325 MG tablet   Oral   Take 650 mg by mouth 2 (two) times daily.         . Ascorbic Acid (VITAMIN C PO)   Oral   Take 1 tablet by mouth daily.         Marland Kitchen  BENICAR HCT 40-12.5 MG per tablet   Oral   Take 1 tablet by mouth daily.          Marland Kitchen dexamethasone (DECADRON) 4 MG tablet      4 Milligram by mouth twice a day the day before, day of and day after the chemotherapy every 3 weeks   40 tablet   1   . felodipine (PLENDIL) 10 MG 24 hr tablet   Oral   Take 10 mg by mouth daily.          Marland Kitchen FERROUS SULFATE PO   Oral   Take 1 tablet by mouth daily.         . fish oil-omega-3 fatty acids 1000 MG capsule   Oral   Take 1 g by mouth daily.         . folic acid (FOLVITE) 1 MG tablet   Oral   Take 1 tablet (1 mg total)  by mouth daily.   30 tablet   4   . Multiple Vitamin (MULTIVITAMIN WITH MINERALS) TABS   Oral   Take 1 tablet by mouth daily.         Marland Kitchen PRESCRIPTION MEDICATION   Intravenous   Inject into the vein once. PEMEtrexed (ALIMTA) 550 mg in sodium chloride 0.9 % 100 mL chemo infusion 375 mg/m2  1.45 m2 (Treatment Plan Actual)  Once 08/21/2012         . prochlorperazine (COMPAZINE) 10 MG tablet   Oral   Take 1 tablet (10 mg total) by mouth every 6 (six) hours as needed.   60 tablet   0   . Sennosides (SENOKOT PO)   Oral   Take 1 tablet by mouth daily as needed (constipation).          Marland Kitchen VITAMIN D, CHOLECALCIFEROL, PO   Oral   Take 1 tablet by mouth daily.          . Vitamin Mixture (VITAMIN E COMPLETE) CAPS   Oral   Take 1 capsule by mouth daily.           BP 175/52  Pulse 74  Temp(Src) 98.9 F (37.2 C)  Resp 20  SpO2 99% Physical Exam  Nursing note and vitals reviewed. Constitutional: She is oriented to person, place, and time. She appears well-developed and well-nourished.  HENT:  Head: Normocephalic.  Eyes: Pupils are equal, round, and reactive to light.  Cardiovascular: Normal rate and regular rhythm.   Pulmonary/Chest: Effort normal and breath sounds normal.  Abdominal: Soft.  Musculoskeletal: She exhibits edema. She exhibits no tenderness.  Lymphadenopathy:    She has no cervical adenopathy.  Neurological: She is alert and oriented to person, place, and time.  Skin: Skin is warm. No rash noted. There is pallor.    ED Course  Procedures (including critical care time) Labs Reviewed  BASIC METABOLIC PANEL  TYPE AND SCREEN  PREPARE RBC (CROSSMATCH)   No results found. 1. Anemia   ED ECG REPORT   Date: 08/28/2012  EKG Time: 11:57 PM  Rate: 70  Rhythm: normal sinus rhythm,  unchanged from previous tracings, there are no previous tracings available for comparison  Axis: normal  Intervals:none  ST&T Change: RSR'in V1 and V2 probable normal variant    Narrative Interpretation: abnormal             MDM  Patient has changed her mind will return in AM for her transfusion    Arman Filter, NP 08/29/12 0008

## 2012-08-29 ENCOUNTER — Ambulatory Visit: Payer: Medicare Other | Admitting: Lab

## 2012-08-29 ENCOUNTER — Ambulatory Visit (HOSPITAL_BASED_OUTPATIENT_CLINIC_OR_DEPARTMENT_OTHER): Payer: Medicare Other

## 2012-08-29 ENCOUNTER — Other Ambulatory Visit: Payer: Self-pay | Admitting: *Deleted

## 2012-08-29 VITALS — BP 201/66 | HR 68 | Temp 98.1°F | Resp 18

## 2012-08-29 DIAGNOSIS — D649 Anemia, unspecified: Secondary | ICD-10-CM

## 2012-08-29 LAB — BASIC METABOLIC PANEL
BUN: 26 mg/dL — ABNORMAL HIGH (ref 6–23)
Creatinine, Ser: 0.98 mg/dL (ref 0.50–1.10)
GFR calc non Af Amer: 52 mL/min — ABNORMAL LOW (ref >90)
Glucose, Bld: 102 mg/dL — ABNORMAL HIGH (ref 70–99)
Potassium: 4.2 mEq/L (ref 3.5–5.1)

## 2012-08-29 LAB — PREPARE RBC (CROSSMATCH)

## 2012-08-29 LAB — HOLD TUBE, BLOOD BANK

## 2012-08-29 LAB — TYPE AND SCREEN
ABO/RH(D): AB POS
Antibody Screen: NEGATIVE

## 2012-08-29 MED ORDER — DIPHENHYDRAMINE HCL 25 MG PO CAPS
25.0000 mg | ORAL_CAPSULE | Freq: Once | ORAL | Status: AC
  Administered 2012-08-29: 25 mg via ORAL

## 2012-08-29 MED ORDER — FUROSEMIDE 10 MG/ML IJ SOLN
20.0000 mg | Freq: Once | INTRAMUSCULAR | Status: AC
  Administered 2012-08-29: 20 mg via INTRAVENOUS

## 2012-08-29 MED ORDER — SODIUM CHLORIDE 0.9 % IV SOLN
250.0000 mL | Freq: Once | INTRAVENOUS | Status: AC
  Administered 2012-08-29: 250 mL via INTRAVENOUS

## 2012-08-29 MED ORDER — FUROSEMIDE 10 MG/ML IJ SOLN: 20.0000 mg | Freq: Once | INTRAMUSCULAR | Status: DC

## 2012-08-29 MED ORDER — ACETAMINOPHEN 325 MG PO TABS
650.0000 mg | ORAL_TABLET | Freq: Once | ORAL | Status: AC
  Administered 2012-08-29: 650 mg via ORAL

## 2012-08-29 NOTE — Patient Instructions (Addendum)
Blood Transfusion Information WHAT IS A BLOOD TRANSFUSION? A transfusion is the replacement of blood or some of its parts. Blood is made up of multiple cells which provide different functions.  Red blood cells carry oxygen and are used for blood loss replacement.  White blood cells fight against infection.  Platelets control bleeding.  Plasma helps clot blood.  Other blood products are available for specialized needs, such as hemophilia or other clotting disorders. BEFORE THE TRANSFUSION  Who gives blood for transfusions?   You may be able to donate blood to be used at a later date on yourself (autologous donation).  Relatives can be asked to donate blood. This is generally not any safer than if you have received blood from a stranger. The same precautions are taken to ensure safety when a relative's blood is donated.  Healthy volunteers who are fully evaluated to make sure their blood is safe. This is blood bank blood. Transfusion therapy is the safest it has ever been in the practice of medicine. Before blood is taken from a donor, a complete history is taken to make sure that person has no history of diseases nor engages in risky social behavior (examples are intravenous drug use or sexual activity with multiple partners). The donor's travel history is screened to minimize risk of transmitting infections, such as malaria. The donated blood is tested for signs of infectious diseases, such as HIV and hepatitis. The blood is then tested to be sure it is compatible with you in order to minimize the chance of a transfusion reaction. If you or a relative donates blood, this is often done in anticipation of surgery and is not appropriate for emergency situations. It takes many days to process the donated blood. RISKS AND COMPLICATIONS Although transfusion therapy is very safe and saves many lives, the main dangers of transfusion include:   Getting an infectious disease.  Developing a  transfusion reaction. This is an allergic reaction to something in the blood you were given. Every precaution is taken to prevent this. The decision to have a blood transfusion has been considered carefully by your caregiver before blood is given. Blood is not given unless the benefits outweigh the risks. AFTER THE TRANSFUSION  Right after receiving a blood transfusion, you will usually feel much better and more energetic. This is especially true if your red blood cells have gotten low (anemic). The transfusion raises the level of the red blood cells which carry oxygen, and this usually causes an energy increase.  The nurse administering the transfusion will monitor you carefully for complications. HOME CARE INSTRUCTIONS  No special instructions are needed after a transfusion. You may find your energy is better. Speak with your caregiver about any limitations on activity for underlying diseases you may have. SEEK MEDICAL CARE IF:   Your condition is not improving after your transfusion.  You develop redness or irritation at the intravenous (IV) site. SEEK IMMEDIATE MEDICAL CARE IF:  Any of the following symptoms occur over the next 12 hours:  Shaking chills.  You have a temperature by mouth above 102 F (38.9 C), not controlled by medicine.  Chest, back, or muscle pain.  People around you feel you are not acting correctly or are confused.  Shortness of breath or difficulty breathing.  Dizziness and fainting.  You get a rash or develop hives.  You have a decrease in urine output.  Your urine turns a dark color or changes to pink, red, or brown. Any of the following   symptoms occur over the next 10 days:  You have a temperature by mouth above 102 F (38.9 C), not controlled by medicine.  Shortness of breath.  Weakness after normal activity.  The white part of the eye turns yellow (jaundice).  You have a decrease in the amount of urine or are urinating less often.  Your  urine turns a dark color or changes to pink, red, or brown. Document Released: 01/30/2000 Document Revised: 04/26/2011 Document Reviewed: 09/18/2007 [employer] Patient Information 2014 [employer].  

## 2012-08-29 NOTE — ED Provider Notes (Signed)
Medical screening examination/treatment/procedure(s) were performed by non-physician practitioner and as supervising physician I was immediately available for consultation/collaboration.  Sunnie Nielsen, MD 08/29/12 [phone]

## 2012-08-29 NOTE — Progress Notes (Signed)
Pt BP on completion of 2nd unit of blood high - see Epic.  Paged Dr. Darnelle Catalan - ordered 20 mg Lasix IV then ok to discharge.   Lasix administered and pt discharged to home.  TKF

## 2012-08-29 NOTE — Progress Notes (Signed)
Melinda from blood bank called stating that pt went to the ED last night after being T and C.  Her blood bracelet was removed.  She went to the ED to get blood but then wanted to leave when she was told how long it would take for a transfusion.  Juliette Alcide stated that she has a difficult blood type to crossmatch and it is possible that she may not be able to get blood today when she is re-typed and crossmatched.  Called and informed pt.  She is coming in for T and C at 1145 and we will inform her about whether we can do the blood today.  She verbalized understanding.  SLJ

## 2012-08-30 LAB — TYPE AND SCREEN
ABO/RH(D): AB POS
Antibody Screen: NEGATIVE

## 2012-08-31 ENCOUNTER — Telehealth: Payer: Self-pay | Admitting: *Deleted

## 2012-08-31 NOTE — Telephone Encounter (Signed)
Spoke with pt regarding schedule today.  She was confused about appt.  I clarified schedule and pt verbalized understanding

## 2012-09-04 ENCOUNTER — Ambulatory Visit (HOSPITAL_COMMUNITY)
Admission: RE | Admit: 2012-09-04 | Discharge: 2012-09-04 | Disposition: A | Discharge status: Home / Self Care | Payer: Medicare Other | Source: Ambulatory Visit | Attending: Internal Medicine | Admitting: Internal Medicine

## 2012-09-04 ENCOUNTER — Encounter: Payer: Self-pay | Admitting: Medical Oncology

## 2012-09-04 ENCOUNTER — Other Ambulatory Visit: Payer: Medicare Other

## 2012-09-04 ENCOUNTER — Other Ambulatory Visit (HOSPITAL_BASED_OUTPATIENT_CLINIC_OR_DEPARTMENT_OTHER): Payer: Medicare Other | Admitting: Lab

## 2012-09-04 ENCOUNTER — Encounter (HOSPITAL_COMMUNITY): Payer: Self-pay

## 2012-09-04 DIAGNOSIS — C3410 Malignant neoplasm of upper lobe, unspecified bronchus or lung: Secondary | ICD-10-CM | POA: Insufficient documentation

## 2012-09-04 DIAGNOSIS — I319 Disease of pericardium, unspecified: Secondary | ICD-10-CM | POA: Insufficient documentation

## 2012-09-04 DIAGNOSIS — Z79899 Other long term (current) drug therapy: Secondary | ICD-10-CM | POA: Insufficient documentation

## 2012-09-04 DIAGNOSIS — C3490 Malignant neoplasm of unspecified part of unspecified bronchus or lung: Secondary | ICD-10-CM

## 2012-09-04 DIAGNOSIS — I7090 Unspecified atherosclerosis: Secondary | ICD-10-CM | POA: Insufficient documentation

## 2012-09-04 DIAGNOSIS — R935 Abnormal findings on diagnostic imaging of other abdominal regions, including retroperitoneum: Secondary | ICD-10-CM | POA: Insufficient documentation

## 2012-09-04 DIAGNOSIS — R911 Solitary pulmonary nodule: Secondary | ICD-10-CM | POA: Insufficient documentation

## 2012-09-04 DIAGNOSIS — J90 Pleural effusion, not elsewhere classified: Secondary | ICD-10-CM | POA: Insufficient documentation

## 2012-09-04 DIAGNOSIS — Z923 Personal history of irradiation: Secondary | ICD-10-CM | POA: Insufficient documentation

## 2012-09-04 DIAGNOSIS — I2510 Atherosclerotic heart disease of native coronary artery without angina pectoris: Secondary | ICD-10-CM | POA: Insufficient documentation

## 2012-09-04 LAB — CBC WITH DIFFERENTIAL/PLATELET
Eosinophils Absolute: 0.1 10*3/uL (ref 0.0–0.5)
LYMPH%: 12.1 % — ABNORMAL LOW (ref 14.0–49.7)
MCV: 83.5 fL (ref 79.5–101.0)
MONO%: 33 % — ABNORMAL HIGH (ref 0.0–14.0)
NEUT#: 1.7 10*3/uL (ref 1.5–6.5)
Platelets: 94 10*3/uL — ABNORMAL LOW (ref 145–400)
RBC: 3.81 10*6/uL (ref 3.70–5.45)
nRBC: 0 % (ref 0–0)

## 2012-09-04 LAB — COMPREHENSIVE METABOLIC PANEL (CC13)
CO2: 25 mEq/L (ref 22–29)
Glucose: 124 mg/dl (ref 70–140)
Sodium: 130 mEq/L — ABNORMAL LOW (ref 136–145)
Total Bilirubin: 0.31 mg/dL (ref 0.20–1.20)
Total Protein: 7 g/dL (ref 6.4–8.3)

## 2012-09-04 MED ORDER — IOHEXOL 300 MG/ML  SOLN
80.0000 mL | Freq: Once | INTRAMUSCULAR | Status: AC | PRN
  Administered 2012-09-04: 80 mL via INTRAVENOUS

## 2012-09-11 ENCOUNTER — Telehealth: Payer: Self-pay | Admitting: *Deleted

## 2012-09-11 ENCOUNTER — Other Ambulatory Visit (HOSPITAL_BASED_OUTPATIENT_CLINIC_OR_DEPARTMENT_OTHER): Payer: Medicare Other | Admitting: Lab

## 2012-09-11 ENCOUNTER — Ambulatory Visit: Payer: Medicare Other

## 2012-09-11 ENCOUNTER — Telehealth: Payer: Self-pay | Admitting: Internal Medicine

## 2012-09-11 ENCOUNTER — Ambulatory Visit (HOSPITAL_BASED_OUTPATIENT_CLINIC_OR_DEPARTMENT_OTHER): Payer: Medicare Other | Admitting: Internal Medicine

## 2012-09-11 ENCOUNTER — Encounter: Payer: Medicare Other | Admitting: Nutrition

## 2012-09-11 ENCOUNTER — Other Ambulatory Visit: Payer: Medicare Other | Admitting: Lab

## 2012-09-11 ENCOUNTER — Encounter: Payer: Self-pay | Admitting: Internal Medicine

## 2012-09-11 DIAGNOSIS — C3410 Malignant neoplasm of upper lobe, unspecified bronchus or lung: Secondary | ICD-10-CM

## 2012-09-11 DIAGNOSIS — C3490 Malignant neoplasm of unspecified part of unspecified bronchus or lung: Secondary | ICD-10-CM

## 2012-09-11 LAB — CBC WITH DIFFERENTIAL/PLATELET
Eosinophils Absolute: 0.1 10*3/uL (ref 0.0–0.5)
LYMPH%: 8.1 % — ABNORMAL LOW (ref 14.0–49.7)
MONO#: 1.2 10*3/uL — ABNORMAL HIGH (ref 0.1–0.9)
NEUT#: 3.5 10*3/uL (ref 1.5–6.5)
Platelets: 163 10*3/uL (ref 145–400)
RBC: 3.3 10*6/uL — ABNORMAL LOW (ref 3.70–5.45)
RDW: 18.9 % — ABNORMAL HIGH (ref 11.2–14.5)
WBC: 5.3 10*3/uL (ref 3.9–10.3)
nRBC: 0 % (ref 0–0)

## 2012-09-11 NOTE — Progress Notes (Signed)
[facility] [phone]   [phone]  OFFICE PROGRESS NOTE  SHAW,KIMBERLEE, MD [address] [place] [address]  DIAGNOSIS AND STAGE: : Metastatic non-small cell lung cancer, adenocarcinoma, negative EGFR mutation, negative ALK gene translocation, presented with right upper lobe lung mass as well as mediastinal lymphadenopathy and left lower lobe lesion diagnosed in April of 2014.   PRIOR THERAPY: Palliative radiotherapy under the care of Dr. Mitzi Hansen to the right upper lobe lung mass and mediastinum expected to be completed on 07/05/2012   CURRENT THERAPY: Systemic chemotherapy with carboplatin for AUC of 5 and Alimta 500 mg/M2 on 07/12/2012, status post 3 cycles.  CHEMOTHERAPY INTENT:  palliative  CURRENT # OF CHEMOTHERAPY CYCLES: 4  CURRENT ANTIEMETICS: Zofran, dexamethasone and Compazine  CURRENT SMOKING STATUS: Nonsmoker  ORAL CHEMOTHERAPY AND CONSENT: None  CURRENT BISPHOSPHONATES USE: None  PAIN MANAGEMENT: No pain  NARCOTICS INDUCED CONSTIPATION: None  LIVING WILL AND CODE STATUS: Full code.  INTERVAL HISTORY: Sylvia [name] 79 y.o. female returns to the clinic today for followup visit accompanied by her daughter. The patient is feeling fine today with no specific complaints except for mild fatigue after her chemotherapy. She denied having any fever or chills. She denied having any nausea or vomiting. The patient denied having any significant chest pain but continues to have shortness breath with exertion and no cough or hemoptysis. The patient tolerated the last cycle of her systemic chemotherapy fairly well with no significant adverse effects. She had repeat CT scan of the chest, abdomen and pelvis performed recently and she is here for evaluation and discussion of her scan results.  MEDICAL HISTORY: Past Medical History  Diagnosis Date  . HTN (hypertension)   . Renal insufficiency   . IBS (irritable bowel  syndrome)   . Anemia 06/2010  . Raynaud's syndrome   . PVD (peripheral vascular disease)   . Carotid stenosis   . Raynaud phenomenon since 1990's  . Hoarseness of voice   . PVD (peripheral vascular disease)   . History of radiation therapy 06/15/2012-07/05/2012    37.5 gray to right lung tumor    ALLERGIES:  has No Known Allergies.  MEDICATIONS:  Current Outpatient Prescriptions  Medication Sig Dispense Refill  . acetaminophen (TYLENOL) 325 MG tablet Take 650 mg by mouth 2 (two) times daily.      . Ascorbic Acid (VITAMIN C PO) Take 1 tablet by mouth daily.      Marland Kitchen BENICAR HCT 40-12.5 MG per tablet Take 1 tablet by mouth daily.       Marland Kitchen dexamethasone (DECADRON) 4 MG tablet 4 Milligram by mouth twice a day the day before, day of and day after the chemotherapy every 3 weeks  40 tablet  1  . felodipine (PLENDIL) 10 MG 24 hr tablet Take 10 mg by mouth daily.       Marland Kitchen FERROUS SULFATE PO Take 1 tablet by mouth daily.      . fish oil-omega-3 fatty acids 1000 MG capsule Take 1 g by mouth daily.      . folic acid (FOLVITE) 1 MG tablet Take 1 tablet (1 mg total) by mouth daily.  30 tablet  4  . Multiple Vitamin (MULTIVITAMIN WITH MINERALS) TABS Take 1 tablet by mouth daily.      Marland Kitchen PRESCRIPTION MEDICATION Inject into the vein once. PEMEtrexed (ALIMTA) 550 mg in sodium chloride 0.9 % 100 mL chemo infusion 375 mg/m2  1.45 m2 (Treatment Plan  Actual)  Once 08/21/2012      . prochlorperazine (COMPAZINE) 10 MG tablet Take 1 tablet (10 mg total) by mouth every 6 (six) hours as needed.  60 tablet  0  . Sennosides (SENOKOT PO) Take 1 tablet by mouth daily as needed (constipation).       Marland Kitchen VITAMIN D, CHOLECALCIFEROL, PO Take 1 tablet by mouth daily.       . Vitamin Mixture (VITAMIN E COMPLETE) CAPS Take 1 capsule by mouth daily.        No current facility-administered medications for this visit.    SURGICAL HISTORY:  Past Surgical History  Procedure Laterality Date  . Colonoscopy  02/2004 and 05/2011    last  colonoscopy in April 2013 was negative by Dr. Laural Benes  . Hemorrhoid surgery    . Breast surgery      lumpectomy- right  . Hemorrhoid surgery    . Artery biopsy  12/30/2011    Procedure: MINOR BIOPSY TEMPORAL ARTERY;  Surgeon: Currie Paris, MD;  Location: [facility];  Service: General;  Laterality: Right;  temoral artery biopsy -right side    REVIEW OF SYSTEMS:  A comprehensive review of systems was negative except for: Constitutional: positive for fatigue   PHYSICAL EXAMINATION: General appearance: alert, cooperative, fatigued and no distress Head: Normocephalic, without obvious abnormality, atraumatic Neck: no adenopathy Lymph nodes: Cervical, supraclavicular, and axillary nodes normal. Resp: clear to auscultation bilaterally Cardio: regular rate and rhythm, S1, S2 normal, no murmur, click, rub or gallop GI: soft, non-tender; bowel sounds normal; no masses,  no organomegaly Extremities: extremities normal, atraumatic, no cyanosis or edema Neurologic: Alert and oriented X 3, normal strength and tone. Normal symmetric reflexes. Normal coordination and gait  ECOG PERFORMANCE STATUS: 1 - Symptomatic but completely ambulatory  Blood pressure 182/68, pulse 74, temperature 97.7 F (36.5 C), temperature source Oral, resp. rate 20, height 5\' 3"  (1.6 m), weight 99 lb 3.2 oz (44.997 kg).  LABORATORY DATA: Lab Results  Component Value Date   WBC 5.3 09/11/2012   HGB 9.4* 09/11/2012   HCT 28.1* 09/11/2012   MCV 85.2 09/11/2012   PLT 163 09/11/2012      Chemistry      Component Value Date/Time   NA 130* 09/04/2012 1419   NA 128* 08/28/2012 2330   K 4.7 09/04/2012 1419   K 4.2 08/28/2012 2330   CL 93* 08/28/2012 2330   CL 98 08/07/2012 1134   CO2 25 09/04/2012 1419   CO2 26 08/28/2012 2330   BUN 24.3 09/04/2012 1419   BUN 26* 08/28/2012 2330   CREATININE 0.9 09/04/2012 1419   CREATININE 0.98 08/28/2012 2330      Component Value Date/Time   CALCIUM 9.1 09/04/2012 1419    CALCIUM 9.2 08/28/2012 2330   ALKPHOS 68 09/04/2012 1419   ALKPHOS 70 06/07/2011 1007   AST 25 09/04/2012 1419   AST 21 06/07/2011 1007   ALT 15 09/04/2012 1419   ALT 12 06/07/2011 1007   BILITOT 0.31 09/04/2012 1419   BILITOT 0.4 06/07/2011 1007       RADIOGRAPHIC STUDIES: Ct Chest W Contrast  09/04/2012   *RADIOLOGY REPORT*  Clinical Data:  Lung cancer.  Ongoing chemotherapy.  Radiation therapy complete.  CT CHEST, ABDOMEN AND PELVIS WITH CONTRAST  Technique:  Multidetector CT imaging of the chest, abdomen and pelvis was performed following the standard protocol during bolus administration of intravenous contrast.  Contrast: 80mL OMNIPAQUE IOHEXOL 300 MG/ML  SOLN  Comparison:  PET 05/18/2012 and CT chest 05/05/2012.  CT abdomen pelvis 08/15/2008.    CT CHEST  Findings:  High right paratracheal lymph node measures 9 mm (previously 11 mm).  Low right paratracheal lymph node measures 12 mm (previously 8 mm).  No axillary adenopathy.  Atherosclerotic calcification of the arterial vasculature, including coronary arteries.  Heart is at the upper limits of normal in size.  Small pericardial effusion, new.  Small right pleural effusion, stable.  A heterogeneous mass in the posterior segment right upper lobe measures 3.9 x 4.3 cm (previously 7.2 x 5.3 cm). A sub solid nodule in the left lower lobe measures 1.3 x 2.0 cm, likely stable.  Mild dependent air space disease in the right lower lobe.  Airway is unremarkable.  IMPRESSION:  1.  Interval decrease in size of the primary right upper lobe mass with mixed response to therapy in mediastinal lymph nodes. 2.  Subsolid left lower lobe nodule is likely stable and worrisome for primary bronchogenic carcinoma. 3.  Small pericardial effusion, new. 4.  Small right pleural effusion, stable.    CT ABDOMEN AND PELVIS  Findings:  Liver is somewhat heterogeneous in attenuation with peripheral and and somewhat nodular areas of hyper attenuation. Gallbladder, adrenal glands,  kidneys, spleen, pancreas, stomach and small bowel are otherwise unremarkable.  Stool is seen in the majority of the colon, indicative of constipation.  Atherosclerotic calcification of the arterial vasculature.  No definite free fluid.  Uterus is visualized.  No definite pathologically enlarged lymph nodes.  Degenerative changes are seen in the spine.  No worrisome lytic or sclerotic lesions.  IMPRESSION:  1.  Mildly heterogeneous appearance of the liver, as detailed above.  While this may be due to arterial phase imaging and elevated right heart pressure, it is difficult to definitively exclude metastatic disease.  If further evaluation is desired, MR abdomen without and with contrast is recommended.   Original Report Authenticated By: Leanna Battles, M.D.   ASSESSMENT AND PLAN: This is a very pleasant 79 years old white female with metastatic non-small cell lung cancer, adenocarcinoma. She is currently undergoing systemic chemotherapy with carboplatin and Alimta and tolerating her treatment fairly well except for the fatigue. The patient had significant improvement in her disease based on the recent CT scan results. I discussed the scan results and showed the images to the patient and her daughter. I recommended for the patient will continue on her current systemic chemotherapy with carboplatin and Alimta with reduced dose of 375 mg/M2. The patient would come back for followup visit in 3 weeks with the next cycle of her systemic chemotherapy. She was advised to call immediately if she has any concerning symptoms in the interval. The patient voices understanding of current disease status and treatment options and is in agreement with the current care plan.  All questions were answered. The patient knows to call the clinic with any problems, questions or concerns. We can certainly see the patient much sooner if necessary.  I spent 15 minutes counseling the patient face to face. The total time spent in the  appointment was 25 minutes.

## 2012-09-11 NOTE — Telephone Encounter (Signed)
Notified MD regarding chemo being moved to tomorrow

## 2012-09-11 NOTE — Telephone Encounter (Signed)
Moved chemo to tomorrow , 0900 per pt rqst. Gave pt appt for lab, MD and chemo for August 2014

## 2012-09-11 NOTE — Patient Instructions (Signed)
DIAGNOSIS AND STAGE: : Metastatic non-small cell lung cancer, adenocarcinoma, negative EGFR mutation, negative ALK gene translocation, presented with right upper lobe lung mass as well as mediastinal lymphadenopathy and left lower lobe lesion diagnosed in April of 2014.   CHEMOTHERAPY INTENT:  palliative  CURRENT # OF CHEMOTHERAPY CYCLES: 4  CURRENT ANTIEMETICS: Zofran, dexamethasone and Compazine  CURRENT SMOKING STATUS: Nonsmoker  ORAL CHEMOTHERAPY AND CONSENT: None  CURRENT BISPHOSPHONATES USE: None  PAIN MANAGEMENT: No pain  NARCOTICS INDUCED CONSTIPATION: None  LIVING WILL AND CODE STATUS: Full code.

## 2012-09-11 NOTE — Telephone Encounter (Signed)
Spoke with pt regarding confusion with schedule.  I clarified questions

## 2012-09-12 ENCOUNTER — Ambulatory Visit (HOSPITAL_BASED_OUTPATIENT_CLINIC_OR_DEPARTMENT_OTHER): Payer: Medicare Other

## 2012-09-12 DIAGNOSIS — C3410 Malignant neoplasm of upper lobe, unspecified bronchus or lung: Secondary | ICD-10-CM

## 2012-09-12 DIAGNOSIS — Z5111 Encounter for antineoplastic chemotherapy: Secondary | ICD-10-CM

## 2012-09-12 MED ORDER — SODIUM CHLORIDE 0.9 % IV SOLN
375.0000 mg/m2 | Freq: Once | INTRAVENOUS | Status: AC
  Administered 2012-09-12: 550 mg via INTRAVENOUS
  Filled 2012-09-12: qty 22

## 2012-09-12 MED ORDER — DEXAMETHASONE SODIUM PHOSPHATE 20 MG/5ML IJ SOLN
20.0000 mg | Freq: Once | INTRAMUSCULAR | Status: AC
  Administered 2012-09-12: 20 mg via INTRAVENOUS

## 2012-09-12 MED ORDER — SODIUM CHLORIDE 0.9 % IV SOLN
Freq: Once | INTRAVENOUS | Status: AC
  Administered 2012-09-12: 09:00:00 via INTRAVENOUS

## 2012-09-12 MED ORDER — SODIUM CHLORIDE 0.9 % IV SOLN
240.0000 mg | Freq: Once | INTRAVENOUS | Status: AC
  Administered 2012-09-12: 240 mg via INTRAVENOUS
  Filled 2012-09-12: qty 24

## 2012-09-12 MED ORDER — ONDANSETRON 16 MG/50ML IVPB (CHCC)
16.0000 mg | Freq: Once | INTRAVENOUS | Status: AC
  Administered 2012-09-12: 16 mg via INTRAVENOUS

## 2012-09-12 NOTE — Patient Instructions (Addendum)
[facility] Discharge Instructions for Patients Receiving Chemotherapy  Today you received the following chemotherapy agents Alimta and Carboplatin.  To help prevent nausea and vomiting after your treatment, we encourage you to take your nausea medication as directed.   If you develop nausea and vomiting that is not controlled by your nausea medication, call the clinic.   BELOW ARE SYMPTOMS THAT SHOULD BE REPORTED IMMEDIATELY:  *FEVER GREATER THAN 100.5 F  *CHILLS WITH OR WITHOUT FEVER  NAUSEA AND VOMITING THAT IS NOT CONTROLLED WITH YOUR NAUSEA MEDICATION  *UNUSUAL SHORTNESS OF BREATH  *UNUSUAL BRUISING OR BLEEDING  TENDERNESS IN MOUTH AND THROAT WITH OR WITHOUT PRESENCE OF ULCERS  *URINARY PROBLEMS  *BOWEL PROBLEMS  UNUSUAL RASH Items with * indicate a potential emergency and should be followed up as soon as possible.  Feel free to call the clinic you have any questions or concerns. The clinic phone number is [phone].    

## 2012-09-13 ENCOUNTER — Other Ambulatory Visit: Payer: Self-pay | Admitting: Certified Registered Nurse Anesthetist

## 2012-09-15 ENCOUNTER — Telehealth: Payer: Self-pay | Admitting: *Deleted

## 2012-09-15 NOTE — Telephone Encounter (Signed)
Pt called and left vm.  I call back and left vm to call when she could.

## 2012-09-18 ENCOUNTER — Encounter (HOSPITAL_BASED_OUTPATIENT_CLINIC_OR_DEPARTMENT_OTHER): Payer: Medicare Other | Admitting: Internal Medicine

## 2012-09-18 ENCOUNTER — Telehealth: Payer: Self-pay | Admitting: *Deleted

## 2012-09-18 DIAGNOSIS — C3410 Malignant neoplasm of upper lobe, unspecified bronchus or lung: Secondary | ICD-10-CM

## 2012-09-18 LAB — CBC WITH DIFFERENTIAL/PLATELET
Basophils Absolute: 0 10*3/uL (ref 0.0–0.1)
Eosinophils Absolute: 0 10*3/uL (ref 0.0–0.5)
HCT: 27.5 % — ABNORMAL LOW (ref 34.8–46.6)
HGB: 9.5 g/dL — ABNORMAL LOW (ref 11.6–15.9)
LYMPH%: 11.2 % — ABNORMAL LOW (ref 14.0–49.7)
MCV: 86.1 fL (ref 79.5–101.0)
MONO#: 0.1 10*3/uL (ref 0.1–0.9)
MONO%: 5.5 % (ref 0.0–14.0)
NEUT#: 2.1 10*3/uL (ref 1.5–6.5)
Platelets: 356 10*3/uL (ref 145–400)
WBC: 2.6 10*3/uL — ABNORMAL LOW (ref 3.9–10.3)

## 2012-09-18 LAB — COMPREHENSIVE METABOLIC PANEL (CC13)
Albumin: 3.3 g/dL — ABNORMAL LOW (ref 3.5–5.0)
Alkaline Phosphatase: 64 U/L (ref 40–150)
BUN: 26.6 mg/dL — ABNORMAL HIGH (ref 7.0–26.0)
CO2: 27 mEq/L (ref 22–29)
Glucose: 100 mg/dl (ref 70–140)
Total Bilirubin: 0.63 mg/dL (ref 0.20–1.20)
Total Protein: 7.1 g/dL (ref 6.4–8.3)

## 2012-09-18 NOTE — Telephone Encounter (Signed)
Patient weighed 98.4 lbs.  Would not remove shoes.  Reports she always wears her shoes when weighed here... It's too much trouble to take them off".

## 2012-09-20 ENCOUNTER — Other Ambulatory Visit: Payer: Self-pay

## 2012-09-20 ENCOUNTER — Telehealth: Payer: Self-pay | Admitting: *Deleted

## 2012-09-20 NOTE — Telephone Encounter (Signed)
Pt called and left vm message she was concerned about her blood counts. Dr. Arbutus Ped is aware and no need for intervention at this time.  I left vm message with pt stating this and to call with any other concerns .

## 2012-09-20 NOTE — Progress Notes (Signed)
Erroneous Encounter

## 2012-09-25 ENCOUNTER — Encounter: Payer: Self-pay | Admitting: Internal Medicine

## 2012-09-25 ENCOUNTER — Ambulatory Visit (HOSPITAL_BASED_OUTPATIENT_CLINIC_OR_DEPARTMENT_OTHER): Payer: Medicare Other | Admitting: Medical Oncology

## 2012-09-25 DIAGNOSIS — C3490 Malignant neoplasm of unspecified part of unspecified bronchus or lung: Secondary | ICD-10-CM

## 2012-09-25 DIAGNOSIS — C3410 Malignant neoplasm of upper lobe, unspecified bronchus or lung: Secondary | ICD-10-CM

## 2012-09-25 LAB — CBC WITH DIFFERENTIAL/PLATELET
Basophils Absolute: 0 10*3/uL (ref 0.0–0.1)
Eosinophils Absolute: 0 10*3/uL (ref 0.0–0.5)
HGB: 7.6 g/dL — ABNORMAL LOW (ref 11.6–15.9)
MCV: 86.9 fL (ref 79.5–101.0)
MONO#: 0.7 10*3/uL (ref 0.1–0.9)
MONO%: 25.8 % — ABNORMAL HIGH (ref 0.0–14.0)
NEUT#: 1.7 10*3/uL (ref 1.5–6.5)
Platelets: 130 10*3/uL — ABNORMAL LOW (ref 145–400)
RBC: 2.49 10*6/uL — ABNORMAL LOW (ref 3.70–5.45)
RDW: 21.2 % — ABNORMAL HIGH (ref 11.2–14.5)
WBC: 2.9 10*3/uL — ABNORMAL LOW (ref 3.9–10.3)

## 2012-09-25 LAB — COMPREHENSIVE METABOLIC PANEL (CC13)
Albumin: 3 g/dL — ABNORMAL LOW (ref 3.5–5.0)
BUN: 31.5 mg/dL — ABNORMAL HIGH (ref 7.0–26.0)
CO2: 25 mEq/L (ref 22–29)
Calcium: 9.1 mg/dL (ref 8.4–10.4)
Glucose: 136 mg/dl (ref 70–140)
Potassium: 4.6 mEq/L (ref 3.5–5.1)
Sodium: 134 mEq/L — ABNORMAL LOW (ref 136–145)
Total Protein: 6.6 g/dL (ref 6.4–8.3)

## 2012-09-25 NOTE — Progress Notes (Signed)
Faxed fmla form to [employer] @ [phone] °

## 2012-09-26 ENCOUNTER — Encounter: Payer: Self-pay | Admitting: Radiation Oncology

## 2012-09-26 NOTE — Progress Notes (Signed)
Returned call to patient about check she received from [employer].  I called Heather at [employer] and she stated that this was a refund for Sylvia [name] because her she had overpaid and Sylvia [name] had also paid.  Called Sylvia [name] back and left a vm, gave her this information and left my number for her to call back with any questions.

## 2012-10-02 ENCOUNTER — Ambulatory Visit: Payer: Medicare Other | Admitting: Nutrition

## 2012-10-02 ENCOUNTER — Ambulatory Visit (HOSPITAL_BASED_OUTPATIENT_CLINIC_OR_DEPARTMENT_OTHER): Payer: Medicare Other

## 2012-10-02 ENCOUNTER — Encounter: Payer: Self-pay | Admitting: Internal Medicine

## 2012-10-02 ENCOUNTER — Other Ambulatory Visit: Payer: Self-pay | Admitting: *Deleted

## 2012-10-02 ENCOUNTER — Ambulatory Visit (HOSPITAL_BASED_OUTPATIENT_CLINIC_OR_DEPARTMENT_OTHER): Payer: Medicare Other | Admitting: Internal Medicine

## 2012-10-02 ENCOUNTER — Other Ambulatory Visit (HOSPITAL_BASED_OUTPATIENT_CLINIC_OR_DEPARTMENT_OTHER): Payer: Medicare Other

## 2012-10-02 ENCOUNTER — Encounter (HOSPITAL_COMMUNITY)
Admission: RE | Admit: 2012-10-02 | Discharge: 2012-10-02 | Disposition: A | Discharge status: Home / Self Care | Payer: Medicare Other | Source: Ambulatory Visit | Attending: Family Medicine | Admitting: Family Medicine

## 2012-10-02 VITALS — BP 151/35 | HR 81 | Temp 97.4°F | Resp 20 | Ht 63.0 in | Wt 100.1 lb

## 2012-10-02 DIAGNOSIS — R5381 Other malaise: Secondary | ICD-10-CM

## 2012-10-02 DIAGNOSIS — C771 Secondary and unspecified malignant neoplasm of intrathoracic lymph nodes: Secondary | ICD-10-CM

## 2012-10-02 DIAGNOSIS — D6481 Anemia due to antineoplastic chemotherapy: Secondary | ICD-10-CM

## 2012-10-02 DIAGNOSIS — Z5111 Encounter for antineoplastic chemotherapy: Secondary | ICD-10-CM

## 2012-10-02 DIAGNOSIS — C3410 Malignant neoplasm of upper lobe, unspecified bronchus or lung: Secondary | ICD-10-CM

## 2012-10-02 DIAGNOSIS — D649 Anemia, unspecified: Secondary | ICD-10-CM | POA: Insufficient documentation

## 2012-10-02 LAB — CBC WITH DIFFERENTIAL/PLATELET
Basophils Absolute: 0 10*3/uL (ref 0.0–0.1)
Eosinophils Absolute: 0 10*3/uL (ref 0.0–0.5)
HGB: 7.4 g/dL — ABNORMAL LOW (ref 11.6–15.9)
MCV: 87 fL (ref 79.5–101.0)
MONO#: 0.4 10*3/uL (ref 0.1–0.9)
MONO%: 7.7 % (ref 0.0–14.0)
NEUT#: 4.5 10*3/uL (ref 1.5–6.5)
RBC: 2.54 10*6/uL — ABNORMAL LOW (ref 3.70–5.45)
RDW: 22.3 % — ABNORMAL HIGH (ref 11.2–14.5)
WBC: 5.5 10*3/uL (ref 3.9–10.3)
lymph#: 0.5 10*3/uL — ABNORMAL LOW (ref 0.9–3.3)
nRBC: 0 % (ref 0–0)

## 2012-10-02 LAB — COMPREHENSIVE METABOLIC PANEL (CC13)
ALT: 13 U/L (ref 0–55)
AST: 20 U/L (ref 5–34)
Albumin: 3.2 g/dL — ABNORMAL LOW (ref 3.5–5.0)
Alkaline Phosphatase: 60 U/L (ref 40–150)
BUN: 36 mg/dL — ABNORMAL HIGH (ref 7.0–26.0)
CO2: 25 mEq/L (ref 22–29)
Calcium: 9.7 mg/dL (ref 8.4–10.4)
Chloride: 99 mEq/L (ref 98–109)
Creatinine: 1 mg/dL (ref 0.6–1.1)
Glucose: 184 mg/dl — ABNORMAL HIGH (ref 70–140)
Potassium: 4.6 mEq/L (ref 3.5–5.1)
Sodium: 134 mEq/L — ABNORMAL LOW (ref 136–145)
Total Bilirubin: 0.22 mg/dL (ref 0.20–1.20)
Total Protein: 7.1 g/dL (ref 6.4–8.3)

## 2012-10-02 MED ORDER — SODIUM CHLORIDE 0.9 % IV SOLN
225.6000 mg | Freq: Once | INTRAVENOUS | Status: AC
  Administered 2012-10-02: 230 mg via INTRAVENOUS
  Filled 2012-10-02: qty 23

## 2012-10-02 MED ORDER — ONDANSETRON 16 MG/50ML IVPB (CHCC)
16.0000 mg | Freq: Once | INTRAVENOUS | Status: AC
  Administered 2012-10-02: 16 mg via INTRAVENOUS

## 2012-10-02 MED ORDER — SODIUM CHLORIDE 0.9 % IV SOLN
Freq: Once | INTRAVENOUS | Status: AC
  Administered 2012-10-02: 14:00:00 via INTRAVENOUS

## 2012-10-02 MED ORDER — SODIUM CHLORIDE 0.9 % IV SOLN
375.0000 mg/m2 | Freq: Once | INTRAVENOUS | Status: AC
  Administered 2012-10-02: 550 mg via INTRAVENOUS
  Filled 2012-10-02: qty 22

## 2012-10-02 MED ORDER — DEXAMETHASONE SODIUM PHOSPHATE 20 MG/5ML IJ SOLN
20.0000 mg | Freq: Once | INTRAMUSCULAR | Status: AC
  Administered 2012-10-02: 20 mg via INTRAVENOUS

## 2012-10-02 NOTE — Progress Notes (Signed)
[facility] [phone]   [phone]  OFFICE PROGRESS NOTE  SHAW,KIMBERLEE, MD [address] [employer] [employer] [place] [address]  DIAGNOSIS AND STAGE: : Metastatic non-small cell lung cancer, adenocarcinoma, negative EGFR mutation, negative ALK gene translocation, presented with right upper lobe lung mass as well as mediastinal lymphadenopathy and left lower lobe lesion diagnosed in April of 2014.   PRIOR THERAPY: Palliative radiotherapy under the care of Dr. Mitzi Hansen to the right upper lobe lung mass and mediastinum expected to be completed on 07/05/2012   CURRENT THERAPY: Systemic chemotherapy with carboplatin for AUC of 5 and Alimta 500 mg/M2 on 07/12/2012, status post 4 cycles.   CHEMOTHERAPY INTENT: palliative  CURRENT # OF CHEMOTHERAPY CYCLES: 5  CURRENT ANTIEMETICS: Zofran, dexamethasone and Compazine  CURRENT SMOKING STATUS: Nonsmoker  ORAL CHEMOTHERAPY AND CONSENT: None  CURRENT BISPHOSPHONATES USE: None  PAIN MANAGEMENT: No pain  NARCOTICS INDUCED CONSTIPATION: None  LIVING WILL AND CODE STATUS: Full code.   INTERVAL HISTORY: Sylvia [name] 79 y.o. female returns to the clinic today for followup visit accompanied by a friend. The patient continues to complain of fatigue and weakness most likely secondary to chemotherapy-induced anemia. She denied having any significant chest pain but has shortness breath with exertion. No cough or hemoptysis. The patient denied having any fever or chills. She has no nausea or vomiting. She is here today to start cycle #5 of her chemotherapy.  MEDICAL HISTORY: Past Medical History  Diagnosis Date  . HTN (hypertension)   . Renal insufficiency   . IBS (irritable bowel syndrome)   . Anemia 06/2010  . Raynaud's syndrome   . PVD (peripheral vascular disease)   . Carotid stenosis   . Raynaud phenomenon since 1990's  . Hoarseness of voice   . PVD (peripheral vascular disease)   . History of radiation  therapy 06/15/2012-07/05/2012    37.5 gray to right lung tumor    ALLERGIES:  has No Known Allergies.  MEDICATIONS:  Current Outpatient Prescriptions  Medication Sig Dispense Refill  . acetaminophen (TYLENOL) 325 MG tablet Take 650 mg by mouth 2 (two) times daily.      . Ascorbic Acid (VITAMIN C PO) Take 1 tablet by mouth daily.      Marland Kitchen BENICAR HCT 40-12.5 MG per tablet Take 1 tablet by mouth daily.       Marland Kitchen dexamethasone (DECADRON) 4 MG tablet 4 Milligram by mouth twice a day the day before, day of and day after the chemotherapy every 3 weeks  40 tablet  1  . felodipine (PLENDIL) 10 MG 24 hr tablet Take 10 mg by mouth daily.       Marland Kitchen FERROUS SULFATE PO Take 1 tablet by mouth daily.      . fish oil-omega-3 fatty acids 1000 MG capsule Take 1 g by mouth daily.      . folic acid (FOLVITE) 1 MG tablet Take 1 tablet (1 mg total) by mouth daily.  30 tablet  4  . Multiple Vitamin (MULTIVITAMIN WITH MINERALS) TABS Take 1 tablet by mouth daily.      Marland Kitchen PRESCRIPTION MEDICATION Inject into the vein once. PEMEtrexed (ALIMTA) 550 mg in sodium chloride 0.9 % 100 mL chemo infusion 375 mg/m2  1.45 m2 (Treatment Plan Actual)  Once 08/21/2012      . prochlorperazine (COMPAZINE) 10 MG tablet Take 1 tablet (10 mg total) by mouth every 6 (six) hours as needed.  60 tablet  0  .  Sennosides (SENOKOT PO) Take 1 tablet by mouth daily as needed (constipation).       Marland Kitchen VITAMIN D, CHOLECALCIFEROL, PO Take 1 tablet by mouth daily.       . Vitamin Mixture (VITAMIN E COMPLETE) CAPS Take 1 capsule by mouth daily.        No current facility-administered medications for this visit.    SURGICAL HISTORY:  Past Surgical History  Procedure Laterality Date  . Colonoscopy  02/2004 and 05/2011    last colonoscopy in April 2013 was negative by Dr. Laural Benes  . Hemorrhoid surgery    . Breast surgery      lumpectomy- right  . Hemorrhoid surgery    . Artery biopsy  12/30/2011    Procedure: MINOR BIOPSY TEMPORAL ARTERY;  Surgeon:  Currie Paris, MD;  Location: [facility];  Service: General;  Laterality: Right;  temoral artery biopsy -right side    REVIEW OF SYSTEMS:  A comprehensive review of systems was negative except for: Constitutional: positive for fatigue Respiratory: positive for dyspnea on exertion   PHYSICAL EXAMINATION: General appearance: alert, cooperative and no distress Head: Normocephalic, without obvious abnormality, atraumatic Neck: no adenopathy Lymph nodes: Cervical, supraclavicular, and axillary nodes normal. Resp: clear to auscultation bilaterally Cardio: regular rate and rhythm, S1, S2 normal, no murmur, click, rub or gallop GI: soft, non-tender; bowel sounds normal; no masses,  no organomegaly Extremities: extremities normal, atraumatic, no cyanosis or edema Neurologic: Alert and oriented X 3, normal strength and tone. Normal symmetric reflexes. Normal coordination and gait  ECOG PERFORMANCE STATUS: 1 - Symptomatic but completely ambulatory  Blood pressure 151/35, pulse 81, temperature 97.4 F (36.3 C), temperature source Oral, resp. rate 20, height 5\' 3"  (1.6 m), weight 100 lb 1.6 oz (45.405 kg).  LABORATORY DATA: Lab Results  Component Value Date   WBC 5.5 10/02/2012   HGB 7.4* 10/02/2012   HCT 22.1* 10/02/2012   MCV 87.0 10/02/2012   PLT 276 10/02/2012      Chemistry      Component Value Date/Time   NA 134* 09/25/2012 1129   NA 128* 08/28/2012 2330   K 4.6 09/25/2012 1129   K 4.2 08/28/2012 2330   CL 93* 08/28/2012 2330   CL 98 08/07/2012 1134   CO2 25 09/25/2012 1129   CO2 26 08/28/2012 2330   BUN 31.5* 09/25/2012 1129   BUN 26* 08/28/2012 2330   CREATININE 0.9 09/25/2012 1129   CREATININE 0.98 08/28/2012 2330      Component Value Date/Time   CALCIUM 9.1 09/25/2012 1129   CALCIUM 9.2 08/28/2012 2330   ALKPHOS 65 09/25/2012 1129   ALKPHOS 70 06/07/2011 1007   AST 26 09/25/2012 1129   AST 21 06/07/2011 1007   ALT 15 09/25/2012 1129   ALT 12 06/07/2011 1007   BILITOT  0.29 09/25/2012 1129   BILITOT 0.4 06/07/2011 1007       RADIOGRAPHIC STUDIES: Ct Chest W Contrast  09/04/2012   *RADIOLOGY REPORT*  Clinical Data:  Lung cancer.  Ongoing chemotherapy.  Radiation therapy complete.  CT CHEST, ABDOMEN AND PELVIS WITH CONTRAST  Technique:  Multidetector CT imaging of the chest, abdomen and pelvis was performed following the standard protocol during bolus administration of intravenous contrast.  Contrast: 80mL OMNIPAQUE IOHEXOL 300 MG/ML  SOLN  Comparison:  PET 05/18/2012 and CT chest 05/05/2012.  CT abdomen pelvis 08/15/2008.  CT CHEST  Findings:  High right paratracheal lymph node measures 9 mm (previously 11 mm).  Low right  paratracheal lymph node measures 12 mm (previously 8 mm).  No axillary adenopathy.  Atherosclerotic calcification of the arterial vasculature, including coronary arteries.  Heart is at the upper limits of normal in size.  Small pericardial effusion, new.  Small right pleural effusion, stable.  A heterogeneous mass in the posterior segment right upper lobe measures 3.9 x 4.3 cm (previously 7.2 x 5.3 cm). A sub solid nodule in the left lower lobe measures 1.3 x 2.0 cm, likely stable.  Mild dependent air space disease in the right lower lobe.  Airway is unremarkable.  IMPRESSION:  1.  Interval decrease in size of the primary right upper lobe mass with mixed response to therapy in mediastinal lymph nodes. 2.  Subsolid left lower lobe nodule is likely stable and worrisome for primary bronchogenic carcinoma. 3.  Small pericardial effusion, new. 4.  Small right pleural effusion, stable.  CT ABDOMEN AND PELVIS  Findings:  Liver is somewhat heterogeneous in attenuation with peripheral and and somewhat nodular areas of hyper attenuation. Gallbladder, adrenal glands, kidneys, spleen, pancreas, stomach and small bowel are otherwise unremarkable.  Stool is seen in the majority of the colon, indicative of constipation.  Atherosclerotic calcification of the arterial  vasculature.  No definite free fluid.  Uterus is visualized.  No definite pathologically enlarged lymph nodes.  Degenerative changes are seen in the spine.  No worrisome lytic or sclerotic lesions.  IMPRESSION:  1.  Mildly heterogeneous appearance of the liver, as detailed above.  While this may be due to arterial phase imaging and elevated right heart pressure, it is difficult to definitively exclude metastatic disease.  If further evaluation is desired, MR abdomen without and with contrast is recommended.   Original Report Authenticated By: Leanna Battles, M.D.   Ct Abdomen Pelvis W Contrast  09/04/2012   *RADIOLOGY REPORT*  Clinical Data:  Lung cancer.  Ongoing chemotherapy.  Radiation therapy complete.  CT CHEST, ABDOMEN AND PELVIS WITH CONTRAST  Technique:  Multidetector CT imaging of the chest, abdomen and pelvis was performed following the standard protocol during bolus administration of intravenous contrast.  Contrast: 80mL OMNIPAQUE IOHEXOL 300 MG/ML  SOLN  Comparison:  PET 05/18/2012 and CT chest 05/05/2012.  CT abdomen pelvis 08/15/2008.  CT CHEST  Findings:  High right paratracheal lymph node measures 9 mm (previously 11 mm).  Low right paratracheal lymph node measures 12 mm (previously 8 mm).  No axillary adenopathy.  Atherosclerotic calcification of the arterial vasculature, including coronary arteries.  Heart is at the upper limits of normal in size.  Small pericardial effusion, new.  Small right pleural effusion, stable.  A heterogeneous mass in the posterior segment right upper lobe measures 3.9 x 4.3 cm (previously 7.2 x 5.3 cm). A sub solid nodule in the left lower lobe measures 1.3 x 2.0 cm, likely stable.  Mild dependent air space disease in the right lower lobe.  Airway is unremarkable.  IMPRESSION:  1.  Interval decrease in size of the primary right upper lobe mass with mixed response to therapy in mediastinal lymph nodes. 2.  Subsolid left lower lobe nodule is likely stable and worrisome  for primary bronchogenic carcinoma. 3.  Small pericardial effusion, new. 4.  Small right pleural effusion, stable.  CT ABDOMEN AND PELVIS  Findings:  Liver is somewhat heterogeneous in attenuation with peripheral and and somewhat nodular areas of hyper attenuation. Gallbladder, adrenal glands, kidneys, spleen, pancreas, stomach and small bowel are otherwise unremarkable.  Stool is seen in the majority of the colon,  indicative of constipation.  Atherosclerotic calcification of the arterial vasculature.  No definite free fluid.  Uterus is visualized.  No definite pathologically enlarged lymph nodes.  Degenerative changes are seen in the spine.  No worrisome lytic or sclerotic lesions.  IMPRESSION:  1.  Mildly heterogeneous appearance of the liver, as detailed above.  While this may be due to arterial phase imaging and elevated right heart pressure, it is difficult to definitively exclude metastatic disease.  If further evaluation is desired, MR abdomen without and with contrast is recommended.   Original Report Authenticated By: Leanna Battles, M.D.    ASSESSMENT AND PLAN: This is a very pleasant 79 years old white female with metastatic non-small cell lung cancer, adenocarcinoma currently undergoing systemic chemotherapy with carboplatin and Alimta status post 4 cycles. The patient is tolerating her treatment fairly well except for the fatigue from the chemotherapy-induced anemia. I recommended for the patient to proceed with cycle #5 today as scheduled but reduce the dose of carboplatin to AUC of 4 and Alimta to 375 MG/M2. For the chemotherapy-induced anemia, I will arrange for the patient to receive 2 units of PRBCs transfusion in the next few days.  The patient voices understanding of current disease status and treatment options and is in agreement with the current care plan.  All questions were answered. The patient knows to call the clinic with any problems, questions or concerns. We can certainly see  the patient much sooner if necessary.  I spent 15 minutes counseling the patient face to face. The total time spent in the appointment was 25 minutes.

## 2012-10-02 NOTE — Patient Instructions (Signed)
CURRENT THERAPY: Systemic chemotherapy with carboplatin for AUC of 5 and Alimta 500 mg/M2 on 07/12/2012, status post 4 cycles.  CHEMOTHERAPY INTENT: palliative  CURRENT # OF CHEMOTHERAPY CYCLES: 5  CURRENT ANTIEMETICS: Zofran, dexamethasone and Compazine  CURRENT SMOKING STATUS: Nonsmoker  ORAL CHEMOTHERAPY AND CONSENT: None  CURRENT BISPHOSPHONATES USE: None  PAIN MANAGEMENT: No pain  NARCOTICS INDUCED CONSTIPATION: None  LIVING WILL AND CODE STATUS: Full code.

## 2012-10-02 NOTE — Progress Notes (Signed)
Patient reports she is feeling well.  She states she is trying to eat more.  Her weight has increased to 100.1 pounds.  This is increased from 97.6 pounds June 16.  She reminds me her usual body weight is 105 pounds.  Patient has no questions or concerns today regarding nutrition.  Patient requests potato chips and strawberry ice cream for snack.  Nutrition diagnosis of unintended weight loss improved.  Intervention: I have encouraged patient to continue strategies for increased oral intake to promote weight gain to usual body weight.  Monitoring, evaluation, goals: Patient is tolerating increased calories achieving a slight weight gain.    Next visit: There is no followup scheduled.

## 2012-10-02 NOTE — Patient Instructions (Addendum)
[facility] Discharge Instructions for Patients Receiving Chemotherapy  Today you received the following chemotherapy agents Alimta/Carboplatin To help prevent nausea and vomiting after your treatment, we encourage you to take your nausea medication as prescribed.  If you develop nausea and vomiting that is not controlled by your nausea medication, call the clinic.   BELOW ARE SYMPTOMS THAT SHOULD BE REPORTED IMMEDIATELY:  *FEVER GREATER THAN 100.5 F  *CHILLS WITH OR WITHOUT FEVER  NAUSEA AND VOMITING THAT IS NOT CONTROLLED WITH YOUR NAUSEA MEDICATION  *UNUSUAL SHORTNESS OF BREATH  *UNUSUAL BRUISING OR BLEEDING  TENDERNESS IN MOUTH AND THROAT WITH OR WITHOUT PRESENCE OF ULCERS  *URINARY PROBLEMS  *BOWEL PROBLEMS  UNUSUAL RASH Items with * indicate a potential emergency and should be followed up as soon as possible.  Feel free to call the clinic you have any questions or concerns. The clinic phone number is [phone].    

## 2012-10-03 ENCOUNTER — Other Ambulatory Visit: Payer: Self-pay | Admitting: *Deleted

## 2012-10-03 ENCOUNTER — Telehealth: Payer: Self-pay | Admitting: Internal Medicine

## 2012-10-03 ENCOUNTER — Ambulatory Visit (HOSPITAL_BASED_OUTPATIENT_CLINIC_OR_DEPARTMENT_OTHER): Payer: Medicare Other

## 2012-10-03 ENCOUNTER — Telehealth: Payer: Self-pay | Admitting: *Deleted

## 2012-10-03 VITALS — BP 161/68 | HR 72 | Temp 97.9°F | Resp 18

## 2012-10-03 DIAGNOSIS — D649 Anemia, unspecified: Secondary | ICD-10-CM

## 2012-10-03 MED ORDER — ACETAMINOPHEN 325 MG PO TABS
650.0000 mg | ORAL_TABLET | Freq: Once | ORAL | Status: AC
  Administered 2012-10-03: 650 mg via ORAL

## 2012-10-03 MED ORDER — DIPHENHYDRAMINE HCL 25 MG PO CAPS
25.0000 mg | ORAL_CAPSULE | Freq: Once | ORAL | Status: AC
  Administered 2012-10-03: 25 mg via ORAL

## 2012-10-03 MED ORDER — SODIUM CHLORIDE 0.9 % IV SOLN
250.0000 mL | Freq: Once | INTRAVENOUS | Status: AC
  Administered 2012-10-03: 250 mL via INTRAVENOUS

## 2012-10-03 NOTE — Telephone Encounter (Signed)
Per staff message I have adjusted appt for 9/8 

## 2012-10-03 NOTE — Telephone Encounter (Signed)
Per staff message and POF I have scheduled appts.  JMW  

## 2012-10-03 NOTE — Patient Instructions (Addendum)
Blood Transfusion Information WHAT IS A BLOOD TRANSFUSION? A transfusion is the replacement of blood or some of its parts. Blood is made up of multiple cells which provide different functions.  Red blood cells carry oxygen and are used for blood loss replacement.  White blood cells fight against infection.  Platelets control bleeding.  Plasma helps clot blood.  Other blood products are available for specialized needs, such as hemophilia or other clotting disorders. BEFORE THE TRANSFUSION  Who gives blood for transfusions?   You may be able to donate blood to be used at a later date on yourself (autologous donation).  Relatives can be asked to donate blood. This is generally not any safer than if you have received blood from a stranger. The same precautions are taken to ensure safety when a relative's blood is donated.  Healthy volunteers who are fully evaluated to make sure their blood is safe. This is blood bank blood. Transfusion therapy is the safest it has ever been in the practice of medicine. Before blood is taken from a donor, a complete history is taken to make sure that person has no history of diseases nor engages in risky social behavior (examples are intravenous drug use or sexual activity with multiple partners). The donor's travel history is screened to minimize risk of transmitting infections, such as malaria. The donated blood is tested for signs of infectious diseases, such as HIV and hepatitis. The blood is then tested to be sure it is compatible with you in order to minimize the chance of a transfusion reaction. If you or a relative donates blood, this is often done in anticipation of surgery and is not appropriate for emergency situations. It takes many days to process the donated blood. RISKS AND COMPLICATIONS Although transfusion therapy is very safe and saves many lives, the main dangers of transfusion include:   Getting an infectious disease.  Developing a  transfusion reaction. This is an allergic reaction to something in the blood you were given. Every precaution is taken to prevent this. The decision to have a blood transfusion has been considered carefully by your caregiver before blood is given. Blood is not given unless the benefits outweigh the risks. AFTER THE TRANSFUSION  Right after receiving a blood transfusion, you will usually feel much better and more energetic. This is especially true if your red blood cells have gotten low (anemic). The transfusion raises the level of the red blood cells which carry oxygen, and this usually causes an energy increase.  The nurse administering the transfusion will monitor you carefully for complications. HOME CARE INSTRUCTIONS  No special instructions are needed after a transfusion. You may find your energy is better. Speak with your caregiver about any limitations on activity for underlying diseases you may have. SEEK MEDICAL CARE IF:   Your condition is not improving after your transfusion.  You develop redness or irritation at the intravenous (IV) site. SEEK IMMEDIATE MEDICAL CARE IF:  Any of the following symptoms occur over the next 12 hours:  Shaking chills.  You have a temperature by mouth above 102 F (38.9 C), not controlled by medicine.  Chest, back, or muscle pain.  People around you feel you are not acting correctly or are confused.  Shortness of breath or difficulty breathing.  Dizziness and fainting.  You get a rash or develop hives.  You have a decrease in urine output.  Your urine turns a dark color or changes to pink, red, or brown. Any of the following   symptoms occur over the next 10 days:  You have a temperature by mouth above 102 F (38.9 C), not controlled by medicine.  Shortness of breath.  Weakness after normal activity.  The white part of the eye turns yellow (jaundice).  You have a decrease in the amount of urine or are urinating less often.  Your  urine turns a dark color or changes to pink, red, or brown. Document Released: 01/30/2000 Document Revised: 04/26/2011 Document Reviewed: 09/18/2007 [employer] Patient Information 2014 [employer].  

## 2012-10-03 NOTE — Telephone Encounter (Signed)
gv and printed appt sched and avs for pt....MW added tx   °

## 2012-10-03 NOTE — Telephone Encounter (Signed)
Pt came by today and r/s labs, ML and chemo to earlier time on August and September 2014

## 2012-10-04 LAB — TYPE AND SCREEN
Antibody Screen: NEGATIVE
Unit division: 0
Unit division: 0

## 2012-10-09 ENCOUNTER — Ambulatory Visit: Payer: Medicare Other | Admitting: Physician Assistant

## 2012-10-09 ENCOUNTER — Other Ambulatory Visit (HOSPITAL_BASED_OUTPATIENT_CLINIC_OR_DEPARTMENT_OTHER): Payer: Medicare Other

## 2012-10-09 DIAGNOSIS — C3410 Malignant neoplasm of upper lobe, unspecified bronchus or lung: Secondary | ICD-10-CM

## 2012-10-09 LAB — CBC WITH DIFFERENTIAL/PLATELET
Basophils Absolute: 0 10*3/uL (ref 0.0–0.1)
EOS%: 2.1 % (ref 0.0–7.0)
Eosinophils Absolute: 0 10*3/uL (ref 0.0–0.5)
HCT: 29.2 % — ABNORMAL LOW (ref 34.8–46.6)
HGB: 10.1 g/dL — ABNORMAL LOW (ref 11.6–15.9)
MCH: 30.8 pg (ref 25.1–34.0)
MONO#: 0.3 10*3/uL (ref 0.1–0.9)
NEUT#: 1.2 10*3/uL — ABNORMAL LOW (ref 1.5–6.5)
RDW: 21.2 % — ABNORMAL HIGH (ref 11.2–14.5)
WBC: 1.9 10*3/uL — ABNORMAL LOW (ref 3.9–10.3)
lymph#: 0.3 10*3/uL — ABNORMAL LOW (ref 0.9–3.3)

## 2012-10-09 LAB — COMPREHENSIVE METABOLIC PANEL (CC13)
AST: 27 U/L (ref 5–34)
Albumin: 3 g/dL — ABNORMAL LOW (ref 3.5–5.0)
BUN: 32.9 mg/dL — ABNORMAL HIGH (ref 7.0–26.0)
CO2: 25 mEq/L (ref 22–29)
Calcium: 8.9 mg/dL (ref 8.4–10.4)
Chloride: 101 mEq/L (ref 98–109)
Potassium: 4.1 mEq/L (ref 3.5–5.1)

## 2012-10-11 ENCOUNTER — Encounter: Payer: Self-pay | Admitting: *Deleted

## 2012-10-11 ENCOUNTER — Telehealth: Payer: Self-pay | Admitting: *Deleted

## 2012-10-11 NOTE — Progress Notes (Signed)
Spoke with daughter regarding upcoming appointment days.  She verbalized understanding of time and place

## 2012-10-17 ENCOUNTER — Other Ambulatory Visit (HOSPITAL_BASED_OUTPATIENT_CLINIC_OR_DEPARTMENT_OTHER): Payer: Medicare Other

## 2012-10-17 DIAGNOSIS — C3410 Malignant neoplasm of upper lobe, unspecified bronchus or lung: Secondary | ICD-10-CM

## 2012-10-17 LAB — CBC WITH DIFFERENTIAL/PLATELET
BASO%: 0.2 % (ref 0.0–2.0)
EOS%: 0.9 % (ref 0.0–7.0)
HGB: 8.8 g/dL — ABNORMAL LOW (ref 11.6–15.9)
MCH: 31.3 pg (ref 25.1–34.0)
MCHC: 34.6 g/dL (ref 31.5–36.0)
MONO#: 1.1 10*3/uL — ABNORMAL HIGH (ref 0.1–0.9)
RDW: 21.1 % — ABNORMAL HIGH (ref 11.2–14.5)
WBC: 4.7 10*3/uL (ref 3.9–10.3)
lymph#: 0.5 10*3/uL — ABNORMAL LOW (ref 0.9–3.3)

## 2012-10-17 LAB — COMPREHENSIVE METABOLIC PANEL (CC13)
ALT: 17 U/L (ref 0–55)
AST: 25 U/L (ref 5–34)
Albumin: 3.1 g/dL — ABNORMAL LOW (ref 3.5–5.0)
Calcium: 9.2 mg/dL (ref 8.4–10.4)
Chloride: 101 mEq/L (ref 98–109)
Potassium: 4.3 mEq/L (ref 3.5–5.1)
Total Protein: 7 g/dL (ref 6.4–8.3)

## 2012-10-23 ENCOUNTER — Telehealth: Payer: Self-pay | Admitting: Internal Medicine

## 2012-10-23 ENCOUNTER — Encounter: Payer: Self-pay | Admitting: Physician Assistant

## 2012-10-23 ENCOUNTER — Ambulatory Visit (HOSPITAL_COMMUNITY)
Admission: RE | Admit: 2012-10-23 | Discharge: 2012-10-23 | Disposition: A | Discharge status: Home / Self Care | Payer: Medicare Other | Source: Ambulatory Visit | Attending: Internal Medicine | Admitting: Internal Medicine

## 2012-10-23 ENCOUNTER — Other Ambulatory Visit (HOSPITAL_BASED_OUTPATIENT_CLINIC_OR_DEPARTMENT_OTHER): Payer: Medicare Other | Admitting: Lab

## 2012-10-23 ENCOUNTER — Encounter: Payer: Self-pay | Admitting: Internal Medicine

## 2012-10-23 ENCOUNTER — Other Ambulatory Visit: Payer: Medicare Other | Admitting: Lab

## 2012-10-23 ENCOUNTER — Ambulatory Visit (HOSPITAL_BASED_OUTPATIENT_CLINIC_OR_DEPARTMENT_OTHER): Payer: Medicare Other | Admitting: Physician Assistant

## 2012-10-23 ENCOUNTER — Other Ambulatory Visit: Payer: Self-pay | Admitting: Physician Assistant

## 2012-10-23 ENCOUNTER — Ambulatory Visit (HOSPITAL_BASED_OUTPATIENT_CLINIC_OR_DEPARTMENT_OTHER): Payer: Medicare Other

## 2012-10-23 VITALS — BP 162/64 | HR 74 | Temp 97.5°F | Resp 20

## 2012-10-23 DIAGNOSIS — D6481 Anemia due to antineoplastic chemotherapy: Secondary | ICD-10-CM

## 2012-10-23 DIAGNOSIS — R8290 Unspecified abnormal findings in urine: Secondary | ICD-10-CM

## 2012-10-23 DIAGNOSIS — C3410 Malignant neoplasm of upper lobe, unspecified bronchus or lung: Secondary | ICD-10-CM

## 2012-10-23 DIAGNOSIS — Z5111 Encounter for antineoplastic chemotherapy: Secondary | ICD-10-CM

## 2012-10-23 DIAGNOSIS — R82998 Other abnormal findings in urine: Secondary | ICD-10-CM

## 2012-10-23 DIAGNOSIS — T451X5A Adverse effect of antineoplastic and immunosuppressive drugs, initial encounter: Secondary | ICD-10-CM | POA: Insufficient documentation

## 2012-10-23 DIAGNOSIS — D649 Anemia, unspecified: Secondary | ICD-10-CM

## 2012-10-23 LAB — COMPREHENSIVE METABOLIC PANEL (CC13)
AST: 27 U/L (ref 5–34)
Albumin: 3.5 g/dL (ref 3.5–5.0)
Alkaline Phosphatase: 79 U/L (ref 40–150)
BUN: 32.8 mg/dL — ABNORMAL HIGH (ref 7.0–26.0)
Potassium: 4.6 mEq/L (ref 3.5–5.1)
Sodium: 134 mEq/L — ABNORMAL LOW (ref 136–145)

## 2012-10-23 LAB — URINALYSIS, MICROSCOPIC - CHCC
Ketones: NEGATIVE mg/dL
Nitrite: NEGATIVE
Urobilinogen, UR: 0.2 mg/dL (ref 0.2–1)

## 2012-10-23 LAB — CBC WITH DIFFERENTIAL/PLATELET
BASO%: 0.2 % (ref 0.0–2.0)
EOS%: 1.1 % (ref 0.0–7.0)
MCH: 30.7 pg (ref 25.1–34.0)
MCHC: 33.6 g/dL (ref 31.5–36.0)
NEUT%: 80.9 % — ABNORMAL HIGH (ref 38.4–76.8)
RBC: 2.77 10*6/uL — ABNORMAL LOW (ref 3.70–5.45)
RDW: 20.6 % — ABNORMAL HIGH (ref 11.2–14.5)
lymph#: 0.7 10*3/uL — ABNORMAL LOW (ref 0.9–3.3)

## 2012-10-23 MED ORDER — SODIUM CHLORIDE 0.9 % IV SOLN: 250.0000 mL | Freq: Once | INTRAVENOUS | Status: DC

## 2012-10-23 MED ORDER — DEXAMETHASONE SODIUM PHOSPHATE 20 MG/5ML IJ SOLN
INTRAMUSCULAR | Status: AC
  Filled 2012-10-23: qty 5

## 2012-10-23 MED ORDER — SODIUM CHLORIDE 0.9 % IJ SOLN
10.0000 mL | INTRAMUSCULAR | Status: DC | PRN
  Filled 2012-10-23: qty 10

## 2012-10-23 MED ORDER — DEXAMETHASONE SODIUM PHOSPHATE 20 MG/5ML IJ SOLN
20.0000 mg | Freq: Once | INTRAMUSCULAR | Status: AC
  Administered 2012-10-23: 20 mg via INTRAVENOUS

## 2012-10-23 MED ORDER — CYANOCOBALAMIN 1000 MCG/ML IJ SOLN
INTRAMUSCULAR | Status: AC
  Administered 2012-10-23: 1000 ug via INTRAMUSCULAR
  Filled 2012-10-23: qty 1

## 2012-10-23 MED ORDER — HEPARIN SOD (PORK) LOCK FLUSH 100 UNIT/ML IV SOLN
500.0000 [IU] | Freq: Every day | INTRAVENOUS | Status: DC | PRN
  Filled 2012-10-23: qty 5

## 2012-10-23 MED ORDER — ONDANSETRON 16 MG/50ML IVPB (CHCC)
16.0000 mg | Freq: Once | INTRAVENOUS | Status: AC
  Administered 2012-10-23: 16 mg via INTRAVENOUS

## 2012-10-23 MED ORDER — SODIUM CHLORIDE 0.9 % IJ SOLN
3.0000 mL | INTRAMUSCULAR | Status: DC | PRN
  Filled 2012-10-23: qty 10

## 2012-10-23 MED ORDER — CYANOCOBALAMIN 1000 MCG/ML IJ SOLN
1000.0000 ug | Freq: Once | INTRAMUSCULAR | Status: AC
  Administered 2012-10-23: 1000 ug via INTRAMUSCULAR

## 2012-10-23 MED ORDER — SODIUM CHLORIDE 0.9 % IV SOLN
230.0000 mg | Freq: Once | INTRAVENOUS | Status: AC
  Administered 2012-10-23: 230 mg via INTRAVENOUS
  Filled 2012-10-23: qty 23

## 2012-10-23 MED ORDER — ONDANSETRON 16 MG/50ML IVPB (CHCC)
INTRAVENOUS | Status: AC
  Filled 2012-10-23: qty 16

## 2012-10-23 MED ORDER — HEPARIN SOD (PORK) LOCK FLUSH 100 UNIT/ML IV SOLN
250.0000 [IU] | INTRAVENOUS | Status: DC | PRN
  Filled 2012-10-23: qty 5

## 2012-10-23 MED ORDER — ACETAMINOPHEN 325 MG PO TABS: 650.0000 mg | ORAL_TABLET | Freq: Once | ORAL | Status: DC

## 2012-10-23 MED ORDER — DIPHENHYDRAMINE HCL 25 MG PO CAPS: 25.0000 mg | ORAL_CAPSULE | Freq: Once | ORAL | Status: DC

## 2012-10-23 MED ORDER — SODIUM CHLORIDE 0.9 % IV SOLN
375.0000 mg/m2 | Freq: Once | INTRAVENOUS | Status: AC
  Administered 2012-10-23: 550 mg via INTRAVENOUS
  Filled 2012-10-23: qty 22

## 2012-10-23 MED ORDER — SODIUM CHLORIDE 0.9 % IV SOLN: Freq: Once | INTRAVENOUS | Status: DC

## 2012-10-23 NOTE — Telephone Encounter (Signed)
Gave pt appt for lab and MD, and PRBC tomorrow 0830, gave pt oral contrast for CT September 2014

## 2012-10-23 NOTE — Progress Notes (Addendum)
[facility] [phone]   [phone]  SHARED VISIT PROGRESS NOTE  SHAW,KIMBERLEE, MD [address] [place] [address]  DIAGNOSIS AND STAGE: : Metastatic non-small cell lung cancer, adenocarcinoma, negative EGFR mutation, negative ALK gene translocation, presented with right upper lobe lung mass as well as mediastinal lymphadenopathy and left lower lobe lesion diagnosed in April of 2014.   PRIOR THERAPY: Palliative radiotherapy under the care of Dr. Mitzi Hansen to the right upper lobe lung mass and mediastinum expected to be completed on 07/05/2012   CURRENT THERAPY: Systemic chemotherapy with carboplatin for AUC of 5 and Alimta 500 mg/M2 on 07/12/2012, status post 5 cycles.   CHEMOTHERAPY INTENT: palliative  CURRENT # OF CHEMOTHERAPY CYCLES: 6 CURRENT ANTIEMETICS: Zofran, dexamethasone and Compazine  CURRENT SMOKING STATUS: Nonsmoker  ORAL CHEMOTHERAPY AND CONSENT: None  CURRENT BISPHOSPHONATES USE: None  PAIN MANAGEMENT: No pain  NARCOTICS INDUCED CONSTIPATION: None  LIVING WILL AND CODE STATUS: Full code.   INTERVAL HISTORY: Sylvia [name] 79 y.o. female returns to the clinic today for followup visit accompanied by her daughter, Sylvia [name]. The patient continues to complain of fatigue and weakness most likely secondary to chemotherapy-induced anemia. She complains of decreased energy. Chills reports decreased by mouth intake both fluids and food. She's had some stomach pain  And nausea for the past several days. She also complains that her urine smells file. She has not noticed any blood in her bowel movements or urine. She has a history of a problems with constipation but is currently managing that well with MiraLAX and Senokot. She denied having any significant chest pain but has shortness breath with exertion. No cough or hemoptysis. The patient denied having any fever or chills. She denied vomiting. She is here today to start cycle #6 of  her chemotherapy.  MEDICAL HISTORY: Past Medical History  Diagnosis Date  . HTN (hypertension)   . Renal insufficiency   . IBS (irritable bowel syndrome)   . Anemia 06/2010  . Raynaud's syndrome   . PVD (peripheral vascular disease)   . Carotid stenosis   . Raynaud phenomenon since 1990's  . Hoarseness of voice   . PVD (peripheral vascular disease)   . History of radiation therapy 06/15/2012-07/05/2012    37.5 gray to right lung tumor    ALLERGIES:  has No Known Allergies.  MEDICATIONS:  Current Outpatient Prescriptions  Medication Sig Dispense Refill  . acetaminophen (TYLENOL) 325 MG tablet Take 650 mg by mouth 2 (two) times daily.      . Ascorbic Acid (VITAMIN C PO) Take 1 tablet by mouth daily.      Marland Kitchen BENICAR HCT 40-12.5 MG per tablet Take 1 tablet by mouth daily.       Marland Kitchen dexamethasone (DECADRON) 4 MG tablet 4 Milligram by mouth twice a day the day before, day of and day after the chemotherapy every 3 weeks  40 tablet  1  . felodipine (PLENDIL) 10 MG 24 hr tablet Take 10 mg by mouth daily.       Marland Kitchen FERROUS SULFATE PO Take 1 tablet by mouth daily.      . fish oil-omega-3 fatty acids 1000 MG capsule Take 1 g by mouth daily.      . folic acid (FOLVITE) 1 MG tablet Take 1 tablet (1 mg total) by mouth daily.  30 tablet  4  . Multiple Vitamin (MULTIVITAMIN WITH MINERALS) TABS Take 1 tablet by mouth daily.      Marland Kitchen  PRESCRIPTION MEDICATION Inject into the vein once. PEMEtrexed (ALIMTA) 550 mg in sodium chloride 0.9 % 100 mL chemo infusion 375 mg/m2  1.45 m2 (Treatment Plan Actual)  Once 08/21/2012      . prochlorperazine (COMPAZINE) 10 MG tablet Take 1 tablet (10 mg total) by mouth every 6 (six) hours as needed.  60 tablet  0  . Sennosides (SENOKOT PO) Take 1 tablet by mouth daily as needed (constipation).       Marland Kitchen VITAMIN D, CHOLECALCIFEROL, PO Take 1 tablet by mouth daily.       . Vitamin Mixture (VITAMIN E COMPLETE) CAPS Take 1 capsule by mouth daily.        No current  facility-administered medications for this visit.    SURGICAL HISTORY:  Past Surgical History  Procedure Laterality Date  . Colonoscopy  02/2004 and 05/2011    last colonoscopy in April 2013 was negative by Dr. Laural Benes  . Hemorrhoid surgery    . Breast surgery      lumpectomy- right  . Hemorrhoid surgery    . Artery biopsy  12/30/2011    Procedure: MINOR BIOPSY TEMPORAL ARTERY;  Surgeon: Currie Paris, MD;  Location: [facility];  Service: General;  Laterality: Right;  temoral artery biopsy -right side    REVIEW OF SYSTEMS:  A comprehensive review of systems was negative except for: Constitutional: positive for anorexia, fatigue and weight loss Respiratory: positive for dyspnea on exertion Gastrointestinal: positive for abdominal pain and nausea Genitourinary: positive for Foul-smelling urine   PHYSICAL EXAMINATION: General appearance: alert, cooperative and no distress Head: Normocephalic, without obvious abnormality, atraumatic Neck: no adenopathy Lymph nodes: Cervical, supraclavicular, and axillary nodes normal. Resp: clear to auscultation bilaterally Cardio: regular rate and rhythm, S1, S2 normal, no murmur, click, rub or gallop GI: Soft mild generalized tenderness no rebound or referred pain, bowel sounds present normally active in all quadrants, no suprapubic tenderness Extremities: extremities normal, atraumatic, no cyanosis or edema Neurologic: Alert and oriented X 3, normal strength and tone. Normal symmetric reflexes. Normal coordination and gait  ECOG PERFORMANCE STATUS: 1 - Symptomatic but completely ambulatory  Blood pressure 120/38, pulse 76, temperature 97.6 F (36.4 C), temperature source Oral, resp. rate 20, height 5\' 3"  (1.6 m), weight 98 lb 14.4 oz (44.861 kg).  LABORATORY DATA: Lab Results  Component Value Date   WBC 9.1 10/23/2012   HGB 8.5* 10/23/2012   HCT 25.3* 10/23/2012   MCV 91.3 10/23/2012   PLT 297 10/23/2012      Chemistry       Component Value Date/Time   NA 134* 10/23/2012 0912   NA 128* 08/28/2012 2330   K 4.6 10/23/2012 0912   K 4.2 08/28/2012 2330   CL 93* 08/28/2012 2330   CL 98 08/07/2012 1134   CO2 25 10/23/2012 0912   CO2 26 08/28/2012 2330   BUN 32.8* 10/23/2012 0912   BUN 26* 08/28/2012 2330   CREATININE 0.9 10/23/2012 0912   CREATININE 0.98 08/28/2012 2330      Component Value Date/Time   CALCIUM 9.8 10/23/2012 0912   CALCIUM 9.2 08/28/2012 2330   ALKPHOS 79 10/23/2012 0912   ALKPHOS 70 06/07/2011 1007   AST 27 10/23/2012 0912   AST 21 06/07/2011 1007   ALT 16 10/23/2012 0912   ALT 12 06/07/2011 1007   BILITOT 0.34 10/23/2012 0912   BILITOT 0.4 06/07/2011 1007       RADIOGRAPHIC STUDIES: Ct Chest W Contrast  09/04/2012   *RADIOLOGY  REPORT*  Clinical Data:  Lung cancer.  Ongoing chemotherapy.  Radiation therapy complete.  CT CHEST, ABDOMEN AND PELVIS WITH CONTRAST  Technique:  Multidetector CT imaging of the chest, abdomen and pelvis was performed following the standard protocol during bolus administration of intravenous contrast.  Contrast: 80mL OMNIPAQUE IOHEXOL 300 MG/ML  SOLN  Comparison:  PET 05/18/2012 and CT chest 05/05/2012.  CT abdomen pelvis 08/15/2008.  CT CHEST  Findings:  High right paratracheal lymph node measures 9 mm (previously 11 mm).  Low right paratracheal lymph node measures 12 mm (previously 8 mm).  No axillary adenopathy.  Atherosclerotic calcification of the arterial vasculature, including coronary arteries.  Heart is at the upper limits of normal in size.  Small pericardial effusion, new.  Small right pleural effusion, stable.  A heterogeneous mass in the posterior segment right upper lobe measures 3.9 x 4.3 cm (previously 7.2 x 5.3 cm). A sub solid nodule in the left lower lobe measures 1.3 x 2.0 cm, likely stable.  Mild dependent air space disease in the right lower lobe.  Airway is unremarkable.  IMPRESSION:  1.  Interval decrease in size of the primary right upper lobe mass with mixed response to  therapy in mediastinal lymph nodes. 2.  Subsolid left lower lobe nodule is likely stable and worrisome for primary bronchogenic carcinoma. 3.  Small pericardial effusion, new. 4.  Small right pleural effusion, stable.  CT ABDOMEN AND PELVIS  Findings:  Liver is somewhat heterogeneous in attenuation with peripheral and and somewhat nodular areas of hyper attenuation. Gallbladder, adrenal glands, kidneys, spleen, pancreas, stomach and small bowel are otherwise unremarkable.  Stool is seen in the majority of the colon, indicative of constipation.  Atherosclerotic calcification of the arterial vasculature.  No definite free fluid.  Uterus is visualized.  No definite pathologically enlarged lymph nodes.  Degenerative changes are seen in the spine.  No worrisome lytic or sclerotic lesions.  IMPRESSION:  1.  Mildly heterogeneous appearance of the liver, as detailed above.  While this may be due to arterial phase imaging and elevated right heart pressure, it is difficult to definitively exclude metastatic disease.  If further evaluation is desired, MR abdomen without and with contrast is recommended.   Original Report Authenticated By: Leanna Battles, M.D.   Ct Abdomen Pelvis W Contrast  09/04/2012   *RADIOLOGY REPORT*  Clinical Data:  Lung cancer.  Ongoing chemotherapy.  Radiation therapy complete.  CT CHEST, ABDOMEN AND PELVIS WITH CONTRAST  Technique:  Multidetector CT imaging of the chest, abdomen and pelvis was performed following the standard protocol during bolus administration of intravenous contrast.  Contrast: 80mL OMNIPAQUE IOHEXOL 300 MG/ML  SOLN  Comparison:  PET 05/18/2012 and CT chest 05/05/2012.  CT abdomen pelvis 08/15/2008.  CT CHEST  Findings:  High right paratracheal lymph node measures 9 mm (previously 11 mm).  Low right paratracheal lymph node measures 12 mm (previously 8 mm).  No axillary adenopathy.  Atherosclerotic calcification of the arterial vasculature, including coronary arteries.  Heart is  at the upper limits of normal in size.  Small pericardial effusion, new.  Small right pleural effusion, stable.  A heterogeneous mass in the posterior segment right upper lobe measures 3.9 x 4.3 cm (previously 7.2 x 5.3 cm). A sub solid nodule in the left lower lobe measures 1.3 x 2.0 cm, likely stable.  Mild dependent air space disease in the right lower lobe.  Airway is unremarkable.  IMPRESSION:  1.  Interval decrease in size of the primary  right upper lobe mass with mixed response to therapy in mediastinal lymph nodes. 2.  Subsolid left lower lobe nodule is likely stable and worrisome for primary bronchogenic carcinoma. 3.  Small pericardial effusion, new. 4.  Small right pleural effusion, stable.  CT ABDOMEN AND PELVIS  Findings:  Liver is somewhat heterogeneous in attenuation with peripheral and and somewhat nodular areas of hyper attenuation. Gallbladder, adrenal glands, kidneys, spleen, pancreas, stomach and small bowel are otherwise unremarkable.  Stool is seen in the majority of the colon, indicative of constipation.  Atherosclerotic calcification of the arterial vasculature.  No definite free fluid.  Uterus is visualized.  No definite pathologically enlarged lymph nodes.  Degenerative changes are seen in the spine.  No worrisome lytic or sclerotic lesions.  IMPRESSION:  1.  Mildly heterogeneous appearance of the liver, as detailed above.  While this may be due to arterial phase imaging and elevated right heart pressure, it is difficult to definitively exclude metastatic disease.  If further evaluation is desired, MR abdomen without and with contrast is recommended.   Original Report Authenticated By: Leanna Battles, M.D.    ASSESSMENT AND PLAN: This is a very pleasant 79 years old white female with metastatic non-small cell lung cancer, adenocarcinoma currently undergoing systemic chemotherapy with carboplatin and Alimta status post 4 cycles. The patient is tolerating her treatment fairly well except  for the fatigue from the chemotherapy-induced anemia. Patient was discussed with him also seen by Dr. Arbutus Ped. She will proceed with cycle #6 of her systemic chemotherapy with reduced dose carboplatin for an AUC of 4 and Alimta at 375 mg per meter squared.  For the chemotherapy-induced anemia, we will arrange for the patient to receive 2 units of PRBCs transfusion today.  She'll followup with Dr. Arbutus Ped in 3 weeks with a restaging CT scan of the chest, abdomen and pelvis with contrast to reevaluate her disease. She'll continue with weekly labs as scheduled in the interim.  Conni Slipper, PA-C   The patient voices understanding of current disease status and treatment options and is in agreement with the current care plan.  All questions were answered. The patient knows to call the clinic with any problems, questions or concerns. We can certainly see the patient much sooner if necessary.  ADDENDUM: Hematology/Oncology Attending: I have a face to face encounter with the patient. I recommended her care plan. This is a very pleasant 79 years old white female with metastatic non-small cell lung cancer currently undergoing systemic chemotherapy with reduced dose carboplatin for AUC of 4 and Alimta 375 mg/M2 status post 5 cycles. The patient is doing fine today except for mild fatigue. We'll proceed with cycle #6 today as scheduled. We will also arrange for the patient to receive 2 units of PRBCs transfusion for the chemotherapy-induced. She would come back for follow up visit in 3 weeks with repeat CT scan of the chest, abdomen and pelvis for restaging of her disease. She was advised to call immediately if she has any concerning symptoms in the interval. Lajuana Matte., MD 10/24/2012

## 2012-10-23 NOTE — Patient Instructions (Signed)
Your hemoglobin is low and we will arrange to transfuse you a total of 2 units of packed red blood cells Continue weekly labs as scheduled Follow with Dr. Arbutus Ped in 3 weeks with a restaging CT scan of your chest, abdomen and pelvis to reevaluate your disease

## 2012-10-23 NOTE — Patient Instructions (Signed)
[facility] Discharge Instructions for Patients Receiving Chemotherapy  Today you received the following chemotherapy agents Carbo/Alimta  To help prevent nausea and vomiting after your treatment, we encourage you to take your nausea medication as directed   If you develop nausea and vomiting that is not controlled by your nausea medication, call the clinic.   BELOW ARE SYMPTOMS THAT SHOULD BE REPORTED IMMEDIATELY:  *FEVER GREATER THAN 100.5 F  *CHILLS WITH OR WITHOUT FEVER  NAUSEA AND VOMITING THAT IS NOT CONTROLLED WITH YOUR NAUSEA MEDICATION  *UNUSUAL SHORTNESS OF BREATH  *UNUSUAL BRUISING OR BLEEDING  TENDERNESS IN MOUTH AND THROAT WITH OR WITHOUT PRESENCE OF ULCERS  *URINARY PROBLEMS  *BOWEL PROBLEMS  UNUSUAL RASH Items with * indicate a potential emergency and should be followed up as soon as possible.  Feel free to call the clinic you have any questions or concerns. The clinic phone number is [phone].  Blood Transfusion  A blood transfusion replaces your blood or some of its parts. Blood is replaced when you have lost blood because of surgery, an accident, or for severe blood conditions like anemia. You can donate blood to be used on yourself if you have a planned surgery. If you lose blood during that surgery, your own blood can be given back to you. Any blood given to you is checked to make sure it matches your blood type. Your temperature, blood pressure, and heart rate (vital signs) will be checked often.  GET HELP RIGHT AWAY IF:   You feel sick to your stomach (nauseous) or throw up (vomit).  You have watery poop (diarrhea).  You have shortness of breath or trouble breathing.  You have blood in your pee (urine) or have dark colored pee.  You have chest pain or tightness.  Your eyes or skin turn yellow (jaundice).  You have a temperature by mouth above 102 F (38.9 C), not controlled by medicine.  You start to shake and have  chills.  You develop a a red rash (hives) or feel itchy.  You develop lightheadedness or feel confused.  You develop back, joint, or muscle pain.  You do not feel hungry (lost appetite).  You feel tired, restless, or nervous.  You develop belly (abdominal) cramps. Document Released: 04/30/2008 Document Revised: 04/26/2011 Document Reviewed: 04/30/2008 [facility] Patient Information 2014 [place], [place].

## 2012-10-24 ENCOUNTER — Ambulatory Visit (HOSPITAL_BASED_OUTPATIENT_CLINIC_OR_DEPARTMENT_OTHER): Payer: Medicare Other

## 2012-10-24 VITALS — BP 159/54 | HR 69 | Temp 97.9°F | Resp 18

## 2012-10-24 DIAGNOSIS — D649 Anemia, unspecified: Secondary | ICD-10-CM

## 2012-10-24 MED ORDER — SODIUM CHLORIDE 0.9 % IV SOLN
250.0000 mL | Freq: Once | INTRAVENOUS | Status: AC
  Administered 2012-10-24: 250 mL via INTRAVENOUS

## 2012-10-24 MED ORDER — ACETAMINOPHEN 325 MG PO TABS
650.0000 mg | ORAL_TABLET | Freq: Once | ORAL | Status: AC
  Administered 2012-10-24: 650 mg via ORAL

## 2012-10-24 MED ORDER — ACETAMINOPHEN 325 MG PO TABS
ORAL_TABLET | ORAL | Status: AC
  Filled 2012-10-24: qty 2

## 2012-10-24 NOTE — Patient Instructions (Addendum)
Blood Transfusion  A blood transfusion replaces your blood or some of its parts. Blood is replaced when you have lost blood because of surgery, an accident, or for severe blood conditions like anemia. You can donate blood to be used on yourself if you have a planned surgery. If you lose blood during that surgery, your own blood can be given back to you. Any blood given to you is checked to make sure it matches your blood type. Your temperature, blood pressure, and heart rate (vital signs) will be checked often.  GET HELP RIGHT AWAY IF:   You feel sick to your stomach (nauseous) or throw up (vomit).  You have watery poop (diarrhea).  You have shortness of breath or trouble breathing.  You have blood in your pee (urine) or have dark colored pee.  You have chest pain or tightness.  Your eyes or skin turn yellow (jaundice).  You have a temperature by mouth above 102 F (38.9 C), not controlled by medicine.  You start to shake and have chills.  You develop a a red rash (hives) or feel itchy.  You develop lightheadedness or feel confused.  You develop back, joint, or muscle pain.  You do not feel hungry (lost appetite).  You feel tired, restless, or nervous.  You develop belly (abdominal) cramps. Document Released: 04/30/2008 Document Revised: 04/26/2011 Document Reviewed: 04/30/2008 [employer] Patient Information 2014 [employer].  

## 2012-10-25 ENCOUNTER — Other Ambulatory Visit: Payer: Self-pay | Admitting: Physician Assistant

## 2012-10-25 LAB — TYPE AND SCREEN: ABO/RH(D): AB POS

## 2012-11-07 ENCOUNTER — Other Ambulatory Visit: Payer: Self-pay | Admitting: *Deleted

## 2012-11-07 DIAGNOSIS — C3490 Malignant neoplasm of unspecified part of unspecified bronchus or lung: Secondary | ICD-10-CM

## 2012-11-07 MED ORDER — PROCHLORPERAZINE MALEATE 10 MG PO TABS: 10.0000 mg | ORAL_TABLET | Freq: Four times a day (QID) | ORAL | Status: DC | PRN

## 2012-11-10 ENCOUNTER — Ambulatory Visit (HOSPITAL_COMMUNITY)
Admission: RE | Admit: 2012-11-10 | Discharge: 2012-11-10 | Disposition: A | Discharge status: Home / Self Care | Payer: Medicare Other | Source: Ambulatory Visit | Attending: Physician Assistant | Admitting: Physician Assistant

## 2012-11-10 ENCOUNTER — Encounter (HOSPITAL_COMMUNITY): Payer: Self-pay

## 2012-11-10 ENCOUNTER — Ambulatory Visit (HOSPITAL_COMMUNITY): Payer: Medicare Other

## 2012-11-10 DIAGNOSIS — J438 Other emphysema: Secondary | ICD-10-CM | POA: Insufficient documentation

## 2012-11-10 DIAGNOSIS — R222 Localized swelling, mass and lump, trunk: Secondary | ICD-10-CM | POA: Insufficient documentation

## 2012-11-10 DIAGNOSIS — K869 Disease of pancreas, unspecified: Secondary | ICD-10-CM | POA: Insufficient documentation

## 2012-11-10 DIAGNOSIS — E279 Disorder of adrenal gland, unspecified: Secondary | ICD-10-CM | POA: Insufficient documentation

## 2012-11-10 DIAGNOSIS — C3490 Malignant neoplasm of unspecified part of unspecified bronchus or lung: Secondary | ICD-10-CM | POA: Insufficient documentation

## 2012-11-10 DIAGNOSIS — I2510 Atherosclerotic heart disease of native coronary artery without angina pectoris: Secondary | ICD-10-CM | POA: Insufficient documentation

## 2012-11-10 DIAGNOSIS — J90 Pleural effusion, not elsewhere classified: Secondary | ICD-10-CM | POA: Insufficient documentation

## 2012-11-10 MED ORDER — IOHEXOL 300 MG/ML  SOLN
80.0000 mL | Freq: Once | INTRAMUSCULAR | Status: AC | PRN
  Administered 2012-11-10: 80 mL via INTRAVENOUS

## 2012-11-13 ENCOUNTER — Other Ambulatory Visit (HOSPITAL_BASED_OUTPATIENT_CLINIC_OR_DEPARTMENT_OTHER): Payer: Medicare Other

## 2012-11-13 ENCOUNTER — Encounter: Payer: Self-pay | Admitting: Internal Medicine

## 2012-11-13 ENCOUNTER — Telehealth: Payer: Self-pay | Admitting: Internal Medicine

## 2012-11-13 ENCOUNTER — Telehealth: Payer: Self-pay | Admitting: *Deleted

## 2012-11-13 ENCOUNTER — Ambulatory Visit (HOSPITAL_BASED_OUTPATIENT_CLINIC_OR_DEPARTMENT_OTHER): Payer: Medicare Other | Admitting: Internal Medicine

## 2012-11-13 VITALS — BP 139/84 | HR 69 | Temp 97.5°F | Resp 20 | Ht 63.0 in | Wt 102.2 lb

## 2012-11-13 DIAGNOSIS — E279 Disorder of adrenal gland, unspecified: Secondary | ICD-10-CM

## 2012-11-13 DIAGNOSIS — C3491 Malignant neoplasm of unspecified part of right bronchus or lung: Secondary | ICD-10-CM

## 2012-11-13 DIAGNOSIS — C3410 Malignant neoplasm of upper lobe, unspecified bronchus or lung: Secondary | ICD-10-CM

## 2012-11-13 LAB — CBC WITH DIFFERENTIAL/PLATELET
Basophils Absolute: 0 10*3/uL (ref 0.0–0.1)
EOS%: 1.5 % (ref 0.0–7.0)
Eosinophils Absolute: 0.1 10*3/uL (ref 0.0–0.5)
HCT: 27 % — ABNORMAL LOW (ref 34.8–46.6)
HGB: 9.1 g/dL — ABNORMAL LOW (ref 11.6–15.9)
LYMPH%: 8.9 % — ABNORMAL LOW (ref 14.0–49.7)
MCHC: 33.6 g/dL (ref 31.5–36.0)
MONO#: 1.4 10*3/uL — ABNORMAL HIGH (ref 0.1–0.9)
NEUT%: 64.9 % (ref 38.4–76.8)
Platelets: 317 10*3/uL (ref 145–400)
RDW: 21.3 % — ABNORMAL HIGH (ref 11.2–14.5)
WBC: 5.8 10*3/uL (ref 3.9–10.3)

## 2012-11-13 LAB — COMPREHENSIVE METABOLIC PANEL (CC13)
BUN: 29.2 mg/dL — ABNORMAL HIGH (ref 7.0–26.0)
CO2: 26 mEq/L (ref 22–29)
Calcium: 9.1 mg/dL (ref 8.4–10.4)
Chloride: 97 mEq/L — ABNORMAL LOW (ref 98–109)
Creatinine: 0.9 mg/dL (ref 0.6–1.1)
Glucose: 91 mg/dl (ref 70–140)
Potassium: 4.6 mEq/L (ref 3.5–5.1)
Total Bilirubin: 0.27 mg/dL (ref 0.20–1.20)

## 2012-11-13 NOTE — Telephone Encounter (Signed)
Per staff message and POF I have scheduled appts.  JMW  

## 2012-11-13 NOTE — Progress Notes (Signed)
[facility] [phone]   [phone]  OFFICE PROGRESS NOTE  SHAW,KIMBERLEE, MD [address] [place] [address]  DIAGNOSIS AND STAGE: : Metastatic non-small cell lung cancer, adenocarcinoma, negative EGFR mutation, negative ALK gene translocation, presented with right upper lobe lung mass as well as mediastinal lymphadenopathy and left lower lobe lesion diagnosed in April of 2014.   PRIOR THERAPY:  1) Palliative radiotherapy under the care of Dr. Mitzi Hansen to the right upper lobe lung mass and mediastinum expected to be completed on 07/05/2012. 2) Systemic chemotherapy with carboplatin for AUC of 4 and Alimta 375 mg/M2 on 07/12/2012, status post 5 cycles. Last cycle on 10/23/12.  CURRENT THERAPY: Maintenance chemotherapy with single agent Alimta 375mg /M2 every 3 weeks. First cycle expected on 11/16/2012  CHEMOTHERAPY INTENT: palliative/maintenance CURRENT # OF CHEMOTHERAPY CYCLES: 1 CURRENT ANTIEMETICS: Zofran, dexamethasone and Compazine  CURRENT SMOKING STATUS: Nonsmoker  ORAL CHEMOTHERAPY AND CONSENT: None  CURRENT BISPHOSPHONATES USE: None  PAIN MANAGEMENT: No pain  NARCOTICS INDUCED CONSTIPATION: None  LIVING WILL AND CODE STATUS: Full code.   INTERVAL HISTORY: Sylvia [name] 79 y.o. female returns to the clinic today for followup visit accompanied by 2 of her daughters. The patient is feeling fine today with no specific complaints except for mild fatigue. She also has cough especially when she swallows liquids and insomnia at times. She continues to have shortness of breath with exertion but no significant chest pain, cough or hemoptysis. The patient denied having any significant weight loss or night sweats. She has no nausea or vomiting. She has no fever or chills. She tolerated the last cycle of her chemotherapy was carboplatin and Alimta fairly well. The patient has repeat CT scan of the chest, abdomen and pelvis performed  recently and she is here for evaluation and discussion of her scan results.   MEDICAL HISTORY: Past Medical History  Diagnosis Date  . HTN (hypertension)   . Renal insufficiency   . IBS (irritable bowel syndrome)   . Anemia 06/2010  . Raynaud's syndrome   . PVD (peripheral vascular disease)   . Carotid stenosis   . Raynaud phenomenon since 1990's  . Hoarseness of voice   . PVD (peripheral vascular disease)   . History of radiation therapy 06/15/2012-07/05/2012    37.5 gray to right lung tumor    ALLERGIES:  has No Known Allergies.  MEDICATIONS:  Current Outpatient Prescriptions  Medication Sig Dispense Refill  . acetaminophen (TYLENOL) 325 MG tablet Take 650 mg by mouth 2 (two) times daily.      . Ascorbic Acid (VITAMIN C PO) Take 1 tablet by mouth daily.      Marland Kitchen BENICAR HCT 40-12.5 MG per tablet Take 1 tablet by mouth daily.       Marland Kitchen dexamethasone (DECADRON) 4 MG tablet 4 Milligram by mouth twice a day the day before, day of and day after the chemotherapy every 3 weeks  40 tablet  1  . felodipine (PLENDIL) 10 MG 24 hr tablet Take 10 mg by mouth daily.       Marland Kitchen FERROUS SULFATE PO Take 1 tablet by mouth daily.      . fish oil-omega-3 fatty acids 1000 MG capsule Take 1 g by mouth daily.      . folic acid (FOLVITE) 1 MG tablet Take 1 tablet (1 mg total) by mouth daily.  30 tablet  4  . Multiple Vitamin (MULTIVITAMIN WITH MINERALS) TABS Take 1 tablet by  mouth daily.      Marland Kitchen PRESCRIPTION MEDICATION Inject into the vein once. PEMEtrexed (ALIMTA) 550 mg in sodium chloride 0.9 % 100 mL chemo infusion 375 mg/m2  1.45 m2 (Treatment Plan Actual)  Once 08/21/2012      . prochlorperazine (COMPAZINE) 10 MG tablet Take 1 tablet (10 mg total) by mouth every 6 (six) hours as needed.  60 tablet  0  . Sennosides (SENOKOT PO) Take 1 tablet by mouth daily as needed (constipation).       Marland Kitchen VITAMIN D, CHOLECALCIFEROL, PO Take 1 tablet by mouth daily.       . Vitamin Mixture (VITAMIN E COMPLETE) CAPS Take 1  capsule by mouth daily.        No current facility-administered medications for this visit.    SURGICAL HISTORY:  Past Surgical History  Procedure Laterality Date  . Colonoscopy  02/2004 and 05/2011    last colonoscopy in April 2013 was negative by Dr. Laural Benes  . Hemorrhoid surgery    . Breast surgery      lumpectomy- right  . Hemorrhoid surgery    . Artery biopsy  12/30/2011    Procedure: MINOR BIOPSY TEMPORAL ARTERY;  Surgeon: Currie Paris, MD;  Location: [facility];  Service: General;  Laterality: Right;  temoral artery biopsy -right side    REVIEW OF SYSTEMS:  Constitutional: positive for fatigue Eyes: negative Ears, nose, mouth, throat, and face: negative Respiratory: positive for cough Cardiovascular: negative Gastrointestinal: negative Genitourinary:negative Integument/breast: negative Hematologic/lymphatic: negative Musculoskeletal:negative Neurological: negative Behavioral/Psych: positive for sleep disturbance Endocrine: negative Allergic/Immunologic: negative   PHYSICAL EXAMINATION: General appearance: alert, cooperative, fatigued and no distress Head: Normocephalic, without obvious abnormality, atraumatic Neck: no adenopathy, no JVD, supple, symmetrical, trachea midline and thyroid not enlarged, symmetric, no tenderness/mass/nodules Lymph nodes: Cervical, supraclavicular, and axillary nodes normal. Resp: clear to auscultation bilaterally and normal percussion bilaterally Back: symmetric, no curvature. ROM normal. No CVA tenderness. Cardio: regular rate and rhythm, S1, S2 normal, no murmur, click, rub or gallop GI: soft, non-tender; bowel sounds normal; no masses,  no organomegaly Extremities: extremities normal, atraumatic, no cyanosis or edema Neurologic: Alert and oriented X 3, normal strength and tone. Normal symmetric reflexes. Normal coordination and gait  ECOG PERFORMANCE STATUS: 1 - Symptomatic but completely ambulatory  Blood  pressure 139/84, pulse 69, temperature 97.5 F (36.4 C), temperature source Oral, resp. rate 20, height 5\' 3"  (1.6 m), weight 102 lb 3.2 oz (46.358 kg).  LABORATORY DATA: Lab Results  Component Value Date   WBC 5.8 11/13/2012   HGB 9.1* 11/13/2012   HCT 27.0* 11/13/2012   MCV 91.5 11/13/2012   PLT 317 11/13/2012      Chemistry      Component Value Date/Time   NA 132* 11/13/2012 0848   NA 128* 08/28/2012 2330   K 4.6 11/13/2012 0848   K 4.2 08/28/2012 2330   CL 93* 08/28/2012 2330   CL 98 08/07/2012 1134   CO2 26 11/13/2012 0848   CO2 26 08/28/2012 2330   BUN 29.2* 11/13/2012 0848   BUN 26* 08/28/2012 2330   CREATININE 0.9 11/13/2012 0848   CREATININE 0.98 08/28/2012 2330      Component Value Date/Time   CALCIUM 9.1 11/13/2012 0848   CALCIUM 9.2 08/28/2012 2330   ALKPHOS 77 11/13/2012 0848   ALKPHOS 70 06/07/2011 1007   AST 25 11/13/2012 0848   AST 21 06/07/2011 1007   ALT 13 11/13/2012 0848   ALT 12 06/07/2011 1007  BILITOT 0.27 11/13/2012 0848   BILITOT 0.4 06/07/2011 1007       RADIOGRAPHIC STUDIES: Ct Chest W Contrast  11/10/2012   CLINICAL DATA:  Metastatic non-small cell lung cancer, adenocarcinoma, negative EGFR mutation, negative ALK gene translocation, presented with right upper lobe lung mass as well as mediastinal lymphadenopathy and left lower lobe lesion diagnosed in April of 2014  EXAM: CT CHEST, ABDOMEN, AND PELVIS WITH CONTRAST  TECHNIQUE: Multidetector CT imaging of the chest, abdomen and pelvis was performed following the standard protocol during bolus administration of intravenous contrast.  CONTRAST:  80mL OMNIPAQUE IOHEXOL 300 MG/ML  SOLN  COMPARISON:  09/04/2012  FINDINGS:   CT CHEST FINDINGS  There is no axillary lymphadenopathy. The previously measured high right peritracheal lymph node is stable at 9 mm short axis. The 12 mm pretracheal lymph node measured on the previous study is now 13 mm on image 18. No subcarinal or left axillary lymphadenopathy. Heart size is at  upper normal. Trace pericardial effusion is stable. Coronary artery calcification is noted. Moderate right pleural effusion has progressed in the interval.  Right upper lobe pulmonary mass has not changed substantially in the interval, measuring 4.4 x 4.0 cm today compared to 4.3 x 3.9 cm previously. There has been interval development of airspace disease in the right upper lobe peripheral to the mass lesion, suggesting postobstructive consolidation. There is some similar airspace disease just posterior to the mass.  Emphysema is noted within the upper lungs bilaterally. The spiculated left lower lobe central opacity measures 1.1 x 1.9 cm today compared to 1.3 x 2.0 cm previously.  Bone windows reveal no worrisome lytic or sclerotic osseous lesions.    CT ABDOMEN AND PELVIS FINDINGS  No focal abnormality is seen in the liver or spleen. The stomach, duodenum, and left adrenal gland are unremarkable. 11 x 6 mm low-density lesion in the tail of the pancreas is noted. There is no dilatation of the main pancreatic duct. There is trace intrahepatic biliary duct dilatation with upper normal common duct size at 7 mm diameter.  1.3 x 2.1 cm right adrenal nodule is new in the interval.  Atherosclerotic calcification seen of the abdominal aorta without aneurysm. No abdominal lymphadenopathy. No free fluid in the abdomen.  Imaging through the pelvis shows no free intraperitoneal fluid. There is no pelvic sidewall lymphadenopathy. Pelvic floor laxity is evident. No colonic diverticulitis. Prominent stool volume suggests clinical constipation. The terminal ileum is not well seen. The appendix is unremarkable.  Degenerative changes are seen in the lower lumbar spine and bilateral hips.   IMPRESSION: CT CHEST IMPRESSION  No substantial interval change in size of the primary right upper lobe mass. The sub solid left lower lobe nodule is also unchanged.  No substantial interval change in mediastinal lymphadenopathy.  Slight  increase in moderate right pleural effusion.  CT ABDOMEN AND PELVIS IMPRESSION  1.3 x 2.1 cm necrotic right adrenal nodule, suspicious for metastatic disease.  11 x 6 mm low-density lesion in the tail of the pancreas. Coronal images suggest that this may communicate with the main pancreatic duct. Small IPMN would be a consideration. Attention on followup imaging is recommended.   Electronically Signed   By: Kennith Center M.D.   On: 11/10/2012 14:08   ASSESSMENT AND PLAN: This is a very pleasant 79 years old white female with metastatic non-small cell lung cancer, adenocarcinoma with negative EGFR mutation and negative ALK gene translocation status post 6 cycles of systemic chemotherapy with reduced dose  carboplatin and Alimta. Her recent scan showed stable disease except for small necrotic right adrenal nodule. I discussed the scan results with the patient and her family and showed them the images. I recommended for the patient to consider maintenance chemotherapy with single agent Alimta 375 mg/M2 every 3 weeks. She will have restaging scans after 3 cycles and if the patient has evidence for disease progression will consider her for second line chemotherapy or oral Tarceva. I discussed with the patient adverse effect of the chemotherapy including but not limited to mild alopecia, myelosuppression, nausea and vomiting, liver or renal dysfunction. The patient would like to proceed with treatment as planned. She is expected to start the first cycle of this treatment on 11/16/2012. She would come back for followup visit in 3 weeks with the start of cycle number 2. She was advised to call immediately if she has any concerning symptoms in the interval.  The patient voices understanding of current disease status and treatment options and is in agreement with the current care plan.  All questions were answered. The patient knows to call the clinic with any problems, questions or concerns. We can certainly see  the patient much sooner if necessary.  I spent 15 minutes counseling the patient face to face. The total time spent in the appointment was 25 minutes.

## 2012-11-13 NOTE — Telephone Encounter (Signed)
gv andprinted appt sched and avs for pt for OCT emailed MW to add tx...  °

## 2012-11-13 NOTE — Patient Instructions (Addendum)
CURRENT THERAPY: Maintenance chemotherapy with single agent Alimta 375mg /M2 every 3 weeks. First cycle expected on 11/16/2012  CHEMOTHERAPY INTENT: palliative/maintenance CURRENT # OF CHEMOTHERAPY CYCLES: 1 CURRENT ANTIEMETICS: Zofran, dexamethasone and Compazine  CURRENT SMOKING STATUS: Nonsmoker  ORAL CHEMOTHERAPY AND CONSENT: None  CURRENT BISPHOSPHONATES USE: None  PAIN MANAGEMENT: No pain  NARCOTICS INDUCED CONSTIPATION: None  LIVING WILL AND CODE STATUS: Full code.

## 2012-11-16 ENCOUNTER — Ambulatory Visit (HOSPITAL_BASED_OUTPATIENT_CLINIC_OR_DEPARTMENT_OTHER): Payer: Medicare Other

## 2012-11-16 ENCOUNTER — Other Ambulatory Visit (HOSPITAL_BASED_OUTPATIENT_CLINIC_OR_DEPARTMENT_OTHER): Payer: Medicare Other | Admitting: Lab

## 2012-11-16 DIAGNOSIS — C3410 Malignant neoplasm of upper lobe, unspecified bronchus or lung: Secondary | ICD-10-CM

## 2012-11-16 DIAGNOSIS — Z5111 Encounter for antineoplastic chemotherapy: Secondary | ICD-10-CM

## 2012-11-16 DIAGNOSIS — C3491 Malignant neoplasm of unspecified part of right bronchus or lung: Secondary | ICD-10-CM

## 2012-11-16 LAB — COMPREHENSIVE METABOLIC PANEL (CC13)
ALT: 11 U/L (ref 0–55)
AST: 26 U/L (ref 5–34)
Albumin: 3 g/dL — ABNORMAL LOW (ref 3.5–5.0)
BUN: 31.5 mg/dL — ABNORMAL HIGH (ref 7.0–26.0)
CO2: 25 mEq/L (ref 22–29)
Calcium: 9.4 mg/dL (ref 8.4–10.4)
Chloride: 98 mEq/L (ref 98–109)
Creatinine: 0.9 mg/dL (ref 0.6–1.1)
Potassium: 4.8 mEq/L (ref 3.5–5.1)
Total Protein: 7.1 g/dL (ref 6.4–8.3)

## 2012-11-16 LAB — CBC WITH DIFFERENTIAL/PLATELET
Basophils Absolute: 0 10*3/uL (ref 0.0–0.1)
Eosinophils Absolute: 0.1 10*3/uL (ref 0.0–0.5)
HCT: 26.7 % — ABNORMAL LOW (ref 34.8–46.6)
HGB: 8.9 g/dL — ABNORMAL LOW (ref 11.6–15.9)
LYMPH%: 14 % (ref 14.0–49.7)
MONO#: 1.3 10*3/uL — ABNORMAL HIGH (ref 0.1–0.9)
NEUT#: 4.1 10*3/uL (ref 1.5–6.5)
NEUT%: 63.4 % (ref 38.4–76.8)
Platelets: 328 10*3/uL (ref 145–400)
WBC: 6.4 10*3/uL (ref 3.9–10.3)
lymph#: 0.9 10*3/uL (ref 0.9–3.3)
nRBC: 1 % — ABNORMAL HIGH (ref 0–0)

## 2012-11-16 MED ORDER — SODIUM CHLORIDE 0.9 % IJ SOLN
10.0000 mL | INTRAMUSCULAR | Status: DC | PRN
  Filled 2012-11-16: qty 10

## 2012-11-16 MED ORDER — DEXAMETHASONE SODIUM PHOSPHATE 10 MG/ML IJ SOLN
10.0000 mg | Freq: Once | INTRAMUSCULAR | Status: AC
  Administered 2012-11-16: 10 mg via INTRAVENOUS

## 2012-11-16 MED ORDER — ONDANSETRON 8 MG/NS 50 ML IVPB
INTRAVENOUS | Status: AC
  Filled 2012-11-16: qty 8

## 2012-11-16 MED ORDER — DEXAMETHASONE SODIUM PHOSPHATE 10 MG/ML IJ SOLN
INTRAMUSCULAR | Status: AC
  Filled 2012-11-16: qty 1

## 2012-11-16 MED ORDER — SODIUM CHLORIDE 0.9 % IV SOLN
375.0000 mg/m2 | Freq: Once | INTRAVENOUS | Status: AC
  Administered 2012-11-16: 550 mg via INTRAVENOUS
  Filled 2012-11-16: qty 22

## 2012-11-16 MED ORDER — SODIUM CHLORIDE 0.9 % IV SOLN
Freq: Once | INTRAVENOUS | Status: AC
  Administered 2012-11-16: 10:00:00 via INTRAVENOUS

## 2012-11-16 MED ORDER — ONDANSETRON 8 MG/50ML IVPB (CHCC)
8.0000 mg | Freq: Once | INTRAVENOUS | Status: AC
  Administered 2012-11-16: 8 mg via INTRAVENOUS

## 2012-11-16 MED ORDER — HEPARIN SOD (PORK) LOCK FLUSH 100 UNIT/ML IV SOLN
500.0000 [IU] | Freq: Once | INTRAVENOUS | Status: DC | PRN
  Filled 2012-11-16: qty 5

## 2012-11-16 NOTE — Patient Instructions (Addendum)
[facility] Discharge Instructions for Patients Receiving Chemotherapy  Today you received the following chemotherapy agents:  Alimta  To help prevent nausea and vomiting after your treatment, we encourage you to take your nausea medication as ordered per MD.   If you develop nausea and vomiting that is not controlled by your nausea medication, call the clinic.   BELOW ARE SYMPTOMS THAT SHOULD BE REPORTED IMMEDIATELY:  *FEVER GREATER THAN 100.5 F  *CHILLS WITH OR WITHOUT FEVER  NAUSEA AND VOMITING THAT IS NOT CONTROLLED WITH YOUR NAUSEA MEDICATION  *UNUSUAL SHORTNESS OF BREATH  *UNUSUAL BRUISING OR BLEEDING  TENDERNESS IN MOUTH AND THROAT WITH OR WITHOUT PRESENCE OF ULCERS  *URINARY PROBLEMS  *BOWEL PROBLEMS  UNUSUAL RASH Items with * indicate a potential emergency and should be followed up as soon as possible.  Feel free to call the clinic you have any questions or concerns. The clinic phone number is [phone].    

## 2012-11-21 ENCOUNTER — Telehealth: Payer: Self-pay | Admitting: *Deleted

## 2012-11-21 NOTE — Telephone Encounter (Signed)
Pt called and I returned phone call.  Left message to call

## 2012-11-23 ENCOUNTER — Encounter (HOSPITAL_COMMUNITY): Payer: Self-pay | Admitting: Emergency Medicine

## 2012-11-23 ENCOUNTER — Other Ambulatory Visit: Payer: Self-pay | Admitting: *Deleted

## 2012-11-23 ENCOUNTER — Emergency Department (HOSPITAL_COMMUNITY): Payer: Medicare Other

## 2012-11-23 ENCOUNTER — Telehealth: Payer: Self-pay | Admitting: *Deleted

## 2012-11-23 ENCOUNTER — Inpatient Hospital Stay (HOSPITAL_COMMUNITY)
Admission: EM | Admit: 2012-11-23 | Discharge: 2012-11-26 | DRG: 811 | Disposition: A | Discharge status: Home / Self Care | Payer: Medicare Other | Attending: Internal Medicine | Admitting: Internal Medicine

## 2012-11-23 DIAGNOSIS — C3410 Malignant neoplasm of upper lobe, unspecified bronchus or lung: Secondary | ICD-10-CM

## 2012-11-23 DIAGNOSIS — R040 Epistaxis: Secondary | ICD-10-CM | POA: Diagnosis present

## 2012-11-23 DIAGNOSIS — D61818 Other pancytopenia: Secondary | ICD-10-CM | POA: Diagnosis present

## 2012-11-23 DIAGNOSIS — D6481 Anemia due to antineoplastic chemotherapy: Principal | ICD-10-CM | POA: Diagnosis present

## 2012-11-23 DIAGNOSIS — T451X5A Adverse effect of antineoplastic and immunosuppressive drugs, initial encounter: Secondary | ICD-10-CM | POA: Diagnosis present

## 2012-11-23 DIAGNOSIS — E871 Hypo-osmolality and hyponatremia: Secondary | ICD-10-CM | POA: Diagnosis present

## 2012-11-23 DIAGNOSIS — Z87891 Personal history of nicotine dependence: Secondary | ICD-10-CM

## 2012-11-23 DIAGNOSIS — Z681 Body mass index (BMI) 19 or less, adult: Secondary | ICD-10-CM

## 2012-11-23 DIAGNOSIS — E43 Unspecified severe protein-calorie malnutrition: Secondary | ICD-10-CM | POA: Diagnosis present

## 2012-11-23 DIAGNOSIS — I10 Essential (primary) hypertension: Secondary | ICD-10-CM | POA: Diagnosis present

## 2012-11-23 DIAGNOSIS — D702 Other drug-induced agranulocytosis: Secondary | ICD-10-CM

## 2012-11-23 DIAGNOSIS — C3490 Malignant neoplasm of unspecified part of unspecified bronchus or lung: Secondary | ICD-10-CM | POA: Diagnosis present

## 2012-11-23 DIAGNOSIS — M1990 Unspecified osteoarthritis, unspecified site: Secondary | ICD-10-CM | POA: Insufficient documentation

## 2012-11-23 DIAGNOSIS — D72819 Decreased white blood cell count, unspecified: Secondary | ICD-10-CM | POA: Diagnosis present

## 2012-11-23 DIAGNOSIS — K589 Irritable bowel syndrome without diarrhea: Secondary | ICD-10-CM | POA: Diagnosis present

## 2012-11-23 DIAGNOSIS — R0902 Hypoxemia: Secondary | ICD-10-CM | POA: Diagnosis present

## 2012-11-23 DIAGNOSIS — D649 Anemia, unspecified: Secondary | ICD-10-CM | POA: Diagnosis present

## 2012-11-23 DIAGNOSIS — Z79899 Other long term (current) drug therapy: Secondary | ICD-10-CM

## 2012-11-23 DIAGNOSIS — I7300 Raynaud's syndrome without gangrene: Secondary | ICD-10-CM | POA: Diagnosis present

## 2012-11-23 DIAGNOSIS — G4730 Sleep apnea, unspecified: Secondary | ICD-10-CM | POA: Diagnosis present

## 2012-11-23 DIAGNOSIS — Z923 Personal history of irradiation: Secondary | ICD-10-CM

## 2012-11-23 LAB — COMPREHENSIVE METABOLIC PANEL
ALT: 11 U/L (ref 0–35)
AST: 22 U/L (ref 0–37)
Albumin: 2.7 g/dL — ABNORMAL LOW (ref 3.5–5.2)
Alkaline Phosphatase: 68 U/L (ref 39–117)
CO2: 25 mEq/L (ref 19–32)
Calcium: 8.5 mg/dL (ref 8.4–10.5)
GFR calc Af Amer: 56 mL/min — ABNORMAL LOW (ref >90)
GFR calc non Af Amer: 49 mL/min — ABNORMAL LOW (ref >90)
Potassium: 4.3 mEq/L (ref 3.5–5.1)
Sodium: 127 mEq/L — ABNORMAL LOW (ref 135–145)
Total Bilirubin: 0.4 mg/dL (ref 0.3–1.2)
Total Protein: 6.4 g/dL (ref 6.0–8.3)

## 2012-11-23 LAB — CBC WITH DIFFERENTIAL/PLATELET
Basophils Absolute: 0 10*3/uL (ref 0.0–0.1)
Eosinophils Absolute: 0 10*3/uL (ref 0.0–0.7)
HCT: 17.3 % — ABNORMAL LOW (ref 36.0–46.0)
Hemoglobin: 6 g/dL — CL (ref 12.0–15.0)
Lymphocytes Relative: 15 % (ref 12–46)
Lymphs Abs: 0.2 10*3/uL — ABNORMAL LOW (ref 0.7–4.0)
MCH: 31.1 pg (ref 26.0–34.0)
MCHC: 34.7 g/dL (ref 30.0–36.0)
Monocytes Absolute: 0.2 10*3/uL (ref 0.1–1.0)
Monocytes Relative: 13 % — ABNORMAL HIGH (ref 3–12)
Neutro Abs: 1.1 10*3/uL — ABNORMAL LOW (ref 1.7–7.7)
Neutrophils Relative %: 71 % (ref 43–77)
Platelets: 185 10*3/uL (ref 150–400)
RDW: 18.6 % — ABNORMAL HIGH (ref 11.5–15.5)
WBC: 1.5 10*3/uL — ABNORMAL LOW (ref 4.0–10.5)

## 2012-11-23 LAB — CG4 I-STAT (LACTIC ACID): Lactic Acid, Venous: 0.56 mmol/L (ref 0.5–2.2)

## 2012-11-23 MED ORDER — ALUM & MAG HYDROXIDE-SIMETH 200-200-20 MG/5ML PO SUSP
15.0000 mL | Freq: Four times a day (QID) | ORAL | Status: DC | PRN
  Administered 2012-11-24: 15 mL via ORAL
  Filled 2012-11-23: qty 30

## 2012-11-23 MED ORDER — PROCHLORPERAZINE MALEATE 10 MG PO TABS: 10.0000 mg | ORAL_TABLET | Freq: Four times a day (QID) | ORAL | Status: DC | PRN

## 2012-11-23 MED ORDER — ONDANSETRON HCL 4 MG PO TABS: 4.0000 mg | ORAL_TABLET | Freq: Four times a day (QID) | ORAL | Status: DC | PRN

## 2012-11-23 MED ORDER — SODIUM CHLORIDE 0.9 % IJ SOLN: 3.0000 mL | INTRAMUSCULAR | Status: DC | PRN

## 2012-11-23 MED ORDER — OLMESARTAN MEDOXOMIL-HCTZ 40-12.5 MG PO TABS: 1.0000 | ORAL_TABLET | Freq: Every day | ORAL | Status: DC

## 2012-11-23 MED ORDER — FOLIC ACID 1 MG PO TABS
1.0000 mg | ORAL_TABLET | Freq: Every day | ORAL | Status: DC
  Administered 2012-11-24 – 2012-11-26 (×3): 1 mg via ORAL
  Filled 2012-11-23 (×3): qty 1

## 2012-11-23 MED ORDER — IRBESARTAN 300 MG PO TABS
300.0000 mg | ORAL_TABLET | Freq: Every day | ORAL | Status: DC
  Administered 2012-11-24 – 2012-11-26 (×3): 300 mg via ORAL
  Filled 2012-11-23 (×3): qty 1

## 2012-11-23 MED ORDER — ONDANSETRON HCL 4 MG/2ML IJ SOLN: 4.0000 mg | Freq: Four times a day (QID) | INTRAMUSCULAR | Status: DC | PRN

## 2012-11-23 MED ORDER — ACETAMINOPHEN 325 MG PO TABS
650.0000 mg | ORAL_TABLET | Freq: Two times a day (BID) | ORAL | Status: DC
  Administered 2012-11-24 – 2012-11-25 (×3): 650 mg via ORAL
  Filled 2012-11-23 (×6): qty 2

## 2012-11-23 MED ORDER — OXYCODONE HCL 5 MG PO TABS: 5.0000 mg | ORAL_TABLET | ORAL | Status: DC | PRN

## 2012-11-23 MED ORDER — SODIUM CHLORIDE 0.9 % IV BOLUS (SEPSIS)
500.0000 mL | Freq: Once | INTRAVENOUS | Status: AC
  Administered 2012-11-23: 500 mL via INTRAVENOUS

## 2012-11-23 MED ORDER — FELODIPINE ER 10 MG PO TB24
10.0000 mg | ORAL_TABLET | Freq: Every day | ORAL | Status: DC
  Administered 2012-11-24 – 2012-11-26 (×3): 10 mg via ORAL
  Filled 2012-11-23 (×3): qty 1

## 2012-11-23 MED ORDER — HYDROCHLOROTHIAZIDE 12.5 MG PO CAPS
12.5000 mg | ORAL_CAPSULE | Freq: Every day | ORAL | Status: DC
  Administered 2012-11-24 – 2012-11-26 (×3): 12.5 mg via ORAL
  Filled 2012-11-23 (×3): qty 1

## 2012-11-23 MED ORDER — SODIUM CHLORIDE 0.9 % IJ SOLN
3.0000 mL | Freq: Two times a day (BID) | INTRAMUSCULAR | Status: DC
  Administered 2012-11-23 – 2012-11-26 (×5): 3 mL via INTRAVENOUS

## 2012-11-23 MED ORDER — HEPARIN SODIUM (PORCINE) 5000 UNIT/ML IJ SOLN
5000.0000 [IU] | Freq: Three times a day (TID) | INTRAMUSCULAR | Status: DC
  Administered 2012-11-24 – 2012-11-26 (×7): 5000 [IU] via SUBCUTANEOUS
  Filled 2012-11-23 (×11): qty 1

## 2012-11-23 MED ORDER — ADULT MULTIVITAMIN W/MINERALS CH
1.0000 | ORAL_TABLET | Freq: Every day | ORAL | Status: DC
  Administered 2012-11-24 – 2012-11-26 (×3): 1 via ORAL
  Filled 2012-11-23 (×3): qty 1

## 2012-11-23 MED ORDER — SODIUM CHLORIDE 0.9 % IV SOLN: 250.0000 mL | INTRAVENOUS | Status: DC | PRN

## 2012-11-23 MED ORDER — POLYETHYLENE GLYCOL 3350 17 G PO PACK
17.0000 g | PACK | Freq: Every day | ORAL | Status: DC
  Filled 2012-11-23 (×3): qty 1

## 2012-11-23 NOTE — ED Notes (Signed)
MD at bedside. 

## 2012-11-23 NOTE — ED Provider Notes (Addendum)
CSN: [id number]     Arrival date & time 11/23/12  1731 History   First MD Initiated Contact with Patient 11/23/12 1741     Chief Complaint  Patient presents with  . Weakness   (Consider location/radiation/quality/duration/timing/severity/associated sxs/prior Treatment) Patient is a 79 y.o. female presenting with weakness. The history is provided by the patient.  Weakness This is a recurrent problem. Episode onset: this afternoon. The problem occurs constantly. The problem has not changed since onset.Pertinent negatives include no chest pain, no abdominal pain and no shortness of breath. Associated symptoms comments: States that she felt very tired after getting her hair done today and globally weak.  She denies SOB, cough, fever, abd pain, vomiting, diarrhea.  Last chemo for her lung CA was 2 weeks ago.  Found to be hypoxic here to 85%. Exacerbated by: activity. The symptoms are relieved by sleep and rest. She has tried nothing for the symptoms. The treatment provided no relief.    Past Medical History  Diagnosis Date  . HTN (hypertension)   . Renal insufficiency   . IBS (irritable bowel syndrome)   . Anemia 06/2010  . Raynaud's syndrome   . PVD (peripheral vascular disease)   . Carotid stenosis   . Raynaud phenomenon since 1990's  . Hoarseness of voice   . PVD (peripheral vascular disease)   . History of radiation therapy 06/15/2012-07/05/2012    37.5 gray to right lung tumor   Past Surgical History  Procedure Laterality Date  . Colonoscopy  02/2004 and 05/2011    last colonoscopy in April 2013 was negative by Dr. Laural Benes  . Hemorrhoid surgery    . Breast surgery      lumpectomy- right  . Hemorrhoid surgery    . Artery biopsy  12/30/2011    Procedure: MINOR BIOPSY TEMPORAL ARTERY;  Surgeon: Currie Paris, MD;  Location: [facility];  Service: General;  Laterality: Right;  temoral artery biopsy -right side   Family History  Problem Relation Age of Onset  .  Pneumonia Mother   . Stroke Father    History  Substance Use Topics  . Smoking status: Former Smoker -- 0.25 packs/day for 60 years    Quit date: 02/15/2009  . Smokeless tobacco: Not on file  . Alcohol Use: No   OB History   Grav Para Term Preterm Abortions TAB SAB Ect Mult Living                 Review of Systems  Constitutional: Negative for fever, chills and diaphoresis.       Has not had much of an appetite today  Respiratory: Negative for cough and shortness of breath.   Cardiovascular: Positive for leg swelling. Negative for chest pain.       Chronic ankle swelling bilaterally for the last 3-4 months  Gastrointestinal: Negative for nausea, vomiting, abdominal pain and diarrhea.  Neurological: Positive for weakness.  All other systems reviewed and are negative.    Allergies  Review of patient's allergies indicates no known allergies.  Home Medications   Current Outpatient Rx  Name  Route  Sig  Dispense  Refill  . acetaminophen (TYLENOL) 325 MG tablet   Oral   Take 650 mg by mouth 2 (two) times daily.         Marland Kitchen alum & mag hydroxide-simeth (MAALOX/MYLANTA) 200-200-20 MG/5ML suspension   Oral   Take by mouth every 6 (six) hours as needed for indigestion.         Marland Kitchen  Ascorbic Acid (VITAMIN C PO)   Oral   Take 1 tablet by mouth daily.         Marland Kitchen BENICAR HCT 40-12.5 MG per tablet   Oral   Take 1 tablet by mouth daily.          Marland Kitchen dexamethasone (DECADRON) 4 MG tablet      4 Milligram by mouth twice a day the day before, day of and day after the chemotherapy every 3 weeks   40 tablet   1   . felodipine (PLENDIL) 10 MG 24 hr tablet   Oral   Take 10 mg by mouth daily.          Marland Kitchen FERROUS SULFATE PO   Oral   Take 1 tablet by mouth daily.         . fish oil-omega-3 fatty acids 1000 MG capsule   Oral   Take 1 g by mouth daily.         . folic acid (FOLVITE) 1 MG tablet   Oral   Take 1 tablet (1 mg total) by mouth daily.   30 tablet   4   .  Multiple Vitamin (MULTIVITAMIN WITH MINERALS) TABS   Oral   Take 1 tablet by mouth daily.         . polyethylene glycol (MIRALAX / GLYCOLAX) packet   Oral   Take 17 g by mouth daily.         Marland Kitchen PRESCRIPTION MEDICATION   Intravenous   Inject into the vein once. PEMEtrexed (ALIMTA) 550 mg in sodium chloride 0.9 % 100 mL chemo infusion 375 mg/m2  1.45 m2 (Treatment Plan Actual)  Once 08/21/2012         . prochlorperazine (COMPAZINE) 10 MG tablet   Oral   Take 1 tablet (10 mg total) by mouth every 6 (six) hours as needed.   60 tablet   0   . VITAMIN D, CHOLECALCIFEROL, PO   Oral   Take 1 tablet by mouth daily.          . Vitamin Mixture (VITAMIN E COMPLETE) CAPS   Oral   Take 1 capsule by mouth daily.           BP 123/51  Pulse 73  Temp(Src) 98.9 F (37.2 C) (Oral)  Resp 18  Ht 5\' 3"  (1.6 m)  Wt 100 lb (45.36 kg)  BMI 17.72 kg/m2  SpO2 92% Physical Exam  Nursing note and vitals reviewed. Constitutional: She is oriented to person, place, and time. She appears well-developed and well-nourished. No distress.  HENT:  Head: Normocephalic and atraumatic.  Mouth/Throat: Oropharynx is clear and moist.  Eyes: Conjunctivae and EOM are normal. Pupils are equal, round, and reactive to light.  Neck: Normal range of motion. Neck supple.  Cardiovascular: Normal rate, regular rhythm and intact distal pulses.   Murmur heard.  Systolic murmur is present with a grade of 3/6  Pulmonary/Chest: Effort normal. No respiratory distress. She has no wheezes. She has rhonchi in the right upper field. She has no rales.  Abdominal: Soft. She exhibits no distension. There is no tenderness. There is no rebound and no guarding.  Musculoskeletal: Normal range of motion. She exhibits no edema and no tenderness.  Neurological: She is alert and oriented to person, place, and time.  Skin: Skin is warm and dry. No rash noted. No erythema.  Psychiatric: She has a normal mood and affect. Her behavior  is normal.    ED Course  Procedures (including critical care time) Labs Review Labs Reviewed  CBC WITH DIFFERENTIAL - Abnormal; Notable for the following:    WBC 1.5 (*)    RBC 1.93 (*)    Hemoglobin 6.0 (*)    HCT 17.3 (*)    RDW 18.6 (*)    Monocytes Relative 13 (*)    Neutro Abs 1.1 (*)    Lymphs Abs 0.2 (*)    All other components within normal limits  COMPREHENSIVE METABOLIC PANEL - Abnormal; Notable for the following:    Sodium 127 (*)    Chloride 91 (*)    Glucose, Bld 109 (*)    BUN 36 (*)    Albumin 2.7 (*)    GFR calc non Af Amer 49 (*)    GFR calc Af Amer 56 (*)    All other components within normal limits  CG4 I-STAT (LACTIC ACID)  TYPE AND SCREEN  PREPARE RBC (CROSSMATCH)   Imaging Review Dg Chest 2 View  11/23/2012   CLINICAL DATA:  Hypoxia.  Lung cancer.  EXAM: CHEST  2 VIEW  COMPARISON:  11/10/2012.  FINDINGS: Right apical mass demonstrated on recent prior CT. Moderate right pleural effusion. Cardiopericardial silhouette mildly enlarged. Left basilar atelectasis and/ or scarring. Aortic arch atherosclerosis. There is no pulmonary edema. No definite airspace disease.  IMPRESSION: Right apical mass and likely malignant right pleural effusion appear unchanged allowing for technical differences compared to the recent CT 11/10/2012. No interval acute abnormality.   Electronically Signed   By: Andreas Newport M.D.   On: 11/23/2012 18:52     Date: 11/23/2012  Rate: 73  Rhythm: normal sinus rhythm  QRS Axis: normal  Intervals: normal  ST/T Wave abnormalities: normal  Conduction Disutrbances:none  Narrative Interpretation:   Old EKG Reviewed: unchanged  CRITICAL CARE Performed by: Gwyneth Sprout Total critical care time: 30 Critical care time was exclusive of separately billable procedures and treating other patients. Critical care was necessary to treat or prevent imminent or life-threatening deterioration. Critical care was time spent personally by me on  the following activities: development of treatment plan with patient and/or surrogate as well as nursing, discussions with consultants, evaluation of patient's response to treatment, examination of patient, obtaining history from patient or surrogate, ordering and performing treatments and interventions, ordering and review of laboratory studies, ordering and review of radiographic studies, pulse oximetry and re-evaluation of patient's condition.   MDM   1. Anemia     Patient presenting from home with complaints of generalized weakness and feeling tired. Patient is currently undergoing chemotherapy for lung cancer in states that today she has not eaten lunch but otherwise she just feels tired from the chemotherapy. She denies any infectious symptoms and denies any productive cough, abdominal pain, vomiting, diarrhea. She denies any shortness of breath however she was having 85% on room air upon arrival. Patient does not wear oxygen at home and is not aware of any hypoxic events in the past. On exam she has a significant systolic murmur and rhonchi in the right upper lobe.  Patient has no prior history of murmur and none documented in chart.  Pt also has hx of anemia which may be the cause of this.  Also could be chemo related causing fatigue, electrolyte abnormality or cancer related.  7:35 PM Pt found to be anemic with new hb of 6 from baseline of 8-9 and neutropenic at 1000.  Blood transfusion started.  Pt remains stable and will admit.  Gwyneth Sprout, MD 11/23/12  [address]  Gwyneth Sprout, MD 11/23/12 [id number]  Gwyneth Sprout, MD 11/23/12 2015

## 2012-11-23 NOTE — Telephone Encounter (Signed)
Called and spoke with pt to clarify appt.  She got confused on when she was going out of town.  I will put in POF to keep 10/23 appt

## 2012-11-23 NOTE — Telephone Encounter (Signed)
Pt called with questions today.  She is having some leg pain and fells it is arthritis.  She stated she would like a cortisone shot "like last time".  Apparently, this happened many years ago and she does not remember the orthopedist.  I spoke with Dr. Arbutus Ped and he request pt have ortho consult.  Pt is also going out of town on 10/23 and will miss infusion appt.  I again spoke with Dr. Arbutus Ped and stated it is ok to re-schedule for next week.  I will place POF for these request.

## 2012-11-23 NOTE — ED Notes (Signed)
Dr. Anitra Lauth made aware of critical Hgb of 6.0

## 2012-11-23 NOTE — Telephone Encounter (Signed)
Pt daughter called.  She was concerned about pt leg pain.  I informed her that pt is being referred to orthopedist.  She verbalized understanding.

## 2012-11-23 NOTE — H&P (Signed)
[facility] History and Physical  Sylvia [name] ZOX:[id number] DOB: [birth year] DOA: 11/23/2012  Referring physician: Dr. Anitra Lauth PCP: Lupita Raider, MD  Specialists: none  Chief Complaint: Generalized Weakness  HPI: Sylvia [name] is a 79 y.o. female  Metastatic non-small cell lung cancer, adenocarcinoma on chemotherapy last week, that comes in generalized weakness that has progressively gotten worse to the point she can even go to the bathroom without getting short of breath. Found to be hypoxic here to 85%. Exacerbated by: activity. The symptoms are relieved by sleep and rest.  She relates no dysuria, cough and CP.   Review of Systems: The patient denies anorexia, fever, weight loss,, vision loss, decreased hearing, hoarseness, chest pain, syncope, peripheral edema, balance deficits, hemoptysis, abdominal pain, melena, hematochezia, severe indigestion/heartburn, hematuria, incontinence, genital sores, muscle weakness, suspicious skin lesions, transient blindness, difficulty walking, depression, unusual weight change, abnormal bleeding, enlarged lymph nodes, angioedema, and breast masses.    Past Medical History  Diagnosis Date  . HTN (hypertension)   . Renal insufficiency   . IBS (irritable bowel syndrome)   . Anemia 06/2010  . Raynaud's syndrome   . PVD (peripheral vascular disease)   . Carotid stenosis   . Raynaud phenomenon since 1990's  . Hoarseness of voice   . PVD (peripheral vascular disease)   . History of radiation therapy 06/15/2012-07/05/2012    37.5 gray to right lung tumor   Past Surgical History  Procedure Laterality Date  . Colonoscopy  02/2004 and 05/2011    last colonoscopy in April 2013 was negative by Dr. Laural Benes  . Hemorrhoid surgery    . Breast surgery      lumpectomy- right  . Hemorrhoid surgery    . Artery biopsy  12/30/2011    Procedure: MINOR BIOPSY TEMPORAL ARTERY;  Surgeon: Currie Paris, MD;  Location: [facility];  Service:  General;  Laterality: Right;  temoral artery biopsy -right side   Social History:  reports that she quit smoking about 3 years ago. She does not have any smokeless tobacco history on file. She reports that she does not drink alcohol or use illicit drugs. Lives at home alone.  No Known Allergies  Family History  Problem Relation Age of Onset  . Pneumonia Mother   . Stroke Father     Prior to Admission medications   Medication Sig Start Date End Date Taking? Authorizing Provider  acetaminophen (TYLENOL) 325 MG tablet Take 650 mg by mouth 2 (two) times daily.   Yes Historical Provider, MD  alum & mag hydroxide-simeth (MAALOX/MYLANTA) 200-200-20 MG/5ML suspension Take by mouth every 6 (six) hours as needed for indigestion.   Yes Historical Provider, MD  Ascorbic Acid (VITAMIN C PO) Take 1 tablet by mouth daily.   Yes Historical Provider, MD  BENICAR HCT 40-12.5 MG per tablet Take 1 tablet by mouth daily.  06/02/11  Yes Historical Provider, MD  dexamethasone (DECADRON) 4 MG tablet 4 Milligram by mouth twice a day the day before, day of and day after the chemotherapy every 3 weeks 06/22/12  Yes Si Gaul, MD  felodipine (PLENDIL) 10 MG 24 hr tablet Take 10 mg by mouth daily.  05/31/11  Yes Historical Provider, MD  FERROUS SULFATE PO Take 1 tablet by mouth daily.   Yes Historical Provider, MD  fish oil-omega-3 fatty acids 1000 MG capsule Take 1 g by mouth daily.   Yes Historical Provider, MD  folic acid (FOLVITE) 1 MG tablet Take 1 tablet (1 mg total) by mouth  daily. 06/22/12  Yes Si Gaul, MD  Multiple Vitamin (MULTIVITAMIN WITH MINERALS) TABS Take 1 tablet by mouth daily.   Yes Historical Provider, MD  polyethylene glycol (MIRALAX / GLYCOLAX) packet Take 17 g by mouth daily.   Yes Historical Provider, MD  PRESCRIPTION MEDICATION Inject into the vein once. PEMEtrexed (ALIMTA) 550 mg in sodium chloride 0.9 % 100 mL chemo infusion 375 mg/m2  1.45 m2 (Treatment Plan Actual)  Once 08/21/2012    Yes Historical Provider, MD  prochlorperazine (COMPAZINE) 10 MG tablet Take 1 tablet (10 mg total) by mouth every 6 (six) hours as needed. 11/07/12  Yes Si Gaul, MD  VITAMIN D, CHOLECALCIFEROL, PO Take 1 tablet by mouth daily.    Yes Historical Provider, MD  Vitamin Mixture (VITAMIN E COMPLETE) CAPS Take 1 capsule by mouth daily.    Yes Historical Provider, MD   Physical Exam: Filed Vitals:   11/23/12 1928  BP: 129/41  Pulse:   Temp:   Resp: 18   BP 129/41  Pulse 73  Temp(Src) 98.9 F (37.2 C) (Oral)  Resp 18  Ht 5\' 3"  (1.6 m)  Wt 45.36 kg (100 lb)  BMI 17.72 kg/m2  SpO2 92%  General Appearance:    Alert, cooperative, no distress, pale.              Throat:   Lips, mucosa, and tongue dry  Neck:   Supple, symmetrical, trachea midline, no adenopathy;    thyroid:  noJVD     Lungs:     Clear to auscultation bilaterally, respirations unlabored      Heart:    Regular rate and rhythm, S1 and S2 normal, no murmur, rub   or gallop     Abdomen:     Soft, non-tender, bowel sounds active all four quadrants,    no masses, no organomegaly        Extremities:   Extremities normal, atraumatic, no cyanosis or edema  Pulses:   2+ and symmetric all extremities     Lymph nodes:   Cervical, supraclavicular, and axillary nodes normal  Neurologic:   CNII-XII intact, normal strength, sensation and reflexes    throughout     Labs on Admission:  Basic Metabolic Panel:  Recent Labs Lab 11/23/12 1822  NA 127*  K 4.3  CL 91*  CO2 25  GLUCOSE 109*  BUN 36*  CREATININE 1.03  CALCIUM 8.5   Liver Function Tests:  Recent Labs Lab 11/23/12 1822  AST 22  ALT 11  ALKPHOS 68  BILITOT 0.4  PROT 6.4  ALBUMIN 2.7*   No results found for this basename: LIPASE, AMYLASE,  in the last 168 hours No results found for this basename: AMMONIA,  in the last 168 hours CBC:  Recent Labs Lab 11/23/12 1822  WBC 1.5*  NEUTROABS 1.1*  HGB 6.0*  HCT 17.3*  MCV 89.6  PLT 185    Cardiac Enzymes: No results found for this basename: CKTOTAL, CKMB, CKMBINDEX, TROPONINI,  in the last 168 hours  BNP (last 3 results) No results found for this basename: PROBNP,  in the last 8760 hours CBG: No results found for this basename: GLUCAP,  in the last 168 hours  Radiological Exams on Admission: Dg Chest 2 View  11/23/2012   CLINICAL DATA:  Hypoxia.  Lung cancer.  EXAM: CHEST  2 VIEW  COMPARISON:  11/10/2012.  FINDINGS: Right apical mass demonstrated on recent prior CT. Moderate right pleural effusion. Cardiopericardial silhouette mildly enlarged. Left basilar atelectasis  and/ or scarring. Aortic arch atherosclerosis. There is no pulmonary edema. No definite airspace disease.  IMPRESSION: Right apical mass and likely malignant right pleural effusion appear unchanged allowing for technical differences compared to the recent CT 11/10/2012. No interval acute abnormality.   Electronically Signed   By: Andreas Newport M.D.   On: 11/23/2012 18:52    EKG: Independently reviewed. Normal sinus rhythm no T wave abnormalities  Assessment/Plan Normocytic anemia: - Baseline Hbg around 10.1. - Now 6.0, Will transfuse 2 unit of PRBC. Check a CBC post-transfusion. - This is most likely due to chemotherapy.  HTN (hypertension): - cont home medications.  Lung cancer: - follow up with Dr. Shirline Frees as an outpatient.  Leukopenia - due to chemotherapy.  Code Status: full Family Communication: daughter Disposition Plan: inaptient  Time spent: 61 minutes  Marinda Elk [facility] Pager [phone]  If 7PM-7AM, please contact night-coverage [email or web address] Password [email or web address] 11/23/2012, 9:41 PM

## 2012-11-23 NOTE — ED Notes (Signed)
Called to give report nurse unavailable will call back.  

## 2012-11-23 NOTE — ED Notes (Signed)
Per pt's son, pt is in stage 4 lung cancer and was found sitting in car for over an hour. Per pt, was driving back from hair dresser and was tired/weak and feel asleep in car. Pt denies LOC. Pt denies pain but reports fatigue and nausea from riding in the car.

## 2012-11-23 NOTE — ED Notes (Signed)
Critical Hgb 6.0 reported to [employer]

## 2012-11-23 NOTE — Progress Notes (Signed)
Utilization Review completed.  Makinlee Awwad RN CM  

## 2012-11-24 ENCOUNTER — Telehealth: Payer: Self-pay | Admitting: *Deleted

## 2012-11-24 ENCOUNTER — Encounter: Payer: Self-pay | Admitting: *Deleted

## 2012-11-24 DIAGNOSIS — E43 Unspecified severe protein-calorie malnutrition: Secondary | ICD-10-CM | POA: Diagnosis present

## 2012-11-24 DIAGNOSIS — E871 Hypo-osmolality and hyponatremia: Secondary | ICD-10-CM | POA: Diagnosis present

## 2012-11-24 DIAGNOSIS — C3490 Malignant neoplasm of unspecified part of unspecified bronchus or lung: Secondary | ICD-10-CM | POA: Diagnosis present

## 2012-11-24 LAB — CBC
HCT: 27.5 % — ABNORMAL LOW (ref 36.0–46.0)
MCV: 86.8 fL (ref 78.0–100.0)
RBC: 3.17 MIL/uL — ABNORMAL LOW (ref 3.87–5.11)
WBC: 1.4 10*3/uL — CL (ref 4.0–10.5)

## 2012-11-24 NOTE — Progress Notes (Signed)
Patient admitted to room 1302-first unit of blood started upon arrival to floor.

## 2012-11-24 NOTE — Progress Notes (Signed)
[facility] PROGRESS NOTE  Sylvia [name] ZOX:[id number] DOB: 01-18-1927 DOA: 11/23/2012 PCP: Lupita Raider, MD  Assessment/Plan: 1. Metastatic NSCLC; under the care of Dr. Arlis Porta (oncologist). S/P XRT, undergoing current chemotherapy -Oncology has been consulted and will see patient today to determine plan of care (Dr.Magrinet to see)  2. anemia; most likely secondary to chemotherapy received on 11/16/2012, treated with 2 units PRBC  3. Leukopenia; most likely secondary to chemotherapy received on 11/16/2012 -Oncology to determine if patient to remain in hospital for dose of Neupogen vs discharge with close followup  4. HTN; slightly above AHA guidelines however given patient's age would not attempt to lower  5. Protein calorie malnutrition; per patient her baseline weight is 104 pounds, current weight 103 pounds  6. Hyponatremia; patient slightly hyponatremic, asymptomatic if oncology determines that patient should remain hospitalized will monitor and if required a correct   Code Status: Full Family Communication: Son-in-law and daughter present for discussion of plan of care  Disposition Plan: Per oncology   Consultants:  Oncology (Dr.Magrinet)  Procedures:  2 unit PRBC  Antibiotics:    HPI/Subjective: Sylvia [name] 79 y.o.WF PMHx Metastatic non-small cell lung cancer, adenocarcinoma, negative EGFR mutation, negative ALK gene translocation, presented with right upper lobe lung mass as well as mediastinal lymphadenopathy and left lower lobe lesion diagnosed in April of 2014 (see prior therapy/current therapy below), .   generalized weakness that has progressively gotten worse to the point she can even go to the bathroom without getting short of breath. Found to be hypoxic here to 85%. Exacerbated by: activity. The symptoms are relieved by sleep and rest. She relates no dysuria, cough and CP. Initial report was that patient was found to be asleep in her car in a  parking lot after driving back from the hairdresser. Per Patient she was just resting and remembers the entire episode. Her son-in-law when he arrived on scene EMS, and police were there and insisted that patient be transported to the ED for further workup. Patient was found to have initial H./H  6 0.0/17.3, WBC. 1.4, negative fever. Patient was transfused 2 units PRBC. Current H./H 9 0.4/27.5. Currently states negative dizziness, negative CP, negative SOB. Request to be discharged home   : PRIOR THERAPY:  1) Palliative radiotherapy under the care of Dr. Mitzi Hansen to the right upper lobe lung mass and mediastinum expected to be completed on 07/05/2012.  2) Systemic chemotherapy with carboplatin for AUC of 4 and Alimta 375 mg/M2 on 07/12/2012, status post 5 cycles. Last cycle on 10/23/12.  CURRENT THERAPY: Maintenance chemotherapy with single agent Alimta 375mg /M2 every 3 weeks. First cycle expected on 11/16/2012 by Dr. Arlis Porta (oncologist)  Objective: Filed Vitals:   11/24/12 0348 11/24/12 0350 11/24/12 0551 11/24/12 1400  BP: 135/45   132/45  Pulse: 76   76  Temp: 98.6 F (37 C)   98 F (36.7 C)  TempSrc: Oral   Oral  Resp: 18   18  Height:      Weight:   47.083 kg (103 lb 12.8 oz)   SpO2: 89% 90%  99%    Intake/Output Summary (Last 24 hours) at 11/24/12 1633 Last data filed at 11/24/12 0929  Gross per 24 hour  Intake    998 ml  Output    700 ml  Net    298 ml   Filed Weights   11/23/12 1734 11/23/12 2200 11/24/12 0551  Weight: 45.36 kg (100 lb) 47.356 kg (104 lb 6.4 oz) 47.083 kg (  103 lb 12.8 oz)    Exam:   General: A./O. x4,NAD  Cardiovascular: Regular rhythm and rate, +1/6 holosystolic murmur, negative rubs or gallops  Respiratory: Good auscultation bilateral  Abdomen: Soft nontender nondistended plus bowel sound  Musculoskeletal: Negative pedal edema   Data Reviewed: Basic Metabolic Panel:  Recent Labs Lab 11/23/12 1822  NA 127*  K 4.3  CL 91*  CO2 25   GLUCOSE 109*  BUN 36*  CREATININE 1.03  CALCIUM 8.5   Liver Function Tests:  Recent Labs Lab 11/23/12 1822  AST 22  ALT 11  ALKPHOS 68  BILITOT 0.4  PROT 6.4  ALBUMIN 2.7*   No results found for this basename: LIPASE, AMYLASE,  in the last 168 hours No results found for this basename: AMMONIA,  in the last 168 hours CBC:  Recent Labs Lab 11/23/12 1822 11/24/12 0620  WBC 1.5* 1.4*  NEUTROABS 1.1*  --   HGB 6.0* 9.4*  HCT 17.3* 27.5*  MCV 89.6 86.8  PLT 185 150   Cardiac Enzymes: No results found for this basename: CKTOTAL, CKMB, CKMBINDEX, TROPONINI,  in the last 168 hours BNP (last 3 results) No results found for this basename: PROBNP,  in the last 8760 hours CBG: No results found for this basename: GLUCAP,  in the last 168 hours  No results found for this or any previous visit (from the past 240 hour(s)).   Studies: Dg Chest 2 View  11/23/2012   CLINICAL DATA:  Hypoxia.  Lung cancer.  EXAM: CHEST  2 VIEW  COMPARISON:  11/10/2012.  FINDINGS: Right apical mass demonstrated on recent prior CT. Moderate right pleural effusion. Cardiopericardial silhouette mildly enlarged. Left basilar atelectasis and/ or scarring. Aortic arch atherosclerosis. There is no pulmonary edema. No definite airspace disease.  IMPRESSION: Right apical mass and likely malignant right pleural effusion appear unchanged allowing for technical differences compared to the recent CT 11/10/2012. No interval acute abnormality.   Electronically Signed   By: Andreas Newport M.D.   On: 11/23/2012 18:52    Scheduled Meds: . acetaminophen  650 mg Oral BID  . felodipine  10 mg Oral Daily  . folic acid  1 mg Oral Daily  . heparin  5,000 Units Subcutaneous Q8H  . irbesartan  300 mg Oral Daily   And  . hydrochlorothiazide  12.5 mg Oral Daily  . multivitamin with minerals  1 tablet Oral Daily  . polyethylene glycol  17 g Oral Daily  . sodium chloride  3 mL Intravenous Q12H   Continuous Infusions:    Principal Problem:   Normocytic anemia Active Problems:   HTN (hypertension)   Anemia   Lung cancer   Leukopenia   Protein-calorie malnutrition, severe   Non-small cell lung cancer   Hyponatremia    Time spent: 60 minutes    WOODS, CURTIS, J  [facility] Pager [phone]. If 7PM-7AM, please contact night-coverage at [email or web address], password [email or web address] 11/24/2012, 4:33 PM  LOS: 1 day

## 2012-11-24 NOTE — Plan of Care (Signed)
Problem: Phase I Progression Outcomes Goal: Other Phase I Outcomes/Goals Outcome: Progressing Patient with sats in low 90's on room air. O2 2l [place] in room but patient will not keep on.

## 2012-11-24 NOTE — Progress Notes (Addendum)
Wbc 1.4 today was 1.5 yesterday. Continue neutropenic precautions.

## 2012-11-24 NOTE — Progress Notes (Signed)
Pt daughter called with concerns about pt overall condition.  Family would like pt not to be able to drive and have questions about chemotherapy.  I notified Dr. Arbutus Ped.

## 2012-11-24 NOTE — Progress Notes (Signed)
INITIAL NUTRITION ASSESSMENT  Pt meets criteria for severe MALNUTRITION in the context of chronic illness as evidenced by pt with visible severe muscle wasting and subcutaneous fat loss in clavicles, arms, and hands.  DOCUMENTATION CODES Per approved criteria  -Severe malnutrition in the context of chronic illness   INTERVENTION: - Pt eating well during breakfast and denies wanting any nutritional supplements. Encouraged pt to continue to eat excellent.  - Discussed strategies to help with changes in taste - Encouraged high calorie/protein meals and snacks - Will continue to monitor   NUTRITION DIAGNOSIS: Increased nutrient needs related to metastatic non-small cell lung CA as evidenced by MD notes.   Goal: Pt to consume >90% of meals  Monitor:  Weights, labs, intake  Reason for Assessment: Nutrition risk   79 y.o. female  Admitting Dx: Normocytic anemia  ASSESSMENT: Pt with metastatic non-small cell lung CA, on chemotherapy, received dose last week, admitted with generalized weakness. Met with pt who reports poor appetite from chemotherapy but states she tries to eat 3-4 healthy well balanced meals/day at home and adds protein powder to her diet. Pt reports nothing tastes good but denies any metallic taste. Pt c/o weakness in her legs, states she needs cortisone in her legs. Pt has been maintaining her weight around 100 pounds for the past 4 months.   Nutrition Focused Physical Exam:  Subcutaneous Fat:  Orbital Region: mild/moderate wasting Upper Arm Region: severe wasting Thoracic and Lumbar Region: NA  Muscle:  Temple Region: mild/moderate wasting Clavicle Bone Region: severe wasting Clavicle and Acromion Bone Region: severe wasting Scapular Bone Region: NA Dorsal Hand: severe wasting Patellar Region: NA Anterior Thigh Region: NA Posterior Calf Region: NA  Edema: Intact   Height: Ht Readings from Last 1 Encounters:  11/23/12 5\' 3"  (1.6 m)    Weight: Wt  Readings from Last 1 Encounters:  11/24/12 103 lb 12.8 oz (47.083 kg)    Ideal Body Weight: 115 lb  % Ideal Body Weight: 89%  Wt Readings from Last 10 Encounters:  11/24/12 103 lb 12.8 oz (47.083 kg)  11/13/12 102 lb 3.2 oz (46.358 kg)  10/23/12 98 lb 14.4 oz (44.861 kg)  10/02/12 100 lb 1.6 oz (45.405 kg)  09/25/12 100 lb 3 oz (45.445 kg)  09/18/12 98 lb 4 oz (44.566 kg)  09/11/12 99 lb 3.2 oz (44.997 kg)  09/04/12 101 lb 4 oz (45.927 kg)  08/21/12 100 lb 9.6 oz (45.632 kg)  08/03/12 99 lb 8 oz (45.133 kg)    Usual Body Weight: 100 lb per pt  % Usual Body Weight: 103%  BMI:  Body mass index is 18.39 kg/(m^2). Underweight  Estimated Nutritional Needs: Kcal: 1400-1600 Protein: 55-70g Fluid: 1.4-1.6L/day  Skin: Intact  Diet Order: General  EDUCATION NEEDS: -No education needs identified at this time   Intake/Output Summary (Last 24 hours) at 11/24/12 0939 Last data filed at 11/24/12 0929  Gross per 24 hour  Intake      3 ml  Output    700 ml  Net   -697 ml    Last BM: 10/10  Labs:   Recent Labs Lab 11/23/12 1822  NA 127*  K 4.3  CL 91*  CO2 25  BUN 36*  CREATININE 1.03  CALCIUM 8.5  GLUCOSE 109*    CBG (last 3)  No results found for this basename: GLUCAP,  in the last 72 hours  Scheduled Meds: . acetaminophen  650 mg Oral BID  . felodipine  10 mg Oral  Daily  . folic acid  1 mg Oral Daily  . heparin  5,000 Units Subcutaneous Q8H  . irbesartan  300 mg Oral Daily   And  . hydrochlorothiazide  12.5 mg Oral Daily  . multivitamin with minerals  1 tablet Oral Daily  . polyethylene glycol  17 g Oral Daily  . sodium chloride  3 mL Intravenous Q12H    Continuous Infusions:   Past Medical History  Diagnosis Date  . HTN (hypertension)   . Renal insufficiency   . IBS (irritable bowel syndrome)   . Anemia 06/2010  . Raynaud's syndrome   . PVD (peripheral vascular disease)   . Carotid stenosis   . Raynaud phenomenon since 1990's  .  Hoarseness of voice   . PVD (peripheral vascular disease)   . History of radiation therapy 06/15/2012-07/05/2012    37.5 gray to right lung tumor    Past Surgical History  Procedure Laterality Date  . Colonoscopy  02/2004 and 05/2011    last colonoscopy in April 2013 was negative by Dr. Laural Benes  . Hemorrhoid surgery    . Breast surgery      lumpectomy- right  . Hemorrhoid surgery    . Artery biopsy  12/30/2011    Procedure: MINOR BIOPSY TEMPORAL ARTERY;  Surgeon: Currie Paris, MD;  Location: [facility];  Service: General;  Laterality: Right;  temoral artery biopsy -right side    Levon Hedger MS, RD, LDN [phone] Pager [phone] After Hours Pager

## 2012-11-24 NOTE — Telephone Encounter (Signed)
PT. WILL NOT BE DISCHARGED UNTIL DR.MOHAMED IS ABLE TO SEE HER. NOTIFIED DR.MOHAMED'S NURSE, DIANE BELL,RN. SHE WILL CONTACT DR.MOHAMED.

## 2012-11-24 NOTE — Consult Note (Signed)
Medical oncology Patient pacing the room and mildly agitated.  Son-in-law and daughter at bedside.  She voices that she is ok and that she would like to go home where she lives independently.  She states she is fine now.  She denies chest pain or acute shortness of breath.  She received 2 units of pRBC.   O: BP 132/45  Pulse 76  Temp(Src) 98 F (36.7 C) (Oral)  Resp 18  Ht 5\' 3"  (1.6 m)  Wt 103 lb 12.8 oz (47.083 kg)  BMI 18.39 kg/m2  SpO2 99%  Gen: Slightly agitated so the exam was limited.          Aware she is in the hospital. Oriented to self  CBC    Component Value Date/Time   WBC 1.4* 11/24/2012 0620   WBC 6.4 11/16/2012 0849   RBC 3.17* 11/24/2012 0620   RBC 2.92* 11/16/2012 0849   HGB 9.4* 11/24/2012 0620   HGB 8.9* 11/16/2012 0849   HCT 27.5* 11/24/2012 0620   HCT 26.7* 11/16/2012 0849   PLT 150 11/24/2012 0620   PLT 328 11/16/2012 0849   MCV 86.8 11/24/2012 0620   MCV 91.4 11/16/2012 0849   MCH 29.7 11/24/2012 0620   MCH 30.5 11/16/2012 0849   MCHC 34.2 11/24/2012 0620   MCHC 33.3 11/16/2012 0849   RDW 17.7* 11/24/2012 0620   RDW 19.2* 11/16/2012 0849   LYMPHSABS 0.2* 11/23/2012 1822   LYMPHSABS 0.9 11/16/2012 0849   MONOABS 0.2 11/23/2012 1822   MONOABS 1.3* 11/16/2012 0849   EOSABS 0.0 11/23/2012 1822   EOSABS 0.1 11/16/2012 0849   BASOSABS 0.0 11/23/2012 1822   BASOSABS 0.0 11/16/2012 0849    CMP     Component Value Date/Time   NA 127* 11/23/2012 1822   NA 132* 11/16/2012 0849   K 4.3 11/23/2012 1822   K 4.8 11/16/2012 0849   CL 91* 11/23/2012 1822   CL 98 08/07/2012 1134   CO2 25 11/23/2012 1822   CO2 25 11/16/2012 0849   GLUCOSE 109* 11/23/2012 1822   GLUCOSE 89 11/16/2012 0849   GLUCOSE 105* 08/07/2012 1134   BUN 36* 11/23/2012 1822   BUN 31.5* 11/16/2012 0849   CREATININE 1.03 11/23/2012 1822   CREATININE 0.9 11/16/2012 0849   CALCIUM 8.5 11/23/2012 1822   CALCIUM 9.4 11/16/2012 0849   PROT 6.4 11/23/2012 1822   PROT 7.1 11/16/2012 0849   ALBUMIN 2.7* 11/23/2012 1822    ALBUMIN 3.0* 11/16/2012 0849   AST 22 11/23/2012 1822   AST 26 11/16/2012 0849   ALT 11 11/23/2012 1822   ALT 11 11/16/2012 0849   ALKPHOS 68 11/23/2012 1822   ALKPHOS 77 11/16/2012 0849   BILITOT 0.4 11/23/2012 1822   BILITOT 0.26 11/16/2012 0849   GFRNONAA 49* 11/23/2012 1822   GFRAA 56* 11/23/2012 4056   AP: 79 years old white female with metastatic non-small cell lung cancer, adenocarcinoma with negative EGFR mutation and negative ALK gene translocation status post 6 cycles of systemic chemotherapy with reduced dose carboplatin and Alimta.  Now she is on maintenance chemotherapy with  single agent Alimta 375 mg/M2 every 3 weeks (last dose 10/02).   She was admitted with hemoglobin of 6 and white blood cell count of 1.5 (ANC  1.1).  She is now slightly agitated and wants to go home where she lives alone.    1. Metastatic NSCLC.  -- We will allow recovery of her counts and determine if dose adjustment is warranted.  2. Anemia secondary to chemotherapy. --Patient's hemoglobin of 6.0 was profoundly low.  She received 2 units of packed red blood cells.  Recheck CBC with differential in am to ensure hemoglobin is stable. Her baseline is around 9.    3. Neutropenia without fever.  -- She will need repeat labs on 10/13 to ensure that she is not in a long period of severe neutropenia.  She will be counseled to report any fevers.  Check CBC with differential in the am. We will hold on neupogen as she remains afebrile and we expect count recovery.   4. Hyponatremia likely secondary to #1.   --At her baseline.    5. Social work evaluation.  --Family expressed concerns for her continued independent living in light her recent hospitalization.  We may need social work/ case manager to evaluate if she need assistance at home.    6. Disposition.     Jaclyn Prime. Maurica Omura, MD Medical Oncology

## 2012-11-24 NOTE — Plan of Care (Signed)
Problem: Phase I Progression Outcomes Goal: OOB as tolerated unless otherwise ordered Outcome: Completed/Met Date Met:  11/24/12 Up with assist

## 2012-11-25 LAB — CBC WITH DIFFERENTIAL/PLATELET
Basophils Relative: 0 % (ref 0–1)
Eosinophils Absolute: 0 10*3/uL (ref 0.0–0.7)
Eosinophils Relative: 1 % (ref 0–5)
HCT: 28.9 % — ABNORMAL LOW (ref 36.0–46.0)
Hemoglobin: 9.9 g/dL — ABNORMAL LOW (ref 12.0–15.0)
Lymphocytes Relative: 20 % (ref 12–46)
MCH: 29.7 pg (ref 26.0–34.0)
MCHC: 34.3 g/dL (ref 30.0–36.0)
Monocytes Absolute: 0.6 10*3/uL (ref 0.1–1.0)
Neutro Abs: 0.6 10*3/uL — ABNORMAL LOW (ref 1.7–7.7)
Platelets: 157 10*3/uL (ref 150–400)
RBC: 3.33 MIL/uL — ABNORMAL LOW (ref 3.87–5.11)

## 2012-11-25 LAB — COMPREHENSIVE METABOLIC PANEL
ALT: 11 U/L (ref 0–35)
Alkaline Phosphatase: 78 U/L (ref 39–117)
BUN: 30 mg/dL — ABNORMAL HIGH (ref 6–23)
CO2: 25 mEq/L (ref 19–32)
Chloride: 94 mEq/L — ABNORMAL LOW (ref 96–112)
GFR calc Af Amer: 73 mL/min — ABNORMAL LOW (ref >90)
GFR calc non Af Amer: 63 mL/min — ABNORMAL LOW (ref >90)
Glucose, Bld: 109 mg/dL — ABNORMAL HIGH (ref 70–99)
Potassium: 4 mEq/L (ref 3.5–5.1)
Total Protein: 6.7 g/dL (ref 6.0–8.3)

## 2012-11-25 MED ORDER — MEGESTROL ACETATE 40 MG PO TABS: 320.0000 mg | ORAL_TABLET | Freq: Two times a day (BID) | ORAL | Status: DC

## 2012-11-25 MED ORDER — MEGESTROL ACETATE 400 MG/10ML PO SUSP
400.0000 mg | Freq: Two times a day (BID) | ORAL | Status: DC
  Administered 2012-11-25 – 2012-11-26 (×2): 400 mg via ORAL
  Filled 2012-11-25 (×3): qty 10

## 2012-11-25 NOTE — Progress Notes (Signed)
[facility] PROGRESS NOTE  Sylvia [name] ZOX:[id number] DOB: Nov 02, 1926 DOA: 11/23/2012 PCP: Lupita Raider, MD  Assessment/Plan:  1. Metastatic NSCLC; under the care of Dr. Arlis Porta (oncologist). S/P XRT, undergoing current chemotherapy -Oncology has been consulted and will see patient today to determine plan of care - (Dr.Chism saw  patient yesterday evening and requested lab work for today, and evaluation for home health  a. CBC with differential, CMP; showed H./H., WBC, and electrolytes are                 stable  b. PT/OT evaluation ordered; will contact CSW after patient seen by                 PT/OT         .  2. anemia; patient has continued anemia secondary to chemotherapy received on 11/16/2012, treated with 2 units PRBC. -H./H. stable    3. Leukopenia; continue leukopenia secondary to chemotherapy received on 11/16/2012  -Oncology per oncology note 11/24/2012 we'll hold on administering Neupogen. -Leukocyte count stable   4. HTN; within [facility] guidelines    5. Protein calorie malnutrition; per patient her baseline weight is 104 pounds, current weight 103 pounds  - Serum Albumin shows patient to be malnourished, pre-albumin pending, we'll need to address increase caloric intake with PCP/oncologist -Start Megace today  6. Hyponatremia; patient slightly hyponatremic, asymptomatic if oncology determines that patient should remain hospitalized will monitor and if required a correct -CMP ordered. We'll await results to determine course treatment; ADDENDUM sodium levels trending up  7. Malnutrition; prealbumin, vitamin D level pending;  -Counseled patient and daughter on nutrition status and patient has agreed to start Megace    Code Status: Full Family Communication:  Disposition Plan: Per oncology   Consultants:  Oncology  Procedures:    Antibiotics:    HPI/Subjective:  Wynelle Link 79 y.o.WF PMHx Metastatic non-small cell lung cancer,  adenocarcinoma, negative EGFR mutation, negative ALK gene translocation, presented with right upper lobe lung mass as well as mediastinal lymphadenopathy and left lower lobe lesion diagnosed in April of 2014 (see prior therapy/current therapy below), .  generalized weakness that has progressively gotten worse to the point she can even go to the bathroom without getting short of breath. Found to be hypoxic here to 85%. Exacerbated by: activity. The symptoms are relieved by sleep and rest. She relates no dysuria, cough and CP. Initial report was that patient was found to be asleep in her car in a parking lot after driving back from the hairdresser. Per Patient she was just resting and remembers the entire episode. Her son-in-law when he arrived on scene EMS, and police were there and insisted that patient be transported to the ED for further workup. Patient was found to have initial H./H 6 0.0/17.3, WBC. 1.4, negative fever. Patient was transfused 2 units PRBC. Current H./H 9 0.4/27.5. Currently states negative dizziness, negative CP, negative SOB. Request to be discharged home TODAY patient well, negative CP, negative SOB, negative N./V. request know which oncologist will see her in the a.m. in order to discharge    : PRIOR THERAPY:  1) Palliative radiotherapy under the care of Dr. Mitzi Hansen to the right upper lobe lung mass and mediastinum expected to be completed on 07/05/2012.  2) Systemic chemotherapy with carboplatin for AUC of 4 and Alimta 375 mg/M2 on 07/12/2012, status post 5 cycles. Last cycle on 10/23/12.  CURRENT THERAPY: Maintenance chemotherapy with single agent Alimta 375mg /M2 every 3 weeks. First cycle expected  on 11/16/2012 by Dr. Arlis Porta (oncologist)   Objective: Filed Vitals:   11/24/12 2235 11/25/12 0650 11/25/12 0652 11/25/12 0913  BP: 142/64 165/46  110/78  Pulse: 76 85  84  Temp: 98.1 F (36.7 C) 98.2 F (36.8 C)    TempSrc: Oral Oral    Resp: 16 20  16   Height:       Weight:  46.584 kg (102 lb 11.2 oz)    SpO2: 94% 87% 95% 94%    Intake/Output Summary (Last 24 hours) at 11/25/12 1114 Last data filed at 11/25/12 0900  Gross per 24 hour  Intake    360 ml  Output      0 ml  Net    360 ml   Filed Weights   11/23/12 2200 11/24/12 0551 11/25/12 0650  Weight: 47.356 kg (104 lb 6.4 oz) 47.083 kg (103 lb 12.8 oz) 46.584 kg (102 lb 11.2 oz)    Exam: General: A./O. x4,NAD  Cardiovascular: Regular rhythm and rate, +1/6 holosystolic murmur, negative rubs or gallops  Respiratory: Good auscultation bilateral  Abdomen: Soft nontender nondistended plus bowel sound  Musculoskeletal: +1 pedal edema bilateral         Data Reviewed: Basic Metabolic Panel:  Recent Labs Lab 11/23/12 1822  NA 127*  K 4.3  CL 91*  CO2 25  GLUCOSE 109*  BUN 36*  CREATININE 1.03  CALCIUM 8.5   Liver Function Tests:  Recent Labs Lab 11/23/12 1822  AST 22  ALT 11  ALKPHOS 68  BILITOT 0.4  PROT 6.4  ALBUMIN 2.7*   No results found for this basename: LIPASE, AMYLASE,  in the last 168 hours No results found for this basename: AMMONIA,  in the last 168 hours CBC:  Recent Labs Lab 11/23/12 1822 11/24/12 0620  WBC 1.5* 1.4*  NEUTROABS 1.1*  --   HGB 6.0* 9.4*  HCT 17.3* 27.5*  MCV 89.6 86.8  PLT 185 150   Cardiac Enzymes: No results found for this basename: CKTOTAL, CKMB, CKMBINDEX, TROPONINI,  in the last 168 hours BNP (last 3 results) No results found for this basename: PROBNP,  in the last 8760 hours CBG: No results found for this basename: GLUCAP,  in the last 168 hours  No results found for this or any previous visit (from the past 240 hour(s)).   Studies: Dg Chest 2 View  11/23/2012   CLINICAL DATA:  Hypoxia.  Lung cancer.  EXAM: CHEST  2 VIEW  COMPARISON:  11/10/2012.  FINDINGS: Right apical mass demonstrated on recent prior CT. Moderate right pleural effusion. Cardiopericardial silhouette mildly enlarged. Left basilar atelectasis and/ or  scarring. Aortic arch atherosclerosis. There is no pulmonary edema. No definite airspace disease.  IMPRESSION: Right apical mass and likely malignant right pleural effusion appear unchanged allowing for technical differences compared to the recent CT 11/10/2012. No interval acute abnormality.   Electronically Signed   By: Andreas Newport M.D.   On: 11/23/2012 18:52    Scheduled Meds: . acetaminophen  650 mg Oral BID  . felodipine  10 mg Oral Daily  . folic acid  1 mg Oral Daily  . heparin  5,000 Units Subcutaneous Q8H  . irbesartan  300 mg Oral Daily   And  . hydrochlorothiazide  12.5 mg Oral Daily  . multivitamin with minerals  1 tablet Oral Daily  . polyethylene glycol  17 g Oral Daily  . sodium chloride  3 mL Intravenous Q12H   Continuous Infusions:   Principal Problem:  Normocytic anemia Active Problems:   HTN (hypertension)   Anemia   Lung cancer   Leukopenia   Protein-calorie malnutrition, severe   Non-small cell lung cancer   Hyponatremia    Time spent: 35 minutes    WOODS, CURTIS, J  [facility] Pager [phone]. If 7PM-7AM, please contact night-coverage at [email or web address], password [facility] 11/25/2012, 11:14 AM  LOS: 2 days

## 2012-11-26 ENCOUNTER — Inpatient Hospital Stay (HOSPITAL_COMMUNITY): Payer: Medicare Other

## 2012-11-26 ENCOUNTER — Encounter (HOSPITAL_COMMUNITY): Payer: Self-pay | Admitting: Radiology

## 2012-11-26 ENCOUNTER — Other Ambulatory Visit: Payer: Self-pay | Admitting: Internal Medicine

## 2012-11-26 DIAGNOSIS — F29 Unspecified psychosis not due to a substance or known physiological condition: Secondary | ICD-10-CM

## 2012-11-26 DIAGNOSIS — D702 Other drug-induced agranulocytosis: Secondary | ICD-10-CM

## 2012-11-26 DIAGNOSIS — D649 Anemia, unspecified: Secondary | ICD-10-CM

## 2012-11-26 DIAGNOSIS — D709 Neutropenia, unspecified: Secondary | ICD-10-CM

## 2012-11-26 DIAGNOSIS — R5381 Other malaise: Secondary | ICD-10-CM

## 2012-11-26 LAB — COMPREHENSIVE METABOLIC PANEL
Alkaline Phosphatase: 66 U/L (ref 39–117)
BUN: 29 mg/dL — ABNORMAL HIGH (ref 6–23)
CO2: 26 mEq/L (ref 19–32)
Calcium: 8.9 mg/dL (ref 8.4–10.5)
Chloride: 97 mEq/L (ref 96–112)
Creatinine, Ser: 0.88 mg/dL (ref 0.50–1.10)
GFR calc Af Amer: 68 mL/min — ABNORMAL LOW (ref >90)
GFR calc non Af Amer: 59 mL/min — ABNORMAL LOW (ref >90)
Glucose, Bld: 95 mg/dL (ref 70–99)
Potassium: 3.9 mEq/L (ref 3.5–5.1)
Total Bilirubin: 0.3 mg/dL (ref 0.3–1.2)

## 2012-11-26 LAB — CBC WITH DIFFERENTIAL/PLATELET
Basophils Absolute: 0 10*3/uL (ref 0.0–0.1)
Eosinophils Relative: 3 % (ref 0–5)
Lymphs Abs: 0.3 10*3/uL — ABNORMAL LOW (ref 0.7–4.0)
MCV: 87.1 fL (ref 78.0–100.0)
Monocytes Relative: 37 % — ABNORMAL HIGH (ref 3–12)
Neutrophils Relative %: 34 % — ABNORMAL LOW (ref 43–77)
Platelets: 140 10*3/uL — ABNORMAL LOW (ref 150–400)
RBC: 2.95 MIL/uL — ABNORMAL LOW (ref 3.87–5.11)
RDW: 17.8 % — ABNORMAL HIGH (ref 11.5–15.5)
WBC: 1.3 10*3/uL — CL (ref 4.0–10.5)

## 2012-11-26 MED ORDER — ADULT MULTIVITAMIN W/MINERALS CH: 1.0000 | ORAL_TABLET | Freq: Every day | ORAL | Status: AC

## 2012-11-26 MED ORDER — FILGRASTIM 300 MCG/ML IJ SOLN
300.0000 ug | Freq: Once | INTRAMUSCULAR | Status: AC
  Administered 2012-11-26: 300 ug via SUBCUTANEOUS
  Filled 2012-11-26: qty 1

## 2012-11-26 MED ORDER — FOLIC ACID 1 MG PO TABS: 1.0000 mg | ORAL_TABLET | Freq: Every day | ORAL | Status: AC

## 2012-11-26 MED ORDER — MEGESTROL ACETATE 400 MG/10ML PO SUSP: 400.0000 mg | Freq: Two times a day (BID) | ORAL | Status: DC

## 2012-11-26 MED ORDER — IOHEXOL 300 MG/ML  SOLN
100.0000 mL | Freq: Once | INTRAMUSCULAR | Status: AC | PRN
  Administered 2012-11-26: 100 mL via INTRAVENOUS

## 2012-11-26 MED ORDER — POLYETHYLENE GLYCOL 3350 17 G PO PACK: 17.0000 g | PACK | Freq: Every day | ORAL | Status: DC

## 2012-11-26 MED ORDER — FELODIPINE ER 10 MG PO TB24: 10.0000 mg | ORAL_TABLET | Freq: Every day | ORAL | Status: DC

## 2012-11-26 MED ORDER — ACETAMINOPHEN 325 MG PO TABS: 650.0000 mg | ORAL_TABLET | Freq: Two times a day (BID) | ORAL | Status: AC

## 2012-11-26 MED ORDER — PROCHLORPERAZINE MALEATE 10 MG PO TABS: 10.0000 mg | ORAL_TABLET | Freq: Four times a day (QID) | ORAL | Status: DC | PRN

## 2012-11-26 MED ORDER — ALUM & MAG HYDROXIDE-SIMETH 200-200-20 MG/5ML PO SUSP: 15.0000 mL | Freq: Four times a day (QID) | ORAL | Status: AC | PRN

## 2012-11-26 NOTE — Progress Notes (Signed)
11/26/12 1532  Reviewed discharge instructions with patient and daughter. Both verbalized understanding of discharge instructions. Copy of discharge papers given to patient. Patient left with [employer] oxygen tank.

## 2012-11-26 NOTE — Progress Notes (Signed)
11/26/12 1150  Pulse Ox while walking 50ft with no oxygen drop to 87%.

## 2012-11-26 NOTE — Progress Notes (Signed)
Subjective: Mr. Sylvia [name] was seen and examined today. Her daughter was at the bedside. The patient is feeling better today with no specific complaints. She was eating her breakfast. She was admitted with confusion and significant fatigue. She was found to have hemoglobin down to 6.0 G./DL. The patient received 2 units of PRBCs transfusion. She also has neutropenia but no fever. She was noted during this admission to have developed in her oxygen saturation down to 80% during sleep. The patient denied having any significant fever or chills. She denied having any nausea or vomiting.  Objective: Vital signs in last 24 hours: Temp:  [98.1 F (36.7 C)-98.7 F (37.1 C)] 98.7 F (37.1 C) (10/12 0615) Pulse Rate:  [79-84] 79 (10/12 0615) Resp:  [16-20] 20 (10/12 0615) BP: (110-161)/(41-78) 128/55 mmHg (10/12 0615) SpO2:  [90 %-95 %] 91 % (10/12 0615) Weight:  [103 lb 13.4 oz (47.1 kg)] 103 lb 13.4 oz (47.1 kg) (10/12 0615)  Intake/Output from previous day: 10/11 0701 - 10/12 0700 In: 963 [P.O.:960; I.V.:3] Out: -  Intake/Output this shift:    General appearance: alert, cooperative, flushed and no distress Resp: clear to auscultation bilaterally Cardio: regular rate and rhythm, S1, S2 normal, no murmur, click, rub or gallop GI: soft, non-tender; bowel sounds normal; no masses,  no organomegaly Extremities: extremities normal, atraumatic, no cyanosis or edema  Lab Results:   Recent Labs  11/25/12 1140 11/26/12 0406  WBC 1.5* 1.3*  HGB 9.9* 8.8*  HCT 28.9* 25.7*  PLT 157 140*   BMET  Recent Labs  11/25/12 1140 11/26/12 0406  NA 129* 130*  K 4.0 3.9  CL 94* 97  CO2 25 26  GLUCOSE 109* 95  BUN 30* 29*  CREATININE 0.83 0.88  CALCIUM 9.4 8.9    Studies/Results: No results found.  Medications: I have reviewed the patient's current medications.  Assessment/Plan: 1) metastatic non-small cell lung cancer, adenocarcinoma status post 6 cycles of systemic chemotherapy with  carboplatin and Alimta and recently started on maintenance treatment with single agent Alimta status post 1 cycle given on 11/16/2012. I have a lengthy discussion with the patient and her daughter today about her treatment options and pros and cons of continuing systemic chemotherapy. Because of her significant fatigue and weakness as well as pancytopenia, I recommended for the patient to discontinue her maintenance treatment at this point and she would be monitored closely with repeat scan every 3 months for evaluation of her disease. The patient and her daughter agreed to the current plan.  2) chemotherapy-induced anemia: She received 2 units of PRBCs transfusion and feeling much better. The patient denied having any rectal bleeding but recently she had 2 episodes of epistaxis. She was advised to see ENT physician if she had any recurrent epistaxis. 3) neutropenia: Secondary to recent chemotherapy. I will order Neupogen 300 mcg subcutaneously x1 dose today before her discharge home. I will arrange for the patient to have repeat CBC and few days at the cancer Center to monitor her count. 4) confusion: I will order CT scan of the head with and without contrast to rule out any metastatic disease to the brain. 5) dyspnea and sleep apnea: The patient may benefit from home oxygen. 6) disposition: The patient is feeling better today and she can be discharged home with family support. She was also advised not to drive on her own at least for the next 1 to 2 weeks or longer if needed depending on her general condition. Thank you so much  for taking good care of Ms. Capp. Please call if you have any questions.  LOS: 3 days    Haynes Giannotti K. 11/26/2012

## 2012-11-26 NOTE — Progress Notes (Signed)
   CARE MANAGEMENT NOTE 11/26/2012  Patient:  Sylvia [name], Sylvia [name]   Account Number:  [id number]  Date Initiated:  11/26/2012  Documentation initiated by:  [facility]  Subjective/Objective Assessment:   mets lung cancer     Action/Plan:   home alone   Anticipated DC Date:  11/26/2012   Anticipated DC Plan:  HOME W HOME HEALTH SERVICES      DC Planning Services  CM consult      [facility] Choice  HOME HEALTH   Choice offered to / List presented to:  C-4 Adult Children   DME arranged  OXYGEN      DME agency  [facility]     HH arranged  HH-2 PT  HH-7 RESPIRATORY THERAPY      HH agency  [facility]   Status of service:  Completed, signed off Medicare Important Message given?   (If response is "NO", the following Medicare IM given date fields will be blank) Date Medicare IM given:   Date Additional Medicare IM given:    Discharge Disposition:  HOME W HOME HEALTH SERVICES  Per UR Regulation:    If discussed at Long Length of Stay Meetings, dates discussed:    Comments:  11/26/2012 1415 NCM had completed detailed Rx for [employer]. [facility] here to deliver portable tank. Explained to dtr to contact [employer] as soon as they arrive home to deliver oxygen. Spoke to dtr and offered choice. Agreeable to [facility] for HHPT and RT. Isidoro Donning RN CCM Case Mgmt phone [phone]  11/26/2012 1330 NCM spoke to pt and gave permission to speak to dtr, Marlou Porch. Requesting [employer]. Contacted [facility] with referral. Faxed facesheet, qualifying sats, and dc summary. [facility] scheduled to deliver to hospital. Isidoro Donning RN CCM Case Mgmt phone [phone]

## 2012-11-26 NOTE — Evaluation (Signed)
Physical Therapy Evaluation Patient Details Name: Sylvia [name] MRN: [id number] DOB: 1927/02/10 Today's Date: 11/26/2012 Time: 1350-1420 PT Time Calculation (min): 30 min  PT Assessment / Plan / Recommendation History of Present Illness  Patient is an 79 y/o female admitted with hypoxia and confusion as well as anemia with hemoglobin down to 6.  Clinical Impression  Patient presents close to functional baseline.  Daughter indicates patient may not remember safety tips and reports pt will have nighttime assist for about a week.  Feel she will benefit from HHPT at least for safety eval and to see that pt manages oxygen tubing safely as she is newly needing home O2.  No further acute PT needs.     PT Assessment  All further PT needs can be met in the next venue of care    Follow Up Recommendations  Home health PT;Supervision - Intermittent    Does the patient have the potential to tolerate intense rehabilitation    N/A  Barriers to Discharge  None      Equipment Recommendations  None recommended by PT    Recommendations for Other Services   None  Frequency   N/A   Precautions / Restrictions Precautions Precautions: None   Pertinent Vitals/Pain No pain complaints.  SpO2 93% ambulating on 1L O2      Mobility  Bed Mobility Bed Mobility: Not assessed Details for Bed Mobility Assistance: sitting at edge of bed Transfers Transfers: Sit to Stand;Stand to Sit Sit to Stand: 7: Independent;From bed Stand to Sit: 7: Independent;To bed Ambulation/Gait Ambulation/Gait Assistance: 7: Independent Ambulation Distance (Feet): 200 Feet Assistive device: None Ambulation/Gait Assistance Details: able to turn and stop without loss of balance, stepped over obstacle no loss of balance, able to turn head to look right and left and up without loss of balance or significant veering from straight path Stairs: Yes Stairs Assistance: 6: Modified independent (Device/Increase time) Stair  Management Technique: One rail Right;Alternating pattern;Forwards Number of Stairs: 10    Exercises Other Exercises Other Exercises: Educated pt and daughter in fall prevention information with handout given   PT Diagnosis: Abnormality of gait  PT Problem List: Decreased safety awareness PT Treatment Interventions:       PT Goals(Current goals can be found in the care plan section) Acute Rehab PT Goals PT Goal Formulation: No goals set, d/c therapy  Visit Information  Last PT Received On: 11/26/12 Assistance Needed: +1 History of Present Illness: Patient is an 79 y/o female admitted with hypoxia and confusion as well as anemia with hemoglobin down to 6.       Prior Functioning  Home Living Family/patient expects to be discharged to:: Private residence Living Arrangements: Alone Available Help at Discharge: Family;Available PRN/intermittently Type of Home: House Home Access: Stairs to enter [employer] of Steps: 2 Entrance Stairs-Rails: None Home Layout: Two level;Bed/bath upstairs Alternate Level Stairs-Number of Steps: 13 Alternate Level Stairs-Rails: Left Home Equipment: None Additional Comments: will have oxygen at home upon d/c Prior Function Level of Independence: Independent Communication Communication: No difficulties    Cognition  Cognition Arousal/Alertness: Awake/alert Behavior During Therapy: WFL for tasks assessed/performed Overall Cognitive Status: Within Functional Limits for tasks assessed    Extremity/Trunk Assessment Lower Extremity Assessment Lower Extremity Assessment: Overall WFL for tasks assessed (pedal edema noted) Cervical / Trunk Assessment Cervical / Trunk Assessment: Kyphotic   Balance High Level Balance High Level Balance Activites: Head turns;Turns  End of Session PT - End of Session Activity Tolerance: Patient tolerated treatment  well Patient left: with call bell/phone within reach;with family/visitor present  GP      [facility] 11/26/2012, 2:23 PM Sheran Lawless, PT [phone] 11/26/2012

## 2012-11-26 NOTE — Discharge Summary (Signed)
Physician Discharge Summary  Sylvia [name] JYN:[id number] DOB: December 12, 1926 DOA: 11/23/2012  PCP: Lupita Raider, MD  Admit date: 11/23/2012 Discharge date: 11/26/2012  Time spent:  Recommendations for Outpatient Follow-up:  1. Metastatic NSCLC; under the care of Dr. Arlis Porta (oncologist). S/P XRT, undergoing current chemotherapy  -Oncology has been consulted and will see patient today to determine plan of care - (Dr.Chism saw patient yesterday evening and requested lab work for today, and evaluation for home health  a. CBC with differential, CMP; showed H./H., WBC, and electrolytes are stable  b. PTevaluation; only requires intermittent physical therapy, will DC home  -Patient to followup with Dr. Arlis Porta in a few days per his progress note from 11/26/2012 -Per Dr. Esperanza Richters note patient also will stop maintenance chemotherapy for now. Oncology to follow -Patient found to have decreased O2 saturations with ambulation and sleeping. Patient to be discharged home with O2 .  2. anemia; patient has continued anemia secondary to chemotherapy received on 11/16/2012, treated with 2 units PRBC.  -H./H. Stable  3. Leukopenia; continue leukopenia secondary to chemotherapy received on 11/16/2012  -Per oncology note 11/26/2012 patient had received one dose Neupogen.  -Leukocyte count stable   4. HTN; high today, however most likely secondary to patient working with PT/OT and perform preparations for discharge    5. Protein calorie malnutrition; per patient her baseline weight is 104 pounds, current weight 103 pounds  - Serum Albumin shows patient to be malnourished, pre-albumin pending, we'll need to address increase caloric intake with PCP/oncologist  -Start Megace today   6. Hyponatremia; patient slightly hyponatremic, asymptomatic if oncology determines that patient should remain hospitalized will monitor and if required a correct  -CMP ordered. We'll await  results to determine course treatment; ADDENDUM sodium levels trending up   7. Malnutrition; prealbumin, vitamin D level pending;  -Counseled patient and daughter on nutrition status and patient has agreed to start Megace    Discharge Diagnoses:  Principal Problem:   Normocytic anemia Active Problems:   HTN (hypertension)   Anemia   Lung cancer   Leukopenia   Protein-calorie malnutrition, severe   Non-small cell lung cancer   Hyponatremia   Discharge Condition: Stable  Diet recommendation: General  Filed Weights   11/24/12 0551 11/25/12 0650 11/26/12 0615  Weight: 47.083 kg (103 lb 12.8 oz) 46.584 kg (102 lb 11.2 oz) 47.1 kg (103 lb 13.4 oz)    History of present illness:  Sylvia Link 79 y.o.WF PMHx Metastatic non-small cell lung cancer, adenocarcinoma, negative EGFR mutation, negative ALK gene translocation, presented with right upper lobe lung mass as well as mediastinal lymphadenopathy and left lower lobe lesion diagnosed in April of 2014 (see prior therapy/current therapy below), .  generalized weakness that has progressively gotten worse to the point she can even go to the bathroom without getting short of breath. Found to be hypoxic here to 85%. Exacerbated by: activity. The symptoms are relieved by sleep and rest. She relates no dysuria, cough and CP. Initial report was that patient was found to be asleep in her car in a parking lot after driving back from the hairdresser. Per Patient she was just resting and remembers the entire episode. Her son-in-law when he arrived on scene EMS, and police were there and insisted that patient be transported to the ED for further workup. Patient was found to have initial H./H 6 0.0/17.3, WBC. 1.4, negative fever. Patient was transfused 2 units PRBC. Current H./H 9 0.4/27.5. Currently states negative dizziness, negative  CP, negative SOB. Request to be discharged home 11/25/2012 patient well, negative CP, negative SOB, negative N./V. request  know which oncologist will see her in the a.m. in order to discharge TODAY states ready for discharge. Patient understands she will require home O2. ,    : PRIOR THERAPY:  1) Palliative radiotherapy under the care of Dr. Mitzi Hansen to the right upper lobe lung mass and mediastinum expected to be completed on 07/05/2012.  2) Systemic chemotherapy with carboplatin for AUC of 4 and Alimta 375 mg/M2 on 07/12/2012, status post 5 cycles. Last cycle on 10/23/12.  CURRENT THERAPY: Maintenance chemotherapy with single agent Alimta 375mg /M2 every 3 weeks. First cycle expected on 11/16/2012 by Dr. Arlis Porta (oncologist)   Hospital Course:    Procedures:  CT head with/without contrast 11/26/2012 1. No evidence of metastatic disease to the brain.  2. No acute findings.  3. Moderate atrophy and mild chronic microvascular ischemic change.  4. Stable appearance from the prior brain CT and MRI.   Consultations: Oncology (Dr. Ramon Dredge)   Discharge Exam: Filed Vitals:   11/25/12 0913 11/25/12 1445 11/25/12 2250 11/26/12 0615  BP: 110/78 128/41 161/54 128/55  Pulse: 84 81 79 79  Temp:  98.1 F (36.7 C) 98.5 F (36.9 C) 98.7 F (37.1 C)  TempSrc:  Oral Oral Oral  Resp: 16 16 20 20   Height:      Weight:    47.1 kg (103 lb 13.4 oz)  SpO2: 94% 90% 95% 91%   General: A./O. x4,NAD  Cardiovascular: Regular rhythm and rate, +1/6 holosystolic murmur, negative rubs or gallops  Respiratory: Good auscultation bilateral  Abdomen: Soft nontender nondistended plus bowel sound  Musculoskeletal: +1 pedal edema bilateral     Discharge Instructions   Future Appointments Provider Department Dept Phone   12/07/2012 9:15 AM Beverely Pace [facility] [phone]   12/07/2012 9:45 AM [facility] [phone]   12/07/2012 10:45 AM [facility] [place] [facility] [phone]    12/28/2012 9:00 AM [facility] [phone]   12/28/2012 9:30 AM [facility] [place] [facility] [phone]   08/07/2013 11:00 AM Everette Rank, MD [facility] [phone]       Medication List    ASK your doctor about these medications       acetaminophen 325 MG tablet  Commonly known as:  TYLENOL  Take 650 mg by mouth 2 (two) times daily.     alum & mag hydroxide-simeth 200-200-20 MG/5ML suspension  Commonly known as:  MAALOX/MYLANTA  Take by mouth every 6 (six) hours as needed for indigestion.     BENICAR HCT 40-12.5 MG per tablet  Generic drug:  olmesartan-hydrochlorothiazide  Take 1 tablet by mouth daily.     dexamethasone 4 MG tablet  Commonly known as:  DECADRON  4 Milligram by mouth twice a day the day before, day of and day after the chemotherapy every 3 weeks     felodipine 10 MG 24 hr tablet  Commonly known as:  PLENDIL  Take 10 mg by mouth daily.     FERROUS SULFATE PO  Take 1 tablet by mouth daily.     fish oil-omega-3 fatty acids 1000 MG capsule  Take 1 g by mouth daily.     folic acid 1 MG tablet  Commonly known as:  FOLVITE  Take 1 tablet (1  mg total) by mouth daily.     multivitamin with minerals Tabs tablet  Take 1 tablet by mouth daily.     polyethylene glycol packet  Commonly known as:  MIRALAX / GLYCOLAX  Take 17 g by mouth daily.     PRESCRIPTION MEDICATION  Inject into the vein once. PEMEtrexed (ALIMTA) 550 mg in sodium chloride 0.9 % 100 mL chemo infusion 375 mg/m2  1.45 m2 (Treatment Plan Actual)  Once 08/21/2012     prochlorperazine 10 MG tablet  Commonly known as:  COMPAZINE  Take 1 tablet (10 mg total) by mouth every 6 (six) hours as needed.     VITAMIN C PO  Take 1 tablet by mouth daily.     VITAMIN D (CHOLECALCIFEROL) PO  Take 1 tablet by mouth daily.     VITAMIN E COMPLETE Caps  Take 1 capsule by mouth daily.       No Known  Allergies    The results of significant diagnostics from this hospitalization (including imaging, microbiology, ancillary and laboratory) are listed below for reference.    Significant Diagnostic Studies: Dg Chest 2 View  11/23/2012   CLINICAL DATA:  Hypoxia.  Lung cancer.  EXAM: CHEST  2 VIEW  COMPARISON:  11/10/2012.  FINDINGS: Right apical mass demonstrated on recent prior CT. Moderate right pleural effusion. Cardiopericardial silhouette mildly enlarged. Left basilar atelectasis and/ or scarring. Aortic arch atherosclerosis. There is no pulmonary edema. No definite airspace disease.  IMPRESSION: Right apical mass and likely malignant right pleural effusion appear unchanged allowing for technical differences compared to the recent CT 11/10/2012. No interval acute abnormality.   Electronically Signed   By: Andreas Newport M.D.   On: 11/23/2012 18:52   Ct Head W Wo Contrast  11/26/2012   CLINICAL DATA:  Evaluate for possible metastatic disease. History of lung carcinoma.  EXAM: CT HEAD WITHOUT AND WITH CONTRAST  TECHNIQUE: Contiguous axial images were obtained from the base of the skull through the vertex without and with intravenous contrast  CONTRAST:  OMNIPAQUE IOHEXOL 300 MG/ML  SOLN  COMPARISON:  Brain MRI, 05/19/2012. Head CT, 01/26/2011.  FINDINGS: The ventricles are normal in configuration. There is ventricular and sulcal enlargement reflecting moderate atrophy. This is stable.  There are no parenchymal masses or mass effect. There are no areas of abnormal parenchymal enhancement to suggest metastatic disease.  Patchy white matter hypoattenuation is noted most consistent with mild chronic microvascular ischemic change. There is no evidence of a cortical infarct.  There are no extra-axial masses, abnormal fluid collections or abnormal enhancement.  There is no intracranial hemorrhage.  No skull lesion is seen. A few of the inferior mastoid air cells are filled with fluid attenuation  consistent with mild chronic effusions. The visualized sinuses are clear.  IMPRESSION: 1. No evidence of metastatic disease to the brain. 2. No acute findings. 3. Moderate atrophy and mild chronic microvascular ischemic change. 4. Stable appearance from the prior brain CT and MRI.   Electronically Signed   By: Amie Portland M.D.   On: 11/26/2012 09:51   Ct Chest W Contrast  11/10/2012   CLINICAL DATA:  Metastatic non-small cell lung cancer, adenocarcinoma, negative EGFR mutation, negative ALK gene translocation, presented with right upper lobe lung mass as well as mediastinal lymphadenopathy and left lower lobe lesion diagnosed in April of 2014  EXAM: CT CHEST, ABDOMEN, AND PELVIS WITH CONTRAST  TECHNIQUE: Multidetector CT imaging of the chest, abdomen and pelvis was performed following the standard  protocol during bolus administration of intravenous contrast.  CONTRAST:  80mL OMNIPAQUE IOHEXOL 300 MG/ML  SOLN  COMPARISON:  09/04/2012  FINDINGS: CT CHEST FINDINGS  There is no axillary lymphadenopathy. The previously measured high right peritracheal lymph node is stable at 9 mm short axis. The 12 mm pretracheal lymph node measured on the previous study is now 13 mm on image 18. No subcarinal or left axillary lymphadenopathy. Heart size is at upper normal. Trace pericardial effusion is stable. Coronary artery calcification is noted. Moderate right pleural effusion has progressed in the interval.  Right upper lobe pulmonary mass has not changed substantially in the interval, measuring 4.4 x 4.0 cm today compared to 4.3 x 3.9 cm previously. There has been interval development of airspace disease in the right upper lobe peripheral to the mass lesion, suggesting postobstructive consolidation. There is some similar airspace disease just posterior to the mass.  Emphysema is noted within the upper lungs bilaterally. The spiculated left lower lobe central opacity measures 1.1 x 1.9 cm today compared to 1.3 x 2.0 cm  previously.  Bone windows reveal no worrisome lytic or sclerotic osseous lesions.  CT ABDOMEN AND PELVIS FINDINGS  No focal abnormality is seen in the liver or spleen. The stomach, duodenum, and left adrenal gland are unremarkable. 11 x 6 mm low-density lesion in the tail of the pancreas is noted. There is no dilatation of the main pancreatic duct. There is trace intrahepatic biliary duct dilatation with upper normal common duct size at 7 mm diameter.  1.3 x 2.1 cm right adrenal nodule is new in the interval.  Atherosclerotic calcification seen of the abdominal aorta without aneurysm. No abdominal lymphadenopathy. No free fluid in the abdomen.  Imaging through the pelvis shows no free intraperitoneal fluid. There is no pelvic sidewall lymphadenopathy. Pelvic floor laxity is evident. No colonic diverticulitis. Prominent stool volume suggests clinical constipation. The terminal ileum is not well seen. The appendix is unremarkable.  Degenerative changes are seen in the lower lumbar spine and bilateral hips.  IMPRESSION: CT CHEST IMPRESSION  No substantial interval change in size of the primary right upper lobe mass. The sub solid left lower lobe nodule is also unchanged.  No substantial interval change in mediastinal lymphadenopathy.  Slight increase in moderate right pleural effusion.  CT ABDOMEN AND PELVIS IMPRESSION  1.3 x 2.1 cm necrotic right adrenal nodule, suspicious for metastatic disease.  11 x 6 mm low-density lesion in the tail of the pancreas. Coronal images suggest that this may communicate with the main pancreatic duct. Small IPMN would be a consideration. Attention on followup imaging is recommended.   Electronically Signed   By: Kennith Center M.D.   On: 11/10/2012 14:08   Ct Abdomen Pelvis W Contrast  11/10/2012   CLINICAL DATA:  Metastatic non-small cell lung cancer, adenocarcinoma, negative EGFR mutation, negative ALK gene translocation, presented with right upper lobe lung mass as well as  mediastinal lymphadenopathy and left lower lobe lesion diagnosed in April of 2014  EXAM: CT CHEST, ABDOMEN, AND PELVIS WITH CONTRAST  TECHNIQUE: Multidetector CT imaging of the chest, abdomen and pelvis was performed following the standard protocol during bolus administration of intravenous contrast.  CONTRAST:  80mL OMNIPAQUE IOHEXOL 300 MG/ML  SOLN  COMPARISON:  09/04/2012  FINDINGS: CT CHEST FINDINGS  There is no axillary lymphadenopathy. The previously measured high right peritracheal lymph node is stable at 9 mm short axis. The 12 mm pretracheal lymph node measured on the previous study is now 63  mm on image 18. No subcarinal or left axillary lymphadenopathy. Heart size is at upper normal. Trace pericardial effusion is stable. Coronary artery calcification is noted. Moderate right pleural effusion has progressed in the interval.  Right upper lobe pulmonary mass has not changed substantially in the interval, measuring 4.4 x 4.0 cm today compared to 4.3 x 3.9 cm previously. There has been interval development of airspace disease in the right upper lobe peripheral to the mass lesion, suggesting postobstructive consolidation. There is some similar airspace disease just posterior to the mass.  Emphysema is noted within the upper lungs bilaterally. The spiculated left lower lobe central opacity measures 1.1 x 1.9 cm today compared to 1.3 x 2.0 cm previously.  Bone windows reveal no worrisome lytic or sclerotic osseous lesions.  CT ABDOMEN AND PELVIS FINDINGS  No focal abnormality is seen in the liver or spleen. The stomach, duodenum, and left adrenal gland are unremarkable. 11 x 6 mm low-density lesion in the tail of the pancreas is noted. There is no dilatation of the main pancreatic duct. There is trace intrahepatic biliary duct dilatation with upper normal common duct size at 7 mm diameter.  1.3 x 2.1 cm right adrenal nodule is new in the interval.  Atherosclerotic calcification seen of the abdominal aorta without  aneurysm. No abdominal lymphadenopathy. No free fluid in the abdomen.  Imaging through the pelvis shows no free intraperitoneal fluid. There is no pelvic sidewall lymphadenopathy. Pelvic floor laxity is evident. No colonic diverticulitis. Prominent stool volume suggests clinical constipation. The terminal ileum is not well seen. The appendix is unremarkable.  Degenerative changes are seen in the lower lumbar spine and bilateral hips.  IMPRESSION: CT CHEST IMPRESSION  No substantial interval change in size of the primary right upper lobe mass. The sub solid left lower lobe nodule is also unchanged.  No substantial interval change in mediastinal lymphadenopathy.  Slight increase in moderate right pleural effusion.  CT ABDOMEN AND PELVIS IMPRESSION  1.3 x 2.1 cm necrotic right adrenal nodule, suspicious for metastatic disease.  11 x 6 mm low-density lesion in the tail of the pancreas. Coronal images suggest that this may communicate with the main pancreatic duct. Small IPMN would be a consideration. Attention on followup imaging is recommended.   Electronically Signed   By: Kennith Center M.D.   On: 11/10/2012 14:08    Microbiology: No results found for this or any previous visit (from the past 240 hour(s)).   Labs: Basic Metabolic Panel:  Recent Labs Lab 11/23/12 1822 11/25/12 1140 11/26/12 0406  NA 127* 129* 130*  K 4.3 4.0 3.9  CL 91* 94* 97  CO2 25 25 26   GLUCOSE 109* 109* 95  BUN 36* 30* 29*  CREATININE 1.03 0.83 0.88  CALCIUM 8.5 9.4 8.9   Liver Function Tests:  Recent Labs Lab 11/23/12 1822 11/25/12 1140 11/26/12 0406  AST 22 25 22   ALT 11 11 9   ALKPHOS 68 78 66  BILITOT 0.4 0.4 0.3  PROT 6.4 6.7 5.9*  ALBUMIN 2.7* 2.5* 2.3*   No results found for this basename: LIPASE, AMYLASE,  in the last 168 hours No results found for this basename: AMMONIA,  in the last 168 hours CBC:  Recent Labs Lab 11/23/12 1822 11/24/12 0620 11/25/12 1140 11/26/12 0406  WBC 1.5* 1.4* 1.5*  1.3*  NEUTROABS 1.1*  --  0.6* 0.4*  HGB 6.0* 9.4* 9.9* 8.8*  HCT 17.3* 27.5* 28.9* 25.7*  MCV 89.6 86.8 86.8 87.1  PLT 185  150 157 140*   Cardiac Enzymes: No results found for this basename: CKTOTAL, CKMB, CKMBINDEX, TROPONINI,  in the last 168 hours BNP: BNP (last 3 results) No results found for this basename: PROBNP,  in the last 8760 hours CBG: No results found for this basename: GLUCAP,  in the last 168 hours     Signed:  Carolyne Littles, MD [facility] [phone] pager

## 2012-11-26 NOTE — Progress Notes (Signed)
SATURATION QUALIFICATIONS: (This note is used to comply with regulatory documentation for home oxygen)  Patient Saturations on Room Air at Rest = 84%  Patient Saturations on Room Air while Ambulating = 87%  Patient Saturations on 2 Liters of oxygen while Ambulating = 97%  Please briefly explain why patient needs home oxygen:

## 2012-11-27 LAB — TYPE AND SCREEN
Antibody Screen: NEGATIVE
Unit division: 0
Unit division: 0

## 2012-11-27 LAB — VITAMIN D 25 HYDROXY (VIT D DEFICIENCY, FRACTURES): Vit D, 25-Hydroxy: 28 ng/mL — ABNORMAL LOW (ref 30–89)

## 2012-11-28 ENCOUNTER — Telehealth: Payer: Self-pay | Admitting: Internal Medicine

## 2012-11-28 ENCOUNTER — Telehealth: Payer: Self-pay | Admitting: *Deleted

## 2012-11-28 NOTE — Telephone Encounter (Signed)
S/w the pt and she is aware of her lab appt on 11/29/2012@11 :00am and to pick up her appt schedules at that time.

## 2012-11-28 NOTE — Telephone Encounter (Signed)
Received call from [facility] that pt is refusing any PT services at this time.  She feels she does not need them.  SLJ

## 2012-11-29 ENCOUNTER — Other Ambulatory Visit: Payer: Self-pay | Admitting: *Deleted

## 2012-11-29 ENCOUNTER — Other Ambulatory Visit (HOSPITAL_BASED_OUTPATIENT_CLINIC_OR_DEPARTMENT_OTHER): Payer: Medicare Other | Admitting: Lab

## 2012-11-29 DIAGNOSIS — C3410 Malignant neoplasm of upper lobe, unspecified bronchus or lung: Secondary | ICD-10-CM

## 2012-11-29 DIAGNOSIS — C771 Secondary and unspecified malignant neoplasm of intrathoracic lymph nodes: Secondary | ICD-10-CM

## 2012-11-29 LAB — CBC WITH DIFFERENTIAL/PLATELET
Basophils Absolute: 0 10*3/uL (ref 0.0–0.1)
EOS%: 0.2 % (ref 0.0–7.0)
Eosinophils Absolute: 0 10*3/uL (ref 0.0–0.5)
HCT: 25.6 % — ABNORMAL LOW (ref 34.8–46.6)
HGB: 8.7 g/dL — ABNORMAL LOW (ref 11.6–15.9)
LYMPH%: 1.7 % — ABNORMAL LOW (ref 14.0–49.7)
MCH: 30.4 pg (ref 25.1–34.0)
MONO#: 1.7 10*3/uL — ABNORMAL HIGH (ref 0.1–0.9)
NEUT#: 14.5 10*3/uL — ABNORMAL HIGH (ref 1.5–6.5)
NEUT%: 87.3 % — ABNORMAL HIGH (ref 38.4–76.8)
RDW: 19 % — ABNORMAL HIGH (ref 11.2–14.5)
lymph#: 0.3 10*3/uL — ABNORMAL LOW (ref 0.9–3.3)

## 2012-11-29 LAB — COMPREHENSIVE METABOLIC PANEL (CC13)
Albumin: 2.4 g/dL — ABNORMAL LOW (ref 3.5–5.0)
Anion Gap: 7 mEq/L (ref 3–11)
BUN: 35 mg/dL — ABNORMAL HIGH (ref 7.0–26.0)
CO2: 28 mEq/L (ref 22–29)
Calcium: 9.1 mg/dL (ref 8.4–10.4)
Chloride: 101 mEq/L (ref 98–109)
Creatinine: 1.1 mg/dL (ref 0.6–1.1)
Glucose: 141 mg/dl — ABNORMAL HIGH (ref 70–140)
Potassium: 4.3 mEq/L (ref 3.5–5.1)

## 2012-11-30 ENCOUNTER — Telehealth: Payer: Self-pay | Admitting: *Deleted

## 2012-11-30 ENCOUNTER — Other Ambulatory Visit: Payer: Self-pay | Admitting: *Deleted

## 2012-11-30 NOTE — Telephone Encounter (Signed)
Spoke with daughter about trip to [place] and updated pt condition according to Dr. Asa Lente note

## 2012-11-30 NOTE — Telephone Encounter (Signed)
Notified Dr. Arbutus Ped of CBC.  He instructed me to call and ask pt if she had fever or chill.  Pt stated no chills or fever and will call desk nurse if these symptoms develop.

## 2012-12-01 ENCOUNTER — Telehealth: Payer: Self-pay | Admitting: Internal Medicine

## 2012-12-01 NOTE — Telephone Encounter (Signed)
s.w. pt and advised on 10.27.14 appt.....Marland Kitchensched pt with Mrs. Pam at [facility] for 10.27.14 @ 9:30am...gv referral to Selena Batten to fax info to [phone].

## 2012-12-04 ENCOUNTER — Telehealth: Payer: Self-pay | Admitting: *Deleted

## 2012-12-04 ENCOUNTER — Telehealth: Payer: Self-pay | Admitting: Internal Medicine

## 2012-12-04 LAB — VITAMIN D 1,25 DIHYDROXY
Vitamin D 1, 25 (OH)2 Total: 60 pg/mL (ref 18–72)
Vitamin D3 1, 25 (OH)2: 60 pg/mL

## 2012-12-04 NOTE — Telephone Encounter (Signed)
Pt called with concerns about her O2.  She wanted to increase to 3L instead of the 2L.  I stated I needed to discuss with Dr. Arbutus Ped before she did this.  She stated she will keep on 2L and if she feels she needs more oxygen, she will call.

## 2012-12-04 NOTE — Telephone Encounter (Signed)
Faxed pt medical records to Dr/

## 2012-12-04 NOTE — Telephone Encounter (Signed)
Faxed pt medical records to [facility]

## 2012-12-05 ENCOUNTER — Emergency Department (HOSPITAL_COMMUNITY)
Admission: EM | Admit: 2012-12-05 | Discharge: 2012-12-06 | Disposition: A | Discharge status: Home / Self Care | Payer: Medicare Other | Attending: Emergency Medicine | Admitting: Emergency Medicine

## 2012-12-05 ENCOUNTER — Emergency Department (HOSPITAL_COMMUNITY): Payer: Medicare Other

## 2012-12-05 ENCOUNTER — Encounter (HOSPITAL_COMMUNITY): Payer: Self-pay | Admitting: Emergency Medicine

## 2012-12-05 DIAGNOSIS — J329 Chronic sinusitis, unspecified: Secondary | ICD-10-CM | POA: Insufficient documentation

## 2012-12-05 DIAGNOSIS — Z87448 Personal history of other diseases of urinary system: Secondary | ICD-10-CM | POA: Insufficient documentation

## 2012-12-05 DIAGNOSIS — R51 Headache: Secondary | ICD-10-CM | POA: Insufficient documentation

## 2012-12-05 DIAGNOSIS — Z8719 Personal history of other diseases of the digestive system: Secondary | ICD-10-CM | POA: Insufficient documentation

## 2012-12-05 DIAGNOSIS — Z87891 Personal history of nicotine dependence: Secondary | ICD-10-CM | POA: Insufficient documentation

## 2012-12-05 DIAGNOSIS — Z79899 Other long term (current) drug therapy: Secondary | ICD-10-CM | POA: Insufficient documentation

## 2012-12-05 DIAGNOSIS — R6883 Chills (without fever): Secondary | ICD-10-CM | POA: Insufficient documentation

## 2012-12-05 DIAGNOSIS — Z9981 Dependence on supplemental oxygen: Secondary | ICD-10-CM | POA: Insufficient documentation

## 2012-12-05 DIAGNOSIS — Z85118 Personal history of other malignant neoplasm of bronchus and lung: Secondary | ICD-10-CM | POA: Insufficient documentation

## 2012-12-05 DIAGNOSIS — D649 Anemia, unspecified: Secondary | ICD-10-CM | POA: Insufficient documentation

## 2012-12-05 DIAGNOSIS — I10 Essential (primary) hypertension: Secondary | ICD-10-CM | POA: Insufficient documentation

## 2012-12-05 DIAGNOSIS — Z923 Personal history of irradiation: Secondary | ICD-10-CM | POA: Insufficient documentation

## 2012-12-05 LAB — POCT I-STAT, CHEM 8
BUN: 42 mg/dL — ABNORMAL HIGH (ref 6–23)
Calcium, Ion: 1.16 mmol/L (ref 1.13–1.30)
Chloride: 95 mEq/L — ABNORMAL LOW (ref 96–112)
Creatinine, Ser: 1.3 mg/dL — ABNORMAL HIGH (ref 0.50–1.10)
Glucose, Bld: 90 mg/dL (ref 70–99)
HCT: 24 % — ABNORMAL LOW (ref 36.0–46.0)
Sodium: 130 mEq/L — ABNORMAL LOW (ref 135–145)
TCO2: 26 mmol/L (ref 0–100)

## 2012-12-05 LAB — CBC
Hemoglobin: 7.9 g/dL — ABNORMAL LOW (ref 12.0–15.0)
MCH: 30.6 pg (ref 26.0–34.0)
Platelets: 434 10*3/uL — ABNORMAL HIGH (ref 150–400)
RBC: 2.58 MIL/uL — ABNORMAL LOW (ref 3.87–5.11)
RDW: 18.7 % — ABNORMAL HIGH (ref 11.5–15.5)
WBC: 8.5 10*3/uL (ref 4.0–10.5)

## 2012-12-05 MED ORDER — SODIUM CHLORIDE 0.9 % IV SOLN
INTRAVENOUS | Status: DC
  Administered 2012-12-06: via INTRAVENOUS

## 2012-12-05 MED ORDER — ONDANSETRON HCL 4 MG/2ML IJ SOLN
4.0000 mg | Freq: Once | INTRAMUSCULAR | Status: DC
  Filled 2012-12-05: qty 2

## 2012-12-05 MED ORDER — FENTANYL CITRATE 0.05 MG/ML IJ SOLN
25.0000 ug | INTRAMUSCULAR | Status: DC | PRN
  Administered 2012-12-06: 25 ug via INTRAVENOUS
  Filled 2012-12-05: qty 2

## 2012-12-05 NOTE — ED Notes (Signed)
Pt reports that she last took Tylenol at 1900

## 2012-12-05 NOTE — ED Provider Notes (Signed)
CSN: [id number]     Arrival date & time 12/05/12  2155 History   First MD Initiated Contact with Patient 12/05/12 2259     Chief Complaint  Patient presents with  . Chills  . Headache   (Consider location/radiation/quality/duration/timing/severity/associated sxs/prior Treatment) HPI Hx per PT - Has lung cancer stage IV, last chemo about 3 weeks ago.  Tonight at home chills and HA, mod to severe, located top of her head.  No N/V. No photophobia, no neck pain or stiffness. No new cough or SOB. She is on home O2, she has been checking her temp all day - no fever.  No weakness/ numbness.  No trouble with speech or gait. She has been taking tylenol without relief. No sudden onset or thunderclap HA. HA started this am got worse tonight. No rash, ABD pain or dysuria. No recent travel. Her daughter is visiting from out of state and today has some cough and congestion.   Past Medical History  Diagnosis Date  . HTN (hypertension)   . Renal insufficiency   . IBS (irritable bowel syndrome)   . Anemia 06/2010  . Raynaud's syndrome   . PVD (peripheral vascular disease)   . Carotid stenosis   . Raynaud phenomenon since 1990's  . Hoarseness of voice   . PVD (peripheral vascular disease)   . History of radiation therapy 06/15/2012-07/05/2012    37.5 gray to right lung tumor   Past Surgical History  Procedure Laterality Date  . Colonoscopy  02/2004 and 05/2011    last colonoscopy in April 2013 was negative by Dr. Laural Benes  . Hemorrhoid surgery    . Breast surgery      lumpectomy- right  . Hemorrhoid surgery    . Artery biopsy  12/30/2011    Procedure: MINOR BIOPSY TEMPORAL ARTERY;  Surgeon: Currie Paris, MD;  Location: [facility];  Service: General;  Laterality: Right;  temoral artery biopsy -right side   Family History  Problem Relation Age of Onset  . Pneumonia Mother   . Stroke Father    History  Substance Use Topics  . Smoking status: Former Smoker -- 0.25 packs/day for  60 years    Quit date: 02/15/2009  . Smokeless tobacco: Not on file  . Alcohol Use: No   OB History   Grav Para Term Preterm Abortions TAB SAB Ect Mult Living                 Review of Systems  Constitutional: Positive for chills. Negative for fever.  HENT: Negative for sore throat.   Eyes: Negative for visual disturbance.  Respiratory: Negative for shortness of breath.   Cardiovascular: Negative for chest pain.  Gastrointestinal: Negative for abdominal pain.  Genitourinary: Negative for dysuria.  Musculoskeletal: Negative for back pain, neck pain and neck stiffness.  Skin: Negative for rash.  Neurological: Positive for headaches. Negative for seizures, syncope, weakness and numbness.  All other systems reviewed and are negative.    Allergies  Review of patient's allergies indicates no known allergies.  Home Medications   Current Outpatient Rx  Name  Route  Sig  Dispense  Refill  . acetaminophen (TYLENOL) 325 MG tablet   Oral   Take 2 tablets (650 mg total) by mouth 2 (two) times daily.   60 tablet   0   . alum & mag hydroxide-simeth (MAALOX/MYLANTA) 200-200-20 MG/5ML suspension   Oral   Take 15 mLs by mouth every 6 (six) hours as needed for indigestion.  355 mL   0   . Ascorbic Acid (VITAMIN C PO)   Oral   Take 1 tablet by mouth daily.         Marland Kitchen BENICAR HCT 40-12.5 MG per tablet   Oral   Take 1 tablet by mouth daily.          . felodipine (PLENDIL) 10 MG 24 hr tablet   Oral   Take 10 mg by mouth daily.         Marland Kitchen FERROUS SULFATE PO   Oral   Take 1 tablet by mouth daily.         . fish oil-omega-3 fatty acids 1000 MG capsule   Oral   Take 1 g by mouth daily.         . folic acid (FOLVITE) 1 MG tablet   Oral   Take 1 tablet (1 mg total) by mouth daily.   30 tablet   0   . Multiple Vitamin (MULTIVITAMIN WITH MINERALS) TABS tablet   Oral   Take 1 tablet by mouth daily.   30 tablet   0   . polyethylene glycol (MIRALAX / GLYCOLAX)  packet   Oral   Take 17 g by mouth daily.         . prochlorperazine (COMPAZINE) 10 MG tablet   Oral   Take 1 tablet (10 mg total) by mouth every 6 (six) hours as needed.   30 tablet   0   . VITAMIN D, CHOLECALCIFEROL, PO   Oral   Take 1 tablet by mouth daily.          . Vitamin Mixture (VITAMIN E COMPLETE) CAPS   Oral   Take 1 capsule by mouth daily.           BP 159/50  Pulse 71  Temp(Src) 98.1 F (36.7 C) (Oral)  Resp 20  Ht 5\' 3"  (1.6 m)  Wt 102 lb (46.267 kg)  BMI 18.07 kg/m2  SpO2 94% Physical Exam  Constitutional: She is oriented to person, place, and time. She appears well-developed and well-nourished.  HENT:  Head: Normocephalic and atraumatic.  Eyes: EOM are normal. Pupils are equal, round, and reactive to light.  Neck: Neck supple.  Cardiovascular: Normal rate, regular rhythm and intact distal pulses.   Pulmonary/Chest: Effort normal. No stridor. No respiratory distress. She exhibits no tenderness.  Dec breath sounds no wheezes  Abdominal: Soft. She exhibits no distension. There is no tenderness.  Musculoskeletal: Normal range of motion. She exhibits no tenderness.  symmetric 1 plus pretibial edema  Neurological: She is alert and oriented to person, place, and time. She displays normal reflexes. No cranial nerve deficit. Coordination normal.  Speech clear, no pronator drift, no unilateral deficits  Skin: Skin is warm and dry.    ED Course  Procedures (including critical care time) Labs Review Labs Reviewed  CBC - Abnormal; Notable for the following:    RBC 2.58 (*)    Hemoglobin 7.9 (*)    HCT 22.9 (*)    RDW 18.7 (*)    Platelets 434 (*)    All other components within normal limits  URINALYSIS, ROUTINE W REFLEX MICROSCOPIC - Abnormal; Notable for the following:    Leukocytes, UA SMALL (*)    All other components within normal limits  URINE MICROSCOPIC-ADD ON - Abnormal; Notable for the following:    Squamous Epithelial / LPF MANY (*)     All other components within normal limits  POCT I-STAT,  CHEM 8 - Abnormal; Notable for the following:    Sodium 130 (*)    Chloride 95 (*)    BUN 42 (*)    Creatinine, Ser 1.30 (*)    Hemoglobin 8.2 (*)    HCT 24.0 (*)    All other components within normal limits   Imaging Review Dg Chest 2 View  12/05/2012   CLINICAL DATA:  Chills and headache.  EXAM: CHEST  2 VIEW  COMPARISON:  11/23/2012  FINDINGS: Mass in volume loss in the right upper lung appears stable since previous study. Probable right hilar lymphadenopathy. Small right pleural effusion with basilar atelectasis. Emphysematous changes and fibrosis in the lungs. Normal heart size and pulmonary vascularity. Calcified and tortuous aorta. No significant changes since the previous study.  IMPRESSION: Right apical mass and volume loss with right hilar lymphadenopathy and right pleural effusion appear stable.   Electronically Signed   By: Burman Nieves M.D.   On: 12/05/2012 23:32   Ct Head Wo Contrast  12/05/2012   CLINICAL DATA:  Headache and chills  EXAM: CT HEAD WITHOUT CONTRAST  TECHNIQUE: Contiguous axial images were obtained from the base of the skull through the vertex without intravenous contrast.  COMPARISON:  CT 11/26/2012  FINDINGS: Moderate atrophy. Ventricular enlargement is stable and most consistent with atrophy. Mild chronic microvascular ischemic change in the white matter.  Negative for acute infarct, hemorrhage, or mass lesion. No bony abnormality.  Right mastoid sinus effusion with air-fluid level. Fluid in the middle ear also noted.  IMPRESSION: Atrophy and chronic microvascular ischemia. No acute abnormality.  Right mastoid sinus effusion.   Electronically Signed   By: Marlan Palau M.D.   On: 12/05/2012 23:38    IV fluids. IV fentanyl. IV Rocephin  Plan discharge home and keep scheduled followup with primary care physician tomorrow for recheck symptoms and followup labs, urine culture.  Prescription for Augmentin  provided with sinusitis precautions and instructions.  MDM  Diagnosis: Headache, sinusitis  CT brain, chest x-ray, labs reviewed as above - anemia noted. Patient will follow up with her primary care physician and her oncologist. UA reviewed and urine culture pending - antibiotics provided VS and nurse notes reviewed   Sunnie Nielsen, MD 12/06/12 [id number]

## 2012-12-05 NOTE — ED Notes (Signed)
Pt reports that she has had a HA starting today and has had chills.  Pt hx lung CA and has been having difficulty breathing as well.

## 2012-12-06 LAB — URINALYSIS, ROUTINE W REFLEX MICROSCOPIC
Glucose, UA: NEGATIVE mg/dL
Hgb urine dipstick: NEGATIVE
Ketones, ur: NEGATIVE mg/dL
Nitrite: NEGATIVE
pH: 6.5 (ref 5.0–8.0)

## 2012-12-06 LAB — URINE MICROSCOPIC-ADD ON

## 2012-12-06 MED ORDER — DEXTROSE 5 % IV SOLN
1.0000 g | Freq: Once | INTRAVENOUS | Status: AC
  Administered 2012-12-06: 1 g via INTRAVENOUS
  Filled 2012-12-06: qty 10

## 2012-12-06 MED ORDER — AMOXICILLIN-POT CLAVULANATE 875-125 MG PO TABS: 1.0000 | ORAL_TABLET | Freq: Two times a day (BID) | ORAL | Status: DC

## 2012-12-06 MED ORDER — OXYMETAZOLINE HCL 0.05 % NA SOLN
1.0000 | Freq: Once | NASAL | Status: AC
  Administered 2012-12-06: 1 via NASAL
  Filled 2012-12-06: qty 15

## 2012-12-06 NOTE — Discharge Instructions (Signed)

## 2012-12-07 ENCOUNTER — Ambulatory Visit (HOSPITAL_BASED_OUTPATIENT_CLINIC_OR_DEPARTMENT_OTHER): Payer: Medicare Other | Admitting: Physician Assistant

## 2012-12-07 ENCOUNTER — Ambulatory Visit: Payer: Medicare Other | Admitting: Internal Medicine

## 2012-12-07 ENCOUNTER — Ambulatory Visit: Payer: Medicare Other | Admitting: Physician Assistant

## 2012-12-07 ENCOUNTER — Encounter (HOSPITAL_COMMUNITY)
Admission: RE | Admit: 2012-12-07 | Discharge: 2012-12-07 | Disposition: A | Discharge status: Home / Self Care | Payer: Medicare Other | Source: Ambulatory Visit | Attending: Family Medicine | Admitting: Family Medicine

## 2012-12-07 ENCOUNTER — Other Ambulatory Visit: Payer: Medicare Other | Admitting: Lab

## 2012-12-07 ENCOUNTER — Ambulatory Visit: Payer: Medicare Other

## 2012-12-07 ENCOUNTER — Other Ambulatory Visit: Payer: Self-pay | Admitting: *Deleted

## 2012-12-07 ENCOUNTER — Telehealth: Payer: Self-pay | Admitting: Internal Medicine

## 2012-12-07 DIAGNOSIS — J329 Chronic sinusitis, unspecified: Secondary | ICD-10-CM

## 2012-12-07 DIAGNOSIS — D649 Anemia, unspecified: Secondary | ICD-10-CM | POA: Insufficient documentation

## 2012-12-07 DIAGNOSIS — R5381 Other malaise: Secondary | ICD-10-CM

## 2012-12-07 DIAGNOSIS — C3410 Malignant neoplasm of upper lobe, unspecified bronchus or lung: Secondary | ICD-10-CM

## 2012-12-07 DIAGNOSIS — D6481 Anemia due to antineoplastic chemotherapy: Secondary | ICD-10-CM

## 2012-12-07 DIAGNOSIS — C771 Secondary and unspecified malignant neoplasm of intrathoracic lymph nodes: Secondary | ICD-10-CM

## 2012-12-07 NOTE — Telephone Encounter (Signed)
Gave pt appt for lab,md ad CT for January 2015, gave pt oral contrast for CT

## 2012-12-07 NOTE — Progress Notes (Addendum)
[facility] [phone]   [phone]  OFFICE PROGRESS NOTE  [name],KIMBERLEE, MD [address] [place] [address]  DIAGNOSIS AND STAGE: : Metastatic non-small cell lung cancer, adenocarcinoma, negative EGFR mutation, negative ALK gene translocation, presented with right upper lobe lung mass as well as mediastinal lymphadenopathy and left lower lobe lesion diagnosed in April of 2014.   PRIOR THERAPY:  1) Palliative radiotherapy under the care of Dr. Mitzi Hansen to the right upper lobe lung mass and mediastinum expected to be completed on 07/05/2012. 2) Systemic chemotherapy with carboplatin for AUC of 4 and Alimta 375 mg/M2 on 07/12/2012, status post 5 cycles. Last cycle on 10/23/12.  CURRENT THERAPY: Maintenance chemotherapy with single agent Alimta 375mg /M2 every 3 weeks. First cycle expected on 11/16/2012  CHEMOTHERAPY INTENT: palliative/maintenance CURRENT # OF CHEMOTHERAPY CYCLES: 1 CURRENT ANTIEMETICS: Zofran, dexamethasone and Compazine  CURRENT SMOKING STATUS: Nonsmoker  ORAL CHEMOTHERAPY AND CONSENT: None  CURRENT BISPHOSPHONATES USE: None  PAIN MANAGEMENT: No pain  NARCOTICS INDUCED CONSTIPATION: None  LIVING WILL AND CODE STATUS: Full code.   INTERVAL HISTORY: Sylvia [name] 79 y.o. female returns to the clinic today for followup visit accompanied by her daughter. She ended up in the emergency room on Tuesday for a severe headache. She was evaluated and found to have a right mastoid sinusitis. She was discharged on a a course of Augmentin. She has not developed any diarrhea as yet related to the Augmentin. She reports that she is to see Dr. Zachery Dauer orthopedics regarding her legs discomfort. She complains of fatigue. She is wondering if she can supplement him having someone with her during the day with metal or service at night. [name] has this service and plans to discuss this with Dr. Arbutus Ped. She continues to have shortness of  breath with exertion but no significant chest pain, cough or hemoptysis. The patient denied having any significant weight loss or night sweats. She has no nausea or vomiting. She has no fever or chills. She did not tolerate single agent maintenance Alimta.   MEDICAL HISTORY: Past Medical History  Diagnosis Date  . HTN (hypertension)   . Renal insufficiency   . IBS (irritable bowel syndrome)   . Anemia 06/2010  . Raynaud's syndrome   . PVD (peripheral vascular disease)   . Carotid stenosis   . Raynaud phenomenon since 1990's  . Hoarseness of voice   . PVD (peripheral vascular disease)   . History of radiation therapy 06/15/2012-07/05/2012    37.5 gray to right lung tumor    ALLERGIES:  has No Known Allergies.  MEDICATIONS:  Current Outpatient Prescriptions  Medication Sig Dispense Refill  . acetaminophen (TYLENOL) 325 MG tablet Take 2 tablets (650 mg total) by mouth 2 (two) times daily.  60 tablet  0  . alum & mag hydroxide-simeth (MAALOX/MYLANTA) 200-200-20 MG/5ML suspension Take 15 mLs by mouth every 6 (six) hours as needed for indigestion.  355 mL  0  . amoxicillin-clavulanate (AUGMENTIN) 875-125 MG per tablet Take 1 tablet by mouth 2 (two) times daily.  10 tablet  0  . Ascorbic Acid (VITAMIN C PO) Take 1 tablet by mouth daily.      Marland Kitchen BENICAR HCT 40-12.5 MG per tablet Take 1 tablet by mouth daily.       Marland Kitchen dexamethasone (DECADRON) 4 MG tablet       . felodipine (PLENDIL) 10 MG 24 hr tablet Take 10 mg by mouth daily.      Marland Kitchen  FERROUS SULFATE PO Take 1 tablet by mouth daily.      . fish oil-omega-3 fatty acids 1000 MG capsule Take 1 g by mouth daily.      . folic acid (FOLVITE) 1 MG tablet Take 1 tablet (1 mg total) by mouth daily.  30 tablet  0  . Multiple Vitamin (MULTIVITAMIN WITH MINERALS) TABS tablet Take 1 tablet by mouth daily.  30 tablet  0  . polyethylene glycol (MIRALAX / GLYCOLAX) packet Take 17 g by mouth daily.      . prochlorperazine (COMPAZINE) 10 MG tablet Take 1 tablet  (10 mg total) by mouth every 6 (six) hours as needed.  30 tablet  0  . VITAMIN D, CHOLECALCIFEROL, PO Take 1 tablet by mouth daily.       . Vitamin Mixture (VITAMIN E COMPLETE) CAPS Take 1 capsule by mouth daily.        No current facility-administered medications for this visit.    SURGICAL HISTORY:  Past Surgical History  Procedure Laterality Date  . Colonoscopy  02/2004 and 05/2011    last colonoscopy in April 2013 was negative by Dr. Laural Benes  . Hemorrhoid surgery    . Breast surgery      lumpectomy- right  . Hemorrhoid surgery    . Artery biopsy  12/30/2011    Procedure: MINOR BIOPSY TEMPORAL ARTERY;  Surgeon: Currie Paris, MD;  Location: [facility];  Service: General;  Laterality: Right;  temoral artery biopsy -right side    REVIEW OF SYSTEMS:  Constitutional: positive for fatigue Eyes: negative Ears, nose, mouth, throat, and face: negative Respiratory: positive for cough and dyspnea on exertion Cardiovascular: negative Gastrointestinal: negative Genitourinary:negative Integument/breast: negative Hematologic/lymphatic: negative Musculoskeletal:negative Neurological: positive for headaches and Related to her current sinus infection Behavioral/Psych: positive for sleep disturbance Endocrine: negative Allergic/Immunologic: negative   PHYSICAL EXAMINATION: General appearance: alert, cooperative, fatigued and no distress Head: Normocephalic, without obvious abnormality, atraumatic Neck: no adenopathy, no JVD, supple, symmetrical, trachea midline and thyroid not enlarged, symmetric, no tenderness/mass/nodules Lymph nodes: Cervical, supraclavicular, and axillary nodes normal. Resp: clear to auscultation bilaterally and normal percussion bilaterally Back: symmetric, no curvature. ROM normal. No CVA tenderness. Cardio: regular rate and rhythm, S1, S2 normal, no murmur, click, rub or gallop GI: soft, non-tender; bowel sounds normal; no masses,  no  organomegaly Extremities: extremities normal, atraumatic, no cyanosis or edema Neurologic: Alert and oriented X 3, normal strength and tone. Normal symmetric reflexes. Normal coordination and gait  ECOG PERFORMANCE STATUS: 1 - Symptomatic but completely ambulatory  Blood pressure 126/50, pulse 97, temperature 97.4 F (36.3 C), temperature source Oral, resp. rate 19, height 5\' 3"  (1.6 m), weight 106 lb 11.2 oz (48.399 kg).  LABORATORY DATA: Lab Results  Component Value Date   WBC 8.5 12/05/2012   HGB 8.2* 12/05/2012   HCT 24.0* 12/05/2012   MCV 88.8 12/05/2012   PLT 434* 12/05/2012      Chemistry      Component Value Date/Time   NA 130* 12/05/2012 2350   NA 135* 11/29/2012 1110   K 4.6 12/05/2012 2350   K 4.3 11/29/2012 1110   CL 95* 12/05/2012 2350   CL 98 08/07/2012 1134   CO2 28 11/29/2012 1110   CO2 26 11/26/2012 0406   BUN 42* 12/05/2012 2350   BUN 35.0* 11/29/2012 1110   CREATININE 1.30* 12/05/2012 2350   CREATININE 1.1 11/29/2012 1110      Component Value Date/Time   CALCIUM 9.1 11/29/2012 1110  CALCIUM 8.9 11/26/2012 0406   ALKPHOS 100 11/29/2012 1110   ALKPHOS 66 11/26/2012 0406   AST 29 11/29/2012 1110   AST 22 11/26/2012 0406   ALT 12 11/29/2012 1110   ALT 9 11/26/2012 0406   BILITOT 0.21 11/29/2012 1110   BILITOT 0.3 11/26/2012 0406       RADIOGRAPHIC STUDIES: Ct Chest W Contrast  11/10/2012   CLINICAL DATA:  Metastatic non-small cell lung cancer, adenocarcinoma, negative EGFR mutation, negative ALK gene translocation, presented with right upper lobe lung mass as well as mediastinal lymphadenopathy and left lower lobe lesion diagnosed in April of 2014  EXAM: CT CHEST, ABDOMEN, AND PELVIS WITH CONTRAST  TECHNIQUE: Multidetector CT imaging of the chest, abdomen and pelvis was performed following the standard protocol during bolus administration of intravenous contrast.  CONTRAST:  80mL OMNIPAQUE IOHEXOL 300 MG/ML  SOLN  COMPARISON:  09/04/2012  FINDINGS:    CT CHEST FINDINGS  There is no axillary lymphadenopathy. The previously measured high right peritracheal lymph node is stable at 9 mm short axis. The 12 mm pretracheal lymph node measured on the previous study is now 13 mm on image 18. No subcarinal or left axillary lymphadenopathy. Heart size is at upper normal. Trace pericardial effusion is stable. Coronary artery calcification is noted. Moderate right pleural effusion has progressed in the interval.  Right upper lobe pulmonary mass has not changed substantially in the interval, measuring 4.4 x 4.0 cm today compared to 4.3 x 3.9 cm previously. There has been interval development of airspace disease in the right upper lobe peripheral to the mass lesion, suggesting postobstructive consolidation. There is some similar airspace disease just posterior to the mass.  Emphysema is noted within the upper lungs bilaterally. The spiculated left lower lobe central opacity measures 1.1 x 1.9 cm today compared to 1.3 x 2.0 cm previously.  Bone windows reveal no worrisome lytic or sclerotic osseous lesions.    CT ABDOMEN AND PELVIS FINDINGS  No focal abnormality is seen in the liver or spleen. The stomach, duodenum, and left adrenal gland are unremarkable. 11 x 6 mm low-density lesion in the tail of the pancreas is noted. There is no dilatation of the main pancreatic duct. There is trace intrahepatic biliary duct dilatation with upper normal common duct size at 7 mm diameter.  1.3 x 2.1 cm right adrenal nodule is new in the interval.  Atherosclerotic calcification seen of the abdominal aorta without aneurysm. No abdominal lymphadenopathy. No free fluid in the abdomen.  Imaging through the pelvis shows no free intraperitoneal fluid. There is no pelvic sidewall lymphadenopathy. Pelvic floor laxity is evident. No colonic diverticulitis. Prominent stool volume suggests clinical constipation. The terminal ileum is not well seen. The appendix is unremarkable.  Degenerative  changes are seen in the lower lumbar spine and bilateral hips.   IMPRESSION: CT CHEST IMPRESSION  No substantial interval change in size of the primary right upper lobe mass. The sub solid left lower lobe nodule is also unchanged.  No substantial interval change in mediastinal lymphadenopathy.  Slight increase in moderate right pleural effusion.  CT ABDOMEN AND PELVIS IMPRESSION  1.3 x 2.1 cm necrotic right adrenal nodule, suspicious for metastatic disease.  11 x 6 mm low-density lesion in the tail of the pancreas. Coronal images suggest that this may communicate with the main pancreatic duct. Small IPMN would be a consideration. Attention on followup imaging is recommended.   Electronically Signed   By: Kennith Center M.D.   On: 11/10/2012  14:08   ASSESSMENT AND PLAN: This is a very pleasant 79 years old white female with metastatic non-small cell lung cancer, adenocarcinoma with negative EGFR mutation and negative ALK gene translocation status post 6 cycles of systemic chemotherapy with reduced dose carboplatin and Alimta. Her recent scan showed stable disease except for small necrotic right adrenal nodule. She status post 1 cycle of maintenance single agent Alimta 375 mg per meter squared given every 3 weeks however she did not tolerate this well at all. Patient was discussed with and also seen by Dr. Arbutus Ped. He planned to place the patient on observation the patient and her daughter were in complete agreement with this plan. Patient was advised to complete her course of Augmentin as prescribed but also advised to take a probiotic while she was on this particular antibiotic. She is anemic at 7.9 and this is likely contributing to her fatigue. This is likely chemotherapy induced. We will arrange to transfuse a total of 2 units of packed red blood cells to address this anemia. She will followup with Dr. Arbutus Ped in 3 months with a restaging CT scan of the chest, abdomen and pelvis with contrast to reevaluate her  disease.  Laural Benes, Asherah Lavoy E, PA-C   She was advised to call immediately if she has any concerning symptoms in the interval.  The patient voices understanding of current disease status and treatment options and is in agreement with the current care plan.  All questions were answered. The patient knows to call the clinic with any problems, questions or concerns. We can certainly see the patient much sooner if necessary.   ADDENDUM: Hematology/Oncology Attending: I had the face to face encounter with the patient. I recommended her care plan. She is a very pleasant 79 years old white female with metastatic non-small cell lung cancer status post induction chemotherapy was carboplatin and Alimta status post 5 cycles followed by 1 cycle of maintenance chemotherapy with single agent Alimta but unfortunately the patient continues to have significant fatigue and weakness as well as chemotherapy-induced anemia requiring frequent PRBCs transfusion. She was recently admitted to [facility] with pancytopenia and generalized fatigue and weakness. She is currently undergoing treatment with Augmentin for right mastoid sinusitis. I have a lengthy discussion with the patient and her daughter today about her current condition. I recommended for the patient to discontinue her current systemic chemotherapy and to continue on observation for now. I would see her back for followup visit in 3 months with repeat CT scan of the chest, abdomen and pelvis for restaging of her disease. She was advised to call immediately she has any concerning symptoms in the interval. For the chemotherapy-induced anemia, I will arrange for the patient received 2 units of PRBCs transfusion. She was advised to call immediately if she has any concerning symptoms in the interval. Lajuana Matte., MD 12/10/2012

## 2012-12-07 NOTE — Patient Instructions (Signed)
Keep your scheduled appointment for the blood transfusion to address your anemia Follow with Dr. Arbutus Ped in 3 months with a restaging CT scan of the chest, abdomen and pelvis to reevaluate your disease

## 2012-12-08 ENCOUNTER — Ambulatory Visit: Payer: Medicare Other | Admitting: Lab

## 2012-12-08 ENCOUNTER — Ambulatory Visit (HOSPITAL_BASED_OUTPATIENT_CLINIC_OR_DEPARTMENT_OTHER): Payer: Medicare Other

## 2012-12-08 ENCOUNTER — Telehealth: Payer: Self-pay | Admitting: Internal Medicine

## 2012-12-08 VITALS — BP 149/60 | HR 74 | Temp 98.9°F | Resp 16

## 2012-12-08 DIAGNOSIS — D649 Anemia, unspecified: Secondary | ICD-10-CM

## 2012-12-08 DIAGNOSIS — D6481 Anemia due to antineoplastic chemotherapy: Secondary | ICD-10-CM

## 2012-12-08 LAB — PREPARE RBC (CROSSMATCH)

## 2012-12-08 MED ORDER — SODIUM CHLORIDE 0.9 % IV SOLN
250.0000 mL | Freq: Once | INTRAVENOUS | Status: AC
  Administered 2012-12-08: 250 mL via INTRAVENOUS

## 2012-12-08 MED ORDER — ACETAMINOPHEN 325 MG PO TABS
650.0000 mg | ORAL_TABLET | Freq: Once | ORAL | Status: AC
  Administered 2012-12-08: 650 mg via ORAL

## 2012-12-08 MED ORDER — ACETAMINOPHEN 325 MG PO TABS
ORAL_TABLET | ORAL | Status: AC
  Filled 2012-12-08: qty 2

## 2012-12-08 MED ORDER — DIPHENHYDRAMINE HCL 25 MG PO CAPS
25.0000 mg | ORAL_CAPSULE | Freq: Once | ORAL | Status: AC
  Administered 2012-12-08: 25 mg via ORAL

## 2012-12-08 MED ORDER — DIPHENHYDRAMINE HCL 25 MG PO CAPS
ORAL_CAPSULE | ORAL | Status: AC
  Filled 2012-12-08: qty 1

## 2012-12-08 NOTE — Patient Instructions (Signed)
Blood Transfusion  A blood transfusion replaces your blood or some of its parts. Blood is replaced when you have lost blood because of surgery, an accident, or for severe blood conditions like anemia. You can donate blood to be used on yourself if you have a planned surgery. If you lose blood during that surgery, your own blood can be given back to you. Any blood given to you is checked to make sure it matches your blood type. Your temperature, blood pressure, and heart rate (vital signs) will be checked often.  GET HELP RIGHT AWAY IF:   You feel sick to your stomach (nauseous) or throw up (vomit).  You have watery poop (diarrhea).  You have shortness of breath or trouble breathing.  You have blood in your pee (urine) or have dark colored pee.  You have chest pain or tightness.  Your eyes or skin turn yellow (jaundice).  You have a temperature by mouth above 102 F (38.9 C), not controlled by medicine.  You start to shake and have chills.  You develop a a red rash (hives) or feel itchy.  You develop lightheadedness or feel confused.  You develop back, joint, or muscle pain.  You do not feel hungry (lost appetite).  You feel tired, restless, or nervous.  You develop belly (abdominal) cramps. Document Released: 04/30/2008 Document Revised: 04/26/2011 Document Reviewed: 04/30/2008 [employer] Patient Information 2014 [employer].  

## 2012-12-08 NOTE — Telephone Encounter (Signed)
Sent pt to labs today and PRBC for today

## 2012-12-11 ENCOUNTER — Encounter: Payer: Self-pay | Admitting: *Deleted

## 2012-12-11 ENCOUNTER — Other Ambulatory Visit: Payer: Self-pay | Admitting: *Deleted

## 2012-12-11 LAB — TYPE AND SCREEN
ABO/RH(D): AB POS
Antibody Screen: NEGATIVE
Unit division: 0

## 2012-12-11 NOTE — Progress Notes (Signed)
Pt called and would like to have PT.  I have contacted Micheline Maze form PT via email to help with process.

## 2012-12-19 ENCOUNTER — Telehealth: Payer: Self-pay | Admitting: Internal Medicine

## 2012-12-19 NOTE — Telephone Encounter (Signed)
returned pt call..she wanted to r/s est to when daughter going to be here

## 2012-12-21 ENCOUNTER — Other Ambulatory Visit: Payer: Self-pay

## 2012-12-25 ENCOUNTER — Ambulatory Visit: Payer: Medicare Other | Admitting: Physical Therapy

## 2012-12-28 ENCOUNTER — Telehealth: Payer: Self-pay | Admitting: *Deleted

## 2012-12-28 ENCOUNTER — Other Ambulatory Visit: Payer: Medicare Other | Admitting: Lab

## 2012-12-28 ENCOUNTER — Ambulatory Visit: Payer: Medicare Other

## 2012-12-28 NOTE — Telephone Encounter (Signed)
Spoke with daughter this am.  She wanted to update me on status.  Concerns addressed

## 2012-12-29 ENCOUNTER — Telehealth: Payer: Self-pay | Admitting: *Deleted

## 2012-12-29 NOTE — Telephone Encounter (Signed)
Pt called stating that her PCP said it was okay for her to drive.  She wanted to see what Dr Donnald Garre thought.  Per Dr Donnald Garre, if PCP give her to the okay to drive, he is okay with her driving as well.  Pt verbalized understanding.  SLJ

## 2013-01-01 ENCOUNTER — Telehealth: Payer: Self-pay | Admitting: *Deleted

## 2013-01-01 NOTE — Telephone Encounter (Signed)
Per request from [facility] (facility that supplies pt's oxygen), they need an order for OCD titration she she can get the small portable O2 Tanks.  Order faxed to [phone].  SLJ

## 2013-01-01 NOTE — Telephone Encounter (Signed)
Called daughter back regarding driving concerns.  I spoke with Dimas Alexandria PA and Judeth Cornfield RN regarding concerns.  They stated Dr. Arbutus Ped was ok with driving if PCP was.  Pt is currently under observation. I spoke with the daughter and she understood and will convey information to family.

## 2013-01-04 ENCOUNTER — Telehealth: Payer: Self-pay | Admitting: *Deleted

## 2013-01-04 NOTE — Telephone Encounter (Signed)
Pt called and was confused about appt's.  I clarified appt's

## 2013-01-17 ENCOUNTER — Telehealth: Payer: Self-pay | Admitting: *Deleted

## 2013-01-17 NOTE — Telephone Encounter (Signed)
Pt called had questions about her getting dental work done.  I spoke with Dr. Arbutus Ped and he stated it was ok to get dental work done at this time. I called and left pt vm message regarding this information.

## 2013-01-19 ENCOUNTER — Telehealth: Payer: Self-pay | Admitting: *Deleted

## 2013-01-19 NOTE — Telephone Encounter (Signed)
Pt called and left me a vm to call.  I called back and spoke with pt.  She stated she had forms needing to be fill out that Dr. Arbutus Ped needed to sign.  I gave her the address of cancer center to send form in.  She verbalized understanding

## 2013-01-25 ENCOUNTER — Encounter: Payer: Self-pay | Admitting: Internal Medicine

## 2013-01-25 ENCOUNTER — Encounter: Payer: Self-pay | Admitting: *Deleted

## 2013-01-25 NOTE — Progress Notes (Signed)
Received medical alert form from [employer] and gave to [place] to be filled out.

## 2013-01-25 NOTE — Progress Notes (Signed)
Put [employer] form on nurse's desk.

## 2013-01-26 ENCOUNTER — Telehealth: Payer: Self-pay | Admitting: Medical Oncology

## 2013-01-26 NOTE — Telephone Encounter (Signed)
[employer] form completed and mailed. Pt requires continuous oxygen and heat for hypoxia and anemia.

## 2013-01-31 ENCOUNTER — Other Ambulatory Visit: Payer: Self-pay | Admitting: *Deleted

## 2013-01-31 ENCOUNTER — Ambulatory Visit: Payer: Medicare Other

## 2013-01-31 ENCOUNTER — Ambulatory Visit (HOSPITAL_BASED_OUTPATIENT_CLINIC_OR_DEPARTMENT_OTHER): Payer: Medicare Other

## 2013-01-31 ENCOUNTER — Encounter (HOSPITAL_COMMUNITY)
Admission: RE | Admit: 2013-01-31 | Discharge: 2013-01-31 | Disposition: A | Discharge status: Home / Self Care | Payer: Medicare Other | Source: Ambulatory Visit | Attending: Family Medicine | Admitting: Family Medicine

## 2013-01-31 VITALS — BP 173/56 | HR 73 | Temp 97.6°F | Resp 18

## 2013-01-31 DIAGNOSIS — D649 Anemia, unspecified: Secondary | ICD-10-CM

## 2013-01-31 DIAGNOSIS — C3410 Malignant neoplasm of upper lobe, unspecified bronchus or lung: Secondary | ICD-10-CM

## 2013-01-31 LAB — COMPREHENSIVE METABOLIC PANEL (CC13)
Albumin: 2.6 g/dL — ABNORMAL LOW (ref 3.5–5.0)
Anion Gap: 8 mEq/L (ref 3–11)
BUN: 25.1 mg/dL (ref 7.0–26.0)
CO2: 28 mEq/L (ref 22–29)
Chloride: 97 mEq/L — ABNORMAL LOW (ref 98–109)
Creatinine: 0.9 mg/dL (ref 0.6–1.1)
Glucose: 119 mg/dl (ref 70–140)
Potassium: 4.6 mEq/L (ref 3.5–5.1)
Total Bilirubin: <0.2 mg/dL (ref 0.20–1.20)

## 2013-01-31 LAB — CBC WITH DIFFERENTIAL/PLATELET
Basophils Absolute: 0 10*3/uL (ref 0.0–0.1)
Eosinophils Absolute: 0.3 10*3/uL (ref 0.0–0.5)
HCT: 21 % — ABNORMAL LOW (ref 34.8–46.6)
HGB: 7.1 g/dL — ABNORMAL LOW (ref 11.6–15.9)
LYMPH%: 6.4 % — ABNORMAL LOW (ref 14.0–49.7)
MCHC: 33.6 g/dL (ref 31.5–36.0)
MONO#: 0.9 10*3/uL (ref 0.1–0.9)
NEUT#: 5.3 10*3/uL (ref 1.5–6.5)
NEUT%: 75.9 % (ref 38.4–76.8)
Platelets: 389 10*3/uL (ref 145–400)
WBC: 6.9 10*3/uL (ref 3.9–10.3)
lymph#: 0.4 10*3/uL — ABNORMAL LOW (ref 0.9–3.3)

## 2013-01-31 LAB — PREPARE RBC (CROSSMATCH)

## 2013-01-31 MED ORDER — DIPHENHYDRAMINE HCL 25 MG PO CAPS
ORAL_CAPSULE | ORAL | Status: AC
  Filled 2013-01-31: qty 1

## 2013-01-31 MED ORDER — DIPHENHYDRAMINE HCL 25 MG PO CAPS
25.0000 mg | ORAL_CAPSULE | Freq: Once | ORAL | Status: AC
  Administered 2013-01-31: 25 mg via ORAL

## 2013-01-31 MED ORDER — ACETAMINOPHEN 325 MG PO TABS
650.0000 mg | ORAL_TABLET | Freq: Once | ORAL | Status: AC
  Administered 2013-01-31: 650 mg via ORAL

## 2013-01-31 MED ORDER — SODIUM CHLORIDE 0.9 % IV SOLN
250.0000 mL | Freq: Once | INTRAVENOUS | Status: AC
  Administered 2013-01-31: 250 mL via INTRAVENOUS

## 2013-01-31 MED ORDER — ACETAMINOPHEN 325 MG PO TABS
ORAL_TABLET | ORAL | Status: AC
  Filled 2013-01-31: qty 2

## 2013-01-31 NOTE — Progress Notes (Signed)
Hbg 7.1, per Dr Donnald Garre okay to give 2 units PRBC's.  Pt verbalized understanding.  SLJ

## 2013-01-31 NOTE — Patient Instructions (Signed)
Blood Transfusion Information WHAT IS A BLOOD TRANSFUSION? A transfusion is the replacement of blood or some of its parts. Blood is made up of multiple cells which provide different functions.  Red blood cells carry oxygen and are used for blood loss replacement.  White blood cells fight against infection.  Platelets control bleeding.  Plasma helps clot blood.  Other blood products are available for specialized needs, such as hemophilia or other clotting disorders. BEFORE THE TRANSFUSION  Who gives blood for transfusions?   You may be able to donate blood to be used at a later date on yourself (autologous donation).  Relatives can be asked to donate blood. This is generally not any safer than if you have received blood from a stranger. The same precautions are taken to ensure safety when a relative's blood is donated.  Healthy volunteers who are fully evaluated to make sure their blood is safe. This is blood bank blood. Transfusion therapy is the safest it has ever been in the practice of medicine. Before blood is taken from a donor, a complete history is taken to make sure that person has no history of diseases nor engages in risky social behavior (examples are intravenous drug use or sexual activity with multiple partners). The donor's travel history is screened to minimize risk of transmitting infections, such as malaria. The donated blood is tested for signs of infectious diseases, such as HIV and hepatitis. The blood is then tested to be sure it is compatible with you in order to minimize the chance of a transfusion reaction. If you or a relative donates blood, this is often done in anticipation of surgery and is not appropriate for emergency situations. It takes many days to process the donated blood. RISKS AND COMPLICATIONS Although transfusion therapy is very safe and saves many lives, the main dangers of transfusion include:   Getting an infectious disease.  Developing a  transfusion reaction. This is an allergic reaction to something in the blood you were given. Every precaution is taken to prevent this. The decision to have a blood transfusion has been considered carefully by your caregiver before blood is given. Blood is not given unless the benefits outweigh the risks. AFTER THE TRANSFUSION  Right after receiving a blood transfusion, you will usually feel much better and more energetic. This is especially true if your red blood cells have gotten low (anemic). The transfusion raises the level of the red blood cells which carry oxygen, and this usually causes an energy increase.  The nurse administering the transfusion will monitor you carefully for complications. HOME CARE INSTRUCTIONS  No special instructions are needed after a transfusion. You may find your energy is better. Speak with your caregiver about any limitations on activity for underlying diseases you may have. SEEK MEDICAL CARE IF:   Your condition is not improving after your transfusion.  You develop redness or irritation at the intravenous (IV) site. SEEK IMMEDIATE MEDICAL CARE IF:  Any of the following symptoms occur over the next 12 hours:  Shaking chills.  You have a temperature by mouth above 102 F (38.9 C), not controlled by medicine.  Chest, back, or muscle pain.  People around you feel you are not acting correctly or are confused.  Shortness of breath or difficulty breathing.  Dizziness and fainting.  You get a rash or develop hives.  You have a decrease in urine output.  Your urine turns a dark color or changes to pink, red, or brown. Any of the following   symptoms occur over the next 10 days:  You have a temperature by mouth above 102 F (38.9 C), not controlled by medicine.  Shortness of breath.  Weakness after normal activity.  The white part of the eye turns yellow (jaundice).  You have a decrease in the amount of urine or are urinating less often.  Your  urine turns a dark color or changes to pink, red, or brown. Document Released: 01/30/2000 Document Revised: 04/26/2011 Document Reviewed: 09/18/2007 [employer] Patient Information 2014 [employer].  

## 2013-02-01 LAB — TYPE AND SCREEN: Antibody Screen: NEGATIVE

## 2013-02-23 ENCOUNTER — Encounter: Payer: Self-pay | Admitting: *Deleted

## 2013-02-23 NOTE — Progress Notes (Signed)
Pt called with questions about her appt. I clarified. She verbalized understanding

## 2013-02-26 ENCOUNTER — Encounter (HOSPITAL_COMMUNITY): Payer: Self-pay

## 2013-02-26 ENCOUNTER — Other Ambulatory Visit (HOSPITAL_BASED_OUTPATIENT_CLINIC_OR_DEPARTMENT_OTHER): Payer: Medicare Other

## 2013-02-26 ENCOUNTER — Ambulatory Visit (HOSPITAL_COMMUNITY)
Admission: RE | Admit: 2013-02-26 | Discharge: 2013-02-26 | Disposition: A | Discharge status: Home / Self Care | Payer: Medicare Other | Source: Ambulatory Visit | Attending: Internal Medicine | Admitting: Internal Medicine

## 2013-02-26 ENCOUNTER — Telehealth: Payer: Self-pay | Admitting: *Deleted

## 2013-02-26 DIAGNOSIS — C3491 Malignant neoplasm of unspecified part of right bronchus or lung: Secondary | ICD-10-CM

## 2013-02-26 DIAGNOSIS — C3490 Malignant neoplasm of unspecified part of unspecified bronchus or lung: Secondary | ICD-10-CM | POA: Insufficient documentation

## 2013-02-26 DIAGNOSIS — M47817 Spondylosis without myelopathy or radiculopathy, lumbosacral region: Secondary | ICD-10-CM | POA: Insufficient documentation

## 2013-02-26 DIAGNOSIS — R918 Other nonspecific abnormal finding of lung field: Secondary | ICD-10-CM | POA: Insufficient documentation

## 2013-02-26 DIAGNOSIS — M76899 Other specified enthesopathies of unspecified lower limb, excluding foot: Secondary | ICD-10-CM | POA: Insufficient documentation

## 2013-02-26 DIAGNOSIS — I7090 Unspecified atherosclerosis: Secondary | ICD-10-CM | POA: Insufficient documentation

## 2013-02-26 DIAGNOSIS — R599 Enlarged lymph nodes, unspecified: Secondary | ICD-10-CM | POA: Insufficient documentation

## 2013-02-26 DIAGNOSIS — M1610 Unilateral primary osteoarthritis, unspecified hip: Secondary | ICD-10-CM | POA: Insufficient documentation

## 2013-02-26 DIAGNOSIS — J90 Pleural effusion, not elsewhere classified: Secondary | ICD-10-CM | POA: Insufficient documentation

## 2013-02-26 DIAGNOSIS — M169 Osteoarthritis of hip, unspecified: Secondary | ICD-10-CM | POA: Insufficient documentation

## 2013-02-26 DIAGNOSIS — C3410 Malignant neoplasm of upper lobe, unspecified bronchus or lung: Secondary | ICD-10-CM

## 2013-02-26 DIAGNOSIS — E279 Disorder of adrenal gland, unspecified: Secondary | ICD-10-CM | POA: Insufficient documentation

## 2013-02-26 DIAGNOSIS — J438 Other emphysema: Secondary | ICD-10-CM | POA: Insufficient documentation

## 2013-02-26 DIAGNOSIS — I319 Disease of pericardium, unspecified: Secondary | ICD-10-CM | POA: Insufficient documentation

## 2013-02-26 DIAGNOSIS — I059 Rheumatic mitral valve disease, unspecified: Secondary | ICD-10-CM | POA: Insufficient documentation

## 2013-02-26 DIAGNOSIS — K869 Disease of pancreas, unspecified: Secondary | ICD-10-CM | POA: Insufficient documentation

## 2013-02-26 LAB — COMPREHENSIVE METABOLIC PANEL (CC13)
ALBUMIN: 2.9 g/dL — AB (ref 3.5–5.0)
ALT: 14 U/L (ref 0–55)
AST: 23 U/L (ref 5–34)
Alkaline Phosphatase: 90 U/L (ref 40–150)
Anion Gap: 10 mEq/L (ref 3–11)
BUN: 30.1 mg/dL — AB (ref 7.0–26.0)
CO2: 27 mEq/L (ref 22–29)
Calcium: 9.5 mg/dL (ref 8.4–10.4)
Chloride: 94 mEq/L — ABNORMAL LOW (ref 98–109)
Creatinine: 0.9 mg/dL (ref 0.6–1.1)
GLUCOSE: 90 mg/dL (ref 70–140)
POTASSIUM: 4.7 meq/L (ref 3.5–5.1)
Sodium: 132 mEq/L — ABNORMAL LOW (ref 136–145)
Total Bilirubin: 0.21 mg/dL (ref 0.20–1.20)
Total Protein: 7.7 g/dL (ref 6.4–8.3)

## 2013-02-26 LAB — CBC WITH DIFFERENTIAL/PLATELET
BASO%: 0.5 % (ref 0.0–2.0)
BASOS ABS: 0 10*3/uL (ref 0.0–0.1)
EOS%: 3.2 % (ref 0.0–7.0)
Eosinophils Absolute: 0.3 10*3/uL (ref 0.0–0.5)
HEMATOCRIT: 22.9 % — AB (ref 34.8–46.6)
HGB: 7.7 g/dL — ABNORMAL LOW (ref 11.6–15.9)
LYMPH%: 6.5 % — ABNORMAL LOW (ref 14.0–49.7)
MCH: 30.3 pg (ref 25.1–34.0)
MCHC: 33.5 g/dL (ref 31.5–36.0)
MCV: 90.5 fL (ref 79.5–101.0)
MONO#: 1 10*3/uL — ABNORMAL HIGH (ref 0.1–0.9)
MONO%: 12.2 % (ref 0.0–14.0)
NEUT%: 77.6 % — ABNORMAL HIGH (ref 38.4–76.8)
NEUTROS ABS: 6.1 10*3/uL (ref 1.5–6.5)
Platelets: 386 10*3/uL (ref 145–400)
RBC: 2.53 10*6/uL — ABNORMAL LOW (ref 3.70–5.45)
RDW: 14.4 % (ref 11.2–14.5)
WBC: 7.9 10*3/uL (ref 3.9–10.3)
lymph#: 0.5 10*3/uL — ABNORMAL LOW (ref 0.9–3.3)

## 2013-02-26 MED ORDER — IOHEXOL 300 MG/ML  SOLN
100.0000 mL | Freq: Once | INTRAMUSCULAR | Status: AC | PRN
  Administered 2013-02-26: 80 mL via INTRAVENOUS

## 2013-02-26 NOTE — Telephone Encounter (Signed)
Per Dr Vista Mink, pt can have a f/u appt sooner then 1/19.  Asked if pt can come in on 1/16.  Pt states she has to check with her daughter and she will call back.  SLJ

## 2013-02-26 NOTE — Progress Notes (Signed)
Quick Note:  Call patient with the result and she needs an appointment to evaluate her anemia ______

## 2013-02-26 NOTE — Telephone Encounter (Signed)
Called and spoke with daughter regarding appt for 03/02/13.  She verbalized understanding of time and place of appt

## 2013-02-27 ENCOUNTER — Telehealth: Payer: Self-pay | Admitting: *Deleted

## 2013-02-27 NOTE — Telephone Encounter (Signed)
Message copied by Britt Bottom on Tue Feb 27, 2013  5:04 PM ------      Message from: Curt Bears      Created: Mon Feb 26, 2013  2:47 PM       Call patient with the result and she needs an appointment to evaluate her anemia ------

## 2013-02-27 NOTE — Telephone Encounter (Signed)
Per Dr Vista Mink, f/u appt moved to 03/02/13.  Pt aware of appt. SLJ

## 2013-03-01 ENCOUNTER — Ambulatory Visit: Payer: Medicare Other | Admitting: Internal Medicine

## 2013-03-02 ENCOUNTER — Encounter: Payer: Self-pay | Admitting: *Deleted

## 2013-03-02 ENCOUNTER — Telehealth: Payer: Self-pay | Admitting: Internal Medicine

## 2013-03-02 ENCOUNTER — Telehealth: Payer: Self-pay | Admitting: Medical Oncology

## 2013-03-02 ENCOUNTER — Ambulatory Visit: Payer: Medicare Other

## 2013-03-02 ENCOUNTER — Other Ambulatory Visit: Payer: Self-pay | Admitting: Medical Oncology

## 2013-03-02 ENCOUNTER — Encounter: Payer: Self-pay | Admitting: Internal Medicine

## 2013-03-02 ENCOUNTER — Ambulatory Visit (HOSPITAL_COMMUNITY)
Admission: RE | Admit: 2013-03-02 | Discharge: 2013-03-02 | Disposition: A | Discharge status: Home / Self Care | Payer: Medicare Other | Source: Ambulatory Visit | Attending: Physician Assistant | Admitting: Physician Assistant

## 2013-03-02 ENCOUNTER — Ambulatory Visit (HOSPITAL_BASED_OUTPATIENT_CLINIC_OR_DEPARTMENT_OTHER): Payer: Medicare Other | Admitting: Internal Medicine

## 2013-03-02 ENCOUNTER — Ambulatory Visit (HOSPITAL_BASED_OUTPATIENT_CLINIC_OR_DEPARTMENT_OTHER): Payer: Medicare Other

## 2013-03-02 ENCOUNTER — Ambulatory Visit (HOSPITAL_COMMUNITY)
Admission: RE | Admit: 2013-03-02 | Discharge: 2013-03-02 | Disposition: A | Discharge status: Home / Self Care | Payer: Medicare Other | Source: Ambulatory Visit | Attending: Internal Medicine | Admitting: Internal Medicine

## 2013-03-02 VITALS — BP 155/97 | HR 76 | Temp 97.8°F | Resp 18

## 2013-03-02 VITALS — BP 113/36 | HR 66 | Temp 97.1°F | Wt 106.0 lb

## 2013-03-02 DIAGNOSIS — G936 Cerebral edema: Secondary | ICD-10-CM | POA: Insufficient documentation

## 2013-03-02 DIAGNOSIS — C3490 Malignant neoplasm of unspecified part of unspecified bronchus or lung: Secondary | ICD-10-CM

## 2013-03-02 DIAGNOSIS — D649 Anemia, unspecified: Secondary | ICD-10-CM

## 2013-03-02 DIAGNOSIS — C3410 Malignant neoplasm of upper lobe, unspecified bronchus or lung: Secondary | ICD-10-CM

## 2013-03-02 DIAGNOSIS — R5381 Other malaise: Secondary | ICD-10-CM

## 2013-03-02 DIAGNOSIS — C7931 Secondary malignant neoplasm of brain: Secondary | ICD-10-CM

## 2013-03-02 DIAGNOSIS — H748X9 Other specified disorders of middle ear and mastoid, unspecified ear: Secondary | ICD-10-CM | POA: Insufficient documentation

## 2013-03-02 DIAGNOSIS — E278 Other specified disorders of adrenal gland: Secondary | ICD-10-CM

## 2013-03-02 DIAGNOSIS — R0609 Other forms of dyspnea: Secondary | ICD-10-CM

## 2013-03-02 DIAGNOSIS — R0989 Other specified symptoms and signs involving the circulatory and respiratory systems: Secondary | ICD-10-CM

## 2013-03-02 DIAGNOSIS — R42 Dizziness and giddiness: Secondary | ICD-10-CM

## 2013-03-02 DIAGNOSIS — C7949 Secondary malignant neoplasm of other parts of nervous system: Secondary | ICD-10-CM

## 2013-03-02 DIAGNOSIS — M47812 Spondylosis without myelopathy or radiculopathy, cervical region: Secondary | ICD-10-CM | POA: Insufficient documentation

## 2013-03-02 DIAGNOSIS — R05 Cough: Secondary | ICD-10-CM

## 2013-03-02 DIAGNOSIS — R059 Cough, unspecified: Secondary | ICD-10-CM

## 2013-03-02 DIAGNOSIS — R5383 Other fatigue: Secondary | ICD-10-CM

## 2013-03-02 DIAGNOSIS — G9389 Other specified disorders of brain: Secondary | ICD-10-CM | POA: Insufficient documentation

## 2013-03-02 DIAGNOSIS — M4310 Spondylolisthesis, site unspecified: Secondary | ICD-10-CM | POA: Insufficient documentation

## 2013-03-02 LAB — COMPREHENSIVE METABOLIC PANEL (CC13)
ALBUMIN: 2.8 g/dL — AB (ref 3.5–5.0)
ALT: 8 U/L (ref 0–55)
AST: 19 U/L (ref 5–34)
Alkaline Phosphatase: 93 U/L (ref 40–150)
Anion Gap: 9 mEq/L (ref 3–11)
BUN: 35.6 mg/dL — ABNORMAL HIGH (ref 7.0–26.0)
CALCIUM: 9.4 mg/dL (ref 8.4–10.4)
CO2: 27 mEq/L (ref 22–29)
CREATININE: 0.9 mg/dL (ref 0.6–1.1)
Chloride: 95 mEq/L — ABNORMAL LOW (ref 98–109)
GLUCOSE: 105 mg/dL (ref 70–140)
POTASSIUM: 5.1 meq/L (ref 3.5–5.1)
Sodium: 132 mEq/L — ABNORMAL LOW (ref 136–145)
Total Bilirubin: <0.2 mg/dL (ref 0.20–1.20)
Total Protein: 7 g/dL (ref 6.4–8.3)

## 2013-03-02 LAB — FERRITIN CHCC: FERRITIN: 462 ng/mL — AB (ref 9–269)

## 2013-03-02 LAB — CBC WITH DIFFERENTIAL/PLATELET
BASO%: 0.4 % (ref 0.0–2.0)
Basophils Absolute: 0 10*3/uL (ref 0.0–0.1)
EOS%: 1 % (ref 0.0–7.0)
Eosinophils Absolute: 0.1 10*3/uL (ref 0.0–0.5)
HCT: 18.9 % — ABNORMAL LOW (ref 34.8–46.6)
HGB: 6.3 g/dL — CL (ref 11.6–15.9)
LYMPH%: 6.5 % — ABNORMAL LOW (ref 14.0–49.7)
MCH: 30.3 pg (ref 25.1–34.0)
MCHC: 33.4 g/dL (ref 31.5–36.0)
MCV: 91 fL (ref 79.5–101.0)
MONO#: 1 10*3/uL — AB (ref 0.1–0.9)
MONO%: 12.4 % (ref 0.0–14.0)
NEUT%: 79.7 % — ABNORMAL HIGH (ref 38.4–76.8)
NEUTROS ABS: 6.5 10*3/uL (ref 1.5–6.5)
Platelets: 344 10*3/uL (ref 145–400)
RBC: 2.08 10*6/uL — AB (ref 3.70–5.45)
RDW: 14.9 % — ABNORMAL HIGH (ref 11.2–14.5)
WBC: 8.2 10*3/uL (ref 3.9–10.3)
lymph#: 0.5 10*3/uL — ABNORMAL LOW (ref 0.9–3.3)

## 2013-03-02 LAB — IRON AND TIBC CHCC
%SAT: 15 % — AB (ref 21–57)
IRON: 43 ug/dL (ref 41–142)
TIBC: 285 ug/dL (ref 236–444)
UIBC: 242 ug/dL (ref 120–384)

## 2013-03-02 LAB — PREPARE RBC (CROSSMATCH)

## 2013-03-02 MED ORDER — DIPHENHYDRAMINE HCL 25 MG PO CAPS
25.0000 mg | ORAL_CAPSULE | Freq: Once | ORAL | Status: AC
  Administered 2013-03-02: 25 mg via ORAL

## 2013-03-02 MED ORDER — DIPHENHYDRAMINE HCL 25 MG PO CAPS
ORAL_CAPSULE | ORAL | Status: AC
  Filled 2013-03-02: qty 1

## 2013-03-02 MED ORDER — ACETAMINOPHEN 325 MG PO TABS
650.0000 mg | ORAL_TABLET | Freq: Once | ORAL | Status: AC
  Administered 2013-03-02: 650 mg via ORAL

## 2013-03-02 MED ORDER — GADOBENATE DIMEGLUMINE 529 MG/ML IV SOLN
7.0000 mL | Freq: Once | INTRAVENOUS | Status: AC | PRN
  Administered 2013-03-02: 7 mL via INTRAVENOUS

## 2013-03-02 MED ORDER — ERLOTINIB HCL 150 MG PO TABS: 150.0000 mg | ORAL_TABLET | Freq: Every day | ORAL | Status: DC

## 2013-03-02 MED ORDER — SODIUM CHLORIDE 0.9 % IV SOLN
250.0000 mL | Freq: Once | INTRAVENOUS | Status: AC
  Administered 2013-03-02: 250 mL via INTRAVENOUS

## 2013-03-02 MED ORDER — ACETAMINOPHEN 325 MG PO TABS
ORAL_TABLET | ORAL | Status: AC
  Filled 2013-03-02: qty 2

## 2013-03-02 NOTE — Progress Notes (Signed)
Spoke with pt daughter at [facility] after she and pt meet with Dr. Julien Nordmann.  I gave the daughter information on Hospice and social workers card to call for more questions.  I will notify Education officer, museum.

## 2013-03-02 NOTE — Patient Instructions (Signed)
Blood Transfusion Information WHAT IS A BLOOD TRANSFUSION? A transfusion is the replacement of blood or some of its parts. Blood is made up of multiple cells which provide different functions.  Red blood cells carry oxygen and are used for blood loss replacement.  White blood cells fight against infection.  Platelets control bleeding.  Plasma helps clot blood.  Other blood products are available for specialized needs, such as hemophilia or other clotting disorders. BEFORE THE TRANSFUSION  Who gives blood for transfusions?   You may be able to donate blood to be used at a later date on yourself (autologous donation).  Relatives can be asked to donate blood. This is generally not any safer than if you have received blood from a stranger. The same precautions are taken to ensure safety when a relative's blood is donated.  Healthy volunteers who are fully evaluated to make sure their blood is safe. This is blood bank blood. Transfusion therapy is the safest it has ever been in the practice of medicine. Before blood is taken from a donor, a complete history is taken to make sure that person has no history of diseases nor engages in risky social behavior (examples are intravenous drug use or sexual activity with multiple partners). The donor's travel history is screened to minimize risk of transmitting infections, such as malaria. The donated blood is tested for signs of infectious diseases, such as HIV and hepatitis. The blood is then tested to be sure it is compatible with you in order to minimize the chance of a transfusion reaction. If you or a relative donates blood, this is often done in anticipation of surgery and is not appropriate for emergency situations. It takes many days to process the donated blood. RISKS AND COMPLICATIONS Although transfusion therapy is very safe and saves many lives, the main dangers of transfusion include:   Getting an infectious disease.  Developing a  transfusion reaction. This is an allergic reaction to something in the blood you were given. Every precaution is taken to prevent this. The decision to have a blood transfusion has been considered carefully by your caregiver before blood is given. Blood is not given unless the benefits outweigh the risks. AFTER THE TRANSFUSION  Right after receiving a blood transfusion, you will usually feel much better and more energetic. This is especially true if your red blood cells have gotten low (anemic). The transfusion raises the level of the red blood cells which carry oxygen, and this usually causes an energy increase.  The nurse administering the transfusion will monitor you carefully for complications. HOME CARE INSTRUCTIONS  No special instructions are needed after a transfusion. You may find your energy is better. Speak with your caregiver about any limitations on activity for underlying diseases you may have. SEEK MEDICAL CARE IF:   Your condition is not improving after your transfusion.  You develop redness or irritation at the intravenous (IV) site. SEEK IMMEDIATE MEDICAL CARE IF:  Any of the following symptoms occur over the next 12 hours:  Shaking chills.  You have a temperature by mouth above 102 F (38.9 C), not controlled by medicine.  Chest, back, or muscle pain.  People around you feel you are not acting correctly or are confused.  Shortness of breath or difficulty breathing.  Dizziness and fainting.  You get a rash or develop hives.  You have a decrease in urine output.  Your urine turns a dark color or changes to pink, red, or brown. Any of the following   symptoms occur over the next 10 days:  You have a temperature by mouth above 102 F (38.9 C), not controlled by medicine.  Shortness of breath.  Weakness after normal activity.  The white part of the eye turns yellow (jaundice).  You have a decrease in the amount of urine or are urinating less often.  Your  urine turns a dark color or changes to pink, red, or brown. Document Released: 01/30/2000 Document Revised: 04/26/2011 Document Reviewed: 09/18/2007 [employer] Patient Information 2014 [employer].  

## 2013-03-02 NOTE — Progress Notes (Signed)
RECEIVED A FAX FROM [facility] CONCERNING A PRIOR AUTHORIZATION FOR Gamaliel. THIS REQUEST WAS PLACED IN THE MANAGED CARE BIN.

## 2013-03-02 NOTE — Telephone Encounter (Signed)
Gave pt appt for lab and ML for 03/16/12

## 2013-03-02 NOTE — Progress Notes (Signed)
Spoke with pt and daughters at [facility] visit today.  Gave and explained information on Tarceva.  They verbalized understanding of instructions.

## 2013-03-02 NOTE — Progress Notes (Signed)
[facility], [id number], approved tarceva 140mg  from 03/02/13-03/01/16 auth # [id number]

## 2013-03-02 NOTE — Progress Notes (Signed)
Sylvia [name] [phone]   Fax:(336) [place] [address] [address]  DIAGNOSIS AND STAGE: : Metastatic non-small cell lung cancer, adenocarcinoma, negative EGFR mutation, negative ALK gene translocation, presented with right upper lobe lung mass as well as mediastinal lymphadenopathy and left lower lobe lesion diagnosed in April of 2014.   PRIOR THERAPY:  1) Palliative radiotherapy under the care of Dr. Lisbeth Renshaw to the right upper lobe lung mass and mediastinum expected to be completed on 07/05/2012. 2) Systemic chemotherapy with carboplatin for AUC of 4 and Alimta 375 mg/M2 on 07/12/2012, status post 5 cycles. Last cycle on 10/23/12. 3) Maintenance chemotherapy with single agent Alimta 358m/M2 every 3 weeks. First cycle was on 11/16/2012. 4) Maintenance chemotherapy with single agent Alimta 3773mM2 every 3 weeks. First cycle was on 11/16/2012 discontinued secondary to intolerance.  CURRENT THERAPY: Tarceva 150 mg by mouth daily. This is expected to start in the next few days.  CHEMOTHERAPY INTENT: palliative/maintenance CURRENT # OF CHEMOTHERAPY CYCLES: 1 CURRENT ANTIEMETICS: Zofran, dexamethasone and Compazine  CURRENT SMOKING STATUS: Nonsmoker  ORAL CHEMOTHERAPY AND CONSENT: Tarceva and the patient signed consent on 03/02/2013  CURRENT BISPHOSPHONATES USE: None  PAIN MANAGEMENT: No pain  NARCOTICS INDUCED CONSTIPATION: None  LIVING WILL AND CODE STATUS: Full code.   INTERVAL HISTORY: Sylvia Spoon89.o. female returns to the clinic today for followup visit accompanied by 2 of her daughters. The patient has been complaining of increasing fatigue and weakness recently. She continues to have anemia and required PRBCs transfusion twice recently. She also has dizzy spells. She denied having any significant chest pain but continues to have shortness of breath at baseline and increased  with exertion and currently on home oxygen. She also has cough productive of whitish sputum with no hemoptysis. The patient denied having any nausea or vomiting, no fever or chills. She had repeat CT scan of the chest, abdomen and pelvis performed recently and she is here for evaluation and discussion of her scan results.  MEDICAL HISTORY: Past Medical History  Diagnosis Date  . HTN (hypertension)   . Renal insufficiency   . IBS (irritable bowel syndrome)   . Anemia 06/2010  . Raynaud's syndrome   . PVD (peripheral vascular disease)   . Carotid stenosis   . Raynaud phenomenon since 1990's  . Hoarseness of voice   . PVD (peripheral vascular disease)   . History of radiation therapy 06/15/2012-07/05/2012    37.5 gray to right lung tumor    ALLERGIES:  has No Known Allergies.  MEDICATIONS:  Current Outpatient Prescriptions  Medication Sig Dispense Refill  . acetaminophen (TYLENOL) 325 MG tablet Take 2 tablets (650 mg total) by mouth 2 (two) times daily.  60 tablet  0  . alum & mag hydroxide-simeth (MAALOX/MYLANTA) 200-200-20 MG/5ML suspension Take 15 mLs by mouth every 6 (six) hours as needed for indigestion.  355 mL  0  . Ascorbic Acid (VITAMIN C PO) Take 1 tablet by mouth daily.      . Marland KitchenENICAR HCT 40-12.5 MG per tablet Take 1 tablet by mouth daily.       . felodipine (PLENDIL) 10 MG 24 hr tablet Take 10 mg by mouth daily.      . Marland KitchenERROUS SULFATE PO Take 1 tablet by mouth daily.      . fish oil-omega-3 fatty acids 1000 MG capsule Take 1 g by mouth daily.      .Marland Kitchen  folic acid (FOLVITE) 1 MG tablet Take 1 tablet (1 mg total) by mouth daily.  30 tablet  0  . Multiple Vitamin (MULTIVITAMIN WITH MINERALS) TABS tablet Take 1 tablet by mouth daily.  30 tablet  0  . polyethylene glycol (MIRALAX / GLYCOLAX) packet Take 17 g by mouth daily.      . PROCTOFOAM HC rectal foam       . VITAMIN D, CHOLECALCIFEROL, PO Take 1 tablet by mouth daily.       . Vitamin Mixture (VITAMIN E COMPLETE) CAPS Take 1  capsule by mouth daily.       . erlotinib (TARCEVA) 150 MG tablet Take 1 tablet (150 mg total) by mouth daily. Take on an empty stomach 1 hour before meals or 2 hours after.  30 tablet  2  . prochlorperazine (COMPAZINE) 10 MG tablet Take 1 tablet (10 mg total) by mouth every 6 (six) hours as needed.  30 tablet  0   No current facility-administered medications for this visit.    SURGICAL HISTORY:  Past Surgical History  Procedure Laterality Date  . Colonoscopy  02/2004 and 05/2011    last colonoscopy in April 2013 was negative by Dr. Johnson  . Hemorrhoid surgery    . Breast surgery      lumpectomy- right  . Hemorrhoid surgery    . Artery biopsy  12/30/2011    Procedure: MINOR BIOPSY TEMPORAL ARTERY;  Surgeon: Christian J Streck, MD;  Location: [facility];  Service: General;  Laterality: Right;  temoral artery biopsy -right side    REVIEW OF SYSTEMS:  Constitutional: positive for anorexia, fatigue and weight loss Eyes: negative Ears, nose, mouth, throat, and face: negative Respiratory: positive for cough, dyspnea on exertion and sputum Cardiovascular: negative Gastrointestinal: negative Genitourinary:negative Integument/breast: negative Hematologic/lymphatic: positive for Anemia Musculoskeletal:positive for muscle weakness Neurological: positive for dizziness Behavioral/Psych: positive for fatigue and sleep disturbance Endocrine: negative Allergic/Immunologic: negative   PHYSICAL EXAMINATION: General appearance: alert, cooperative, fatigued and no distress Head: Normocephalic, without obvious abnormality, atraumatic Neck: no adenopathy, no JVD, supple, symmetrical, trachea midline and thyroid not enlarged, symmetric, no tenderness/mass/nodules Lymph nodes: Cervical, supraclavicular, and axillary nodes normal. Resp: clear to auscultation bilaterally and normal percussion bilaterally Back: symmetric, no curvature. ROM normal. No CVA tenderness. Cardio: regular rate  and rhythm, S1, S2 normal, no murmur, click, rub or gallop GI: soft, non-tender; bowel sounds normal; no masses,  no organomegaly Extremities: extremities normal, atraumatic, no cyanosis or edema Neurologic: Alert and oriented X 3, normal strength and tone. Normal symmetric reflexes. Normal coordination and gait  ECOG PERFORMANCE STATUS: 2 - Symptomatic, <50% confined to bed  Blood pressure 113/36, pulse 66, temperature 97.1 F (36.2 C), temperature source Oral, weight 106 lb (48.081 kg), SpO2 89.00%.  LABORATORY DATA: Lab Results  Component Value Date   WBC 7.9 02/26/2013   HGB 7.7* 02/26/2013   HCT 22.9* 02/26/2013   MCV 90.5 02/26/2013   PLT 386 02/26/2013      Chemistry      Component Value Date/Time   NA 132* 02/26/2013 0947   NA 130* 12/05/2012 2350   K 4.7 02/26/2013 0947   K 4.6 12/05/2012 2350   CL 95* 12/05/2012 2350   CL 98 08/07/2012 1134   CO2 27 02/26/2013 0947   CO2 26 11/26/2012 0406   BUN 30.1* 02/26/2013 0947   BUN 42* 12/05/2012 2350   CREATININE 0.9 02/26/2013 0947   CREATININE 1.30* 12/05/2012 2350        Component Value Date/Time   CALCIUM 9.5 02/26/2013 0947   CALCIUM 8.9 11/26/2012 0406   ALKPHOS 90 02/26/2013 0947   ALKPHOS 66 11/26/2012 0406   AST 23 02/26/2013 0947   AST 22 11/26/2012 0406   ALT 14 02/26/2013 0947   ALT 9 11/26/2012 0406   BILITOT 0.21 02/26/2013 0947   BILITOT 0.3 11/26/2012 0406       RADIOGRAPHIC STUDIES:  Ct Chest W Contrast  02/26/2013   CLINICAL DATA:  Right-sided lung cancer.  Restaging.  EXAM: CT CHEST, ABDOMEN, AND PELVIS WITH CONTRAST  TECHNIQUE: Multidetector CT imaging of the chest, abdomen and pelvis was performed following the standard protocol during bolus administration of intravenous contrast.  CONTRAST:  7m OMNIPAQUE IOHEXOL 300 MG/ML  SOLN  COMPARISON:  CT ABD/PELVIS W CM dated 11/10/2012; CT ABD/PELVIS W CM dated 09/04/2012; NM PET IMAGE RESTAG (PS) SKULL BASE TO THIGH dated 05/18/2012  FINDINGS:   CT CHEST FINDINGS   Heterogeneous right supraclavicular lymph node measures 2.8 x 2.7 cm on image 6 of series 2, formerly 1.3 x 1.0 cm on 11/10/2012, although partially obscured on that exam due to streak artifact from the patient's large necklace. Internally heterogeneous right upper lobe mass, 4.9 x 3.9 cm on image 19 of series 2, formerly 4.3 x 4.2 cm, with significant worsening in consolidation of the right upper lobe which is now completely consolidated, and new consolidation in the right middle lobe with some associated volume loss and bronchiectasis. The mass does exert mass effect on the right upper lobe bronchus.  Loculated exudative right-sided pleural effusion within enhancement along the margins. Calcification along the right upper lobe pleural parenchymal margin.  Right upper paratracheal lymph node short axis 0.8 cm, formerly 0.5 cm. Right lower paratracheal lymph node short axis diameter 1.6 cm on image 19 of series 2, formerly 1.3 cm.  Left supraclavicular lymph node 1.0 cm, image 4 of series 2, previously region was obscured. Small bilateral axillary lymph nodes are observed. New small left pleural effusion, indeterminate for exudative versus transudative. Small to moderate pericardial effusion posteriorly. If just below the level of the carina, a lymph node adjacent to the thoracic aorta measures 1.2 cm in short axis, image 27 of series 2, previously no node visible at this location.  Adjacent to the descending thoracic aorta on image 37 of series 2, a 1.3 cm in short axis lymph node is observed, previously no lymph node seen at this location. 0.7 x 0.5 cm left upper lobe nodule, image 10 of series 5, new compared to 9/20 6/14.  Indistinct 0.9 cm nodule, left lower lobe on image 28 of series 4, new. There is a cluster of 3 new pulmonary nodules in the left lower lobe on images 40-44 of series 4, the largest measuring 11 mm in diameter on image 41 of series 4.  There is a new right lower lobe pulmonary nodule on image  31 of series 4 measuring 0.4 x 0.3 cm. The ground-glass opacity considered suspicious for synchronous low grade adenocarcinoma is stable from the prior exam, and currently shown on image 32 of series 4.  Severe emphysema noted. Prominent atherosclerosis. Calcified mitral valve.    CT ABDOMEN AND PELVIS FINDINGS  Transient hepatic attenuation difference noted in the lateral segment left hepatic lobe, image 62 of series 2.  The small cystic lesion in the tail the pancreas measuring 1.1 x 0.6 cm appears stable.  Increase in size of the right adrenal mass, currently 3.6 x 1.8  cm and formerly 2.1 x 1.3 cm. Current measurement on image 61 of series 2.  New retrocrural adenopathy, an index node short axis diameter 1.2 cm on image 53 of series 2.  Indistinct retroperitoneal adenopathy, new compared to the prior exam, with retroperitoneal rind of adenopathy measuring 1.4 cm in thickness on image 68 of series 2, and obscuring the aortopulmonary contour. A retrocaval retro peritoneal lymph node measures 1.6 cm in short axis on image 69 of series 2, and is new compared to the prior exam.  Dense aortoiliac atherosclerotic calcification is present. Urinary bladder and adjacent uterus unremarkable. Appendix partially visualized an with visualized segment appearing normal.  There is spurring of both ischial tuberosities with degenerative arthropathy of both hips. Lumbar spondylosis is observed.  IMPRESSION: 1. Extensive progression of malignancy, with enlarged primary mass and increased supraclavicular, mediastinal, periaortic, retrocrural, and retroperitoneal adenopathy. New of bilateral metastatic pulmonary nodules. New left pleural effusion, nonspecific for transudative versus exudative; similar loculated exudative right pleural effusion. Increased size of right adrenal metastatic lesion. 2. Severe emphysema with considerable atherosclerosis. 3. There is not complete consolidation of the right upper lobe and partial  consolidation in of the right middle lobe, with some degree of associated volume loss. This is in addition to the passive atelectasis from the pleural effusion. 4. Stable small cystic lesion in the tail the pancreas. Although this could be an intraductal papillary mucinous tumor, it pales in significance compared to the progressive metastatic lung cancer. It does merit comment on followup imaging. 5. Small to moderate posterior pericardial effusion.   Electronically Signed   By: Walt  Liebkemann M.D.   On: 02/26/2013 12:46   Mr Brain W Wo Contrast  03/02/2013   CLINICAL DATA:  Non-small-cell lung cancer restaging.  EXAM: MRI HEAD WITHOUT AND WITH CONTRAST  TECHNIQUE: Multiplanar, multiecho pulse sequences of the brain and surrounding structures were obtained without and with intravenous contrast.  CONTRAST:  7mL MULTIHANCE GADOBENATE DIMEGLUMINE 529 MG/ML IV SOLN  COMPARISON:  Brain MRI 05/19/2012  FINDINGS: There is no evidence of acute infarct. Prominence of the ventricles and to a lesser extent sulci is unchanged and compatible with moderate cerebral atrophy. There are new enhancing lesions which measure 7 mm in the right frontal lobe near the vertex (series 11, image 47) and 10 mm in the left parietal lobe (series 11, image 42). There is mild edema surrounding both of these lesions without significant mass effect. There is no midline shift.  Scattered foci of T2 hyperintensity within the periventricular white matter are compatible with mild chronic small vessel ischemic disease. There is no evidence of intracranial hemorrhage or extra-axial fluid collection. Prior bilateral cataract surgery is noted. Bilateral mastoid effusions are present. Major intracranial vascular flow voids are unchanged. Limited visualization of the upper cervical spine again demonstrates multilevel spondylosis with grade 1 anterolisthesis of C3 on C4.  IMPRESSION: Two new enhancing lesions in the right frontal lobe and left parietal  lobe with mild surrounding edema, consistent with metastases.   Electronically Signed   By: Allen  Grady   On: 03/02/2013 19:22    ASSESSMENT AND PLAN: This is a very pleasant 80 years old white female with metastatic non-small cell lung cancer, adenocarcinoma with negative EGFR mutation and negative ALK gene translocation status post 6 cycles of systemic chemotherapy with reduced dose carboplatin and Alimta. Her recent scan showed stable disease except for small necrotic right adrenal nodule.  This was followed by 1 cycle of maintenance chemotherapy with single   agent Alimta but was discontinued secondary to intolerance. Her recent CT scan of the chest, abdomen and pelvis showed significant disease progression. I have a lengthy discussion with the patient and her family about her current disease status and treatment options. I gave the patient the option of palliative care and hospice referral versus treatment with oral Tarceva 150 mg by mouth daily. I don't think the patient will be able to handle intravenous systemic chemotherapy. She is interested in proceeding with treatment. I will send her a prescription to [facility] outpatient pharmacy. I discussed with the patient adverse effect of Tarceva including but not limited to skin rash, diarrhea, interstitial lung disease, liver or renal dysfunction. She would like to proceed with treatment as planned. For the dizzy spells, I will order MRI of the brain to rule out brain metastasis. For the persistent anemia, I ordered several studies today to evaluate her condition including repeat CBC, iron study and ferritin as well as stool for Hemoccult. We will arrange for the patient to receive 2 units of PRBCs transfusion. She would come back for follow up visit in 2 weeks for reevaluation and management any adverse effect of her treatment. She was advised to call immediately if she has any concerning symptoms in the interval.  The patient voices  understanding of current disease status and treatment options and is in agreement with the current care plan.  All questions were answered. The patient knows to call the clinic with any problems, questions or concerns. We can certainly see the patient much sooner if necessary.  I spent 20 minutes counseling the patient face to face. The total time spent in the appointment was 30 minutes.  Disclaimer: This note was dictated with voice recognition software. Similar sounding words can inadvertently be transcribed and may not be corrected upon review.   ADDENDUM: The results of the MRI of the brain became available at the time of this dictation. I made an urgent referral to radiation oncology for treatment of her metastatic brain lesions. She would also be started on Decadron 4 mg by mouth 3 times a day and her dose will be tapered gradually during her radiotherapy.

## 2013-03-02 NOTE — Telephone Encounter (Signed)
Tarceva must be processed through [facility]

## 2013-03-02 NOTE — Progress Notes (Signed)
Blood stopped at 1805 per Adrena so that patient could go for MRI at 6:15 pm

## 2013-03-02 NOTE — Telephone Encounter (Signed)
I called in tarceva to [facility] and faxed demographics and insurance information.

## 2013-03-02 NOTE — Progress Notes (Signed)
Per Burnetta Sabin, nurse  may infuse second unit of blood at 200 ml/hr  after first 15 minutes then discontinue blood at before 6 pm  so pt can get  to MRI in [place] for 6 pm appointment.

## 2013-03-03 ENCOUNTER — Other Ambulatory Visit: Payer: Self-pay | Admitting: Internal Medicine

## 2013-03-03 DIAGNOSIS — C3490 Malignant neoplasm of unspecified part of unspecified bronchus or lung: Secondary | ICD-10-CM

## 2013-03-03 MED ORDER — DEXAMETHASONE 4 MG PO TABS: 4.0000 mg | ORAL_TABLET | Freq: Three times a day (TID) | ORAL | Status: DC

## 2013-03-03 NOTE — Patient Instructions (Signed)
Your scan showed evidence for disease progression. We discussed the treatment options including oral Tarceva.

## 2013-03-05 ENCOUNTER — Encounter: Payer: Self-pay | Admitting: *Deleted

## 2013-03-05 ENCOUNTER — Ambulatory Visit: Payer: Medicare Other

## 2013-03-05 ENCOUNTER — Telehealth: Payer: Self-pay | Admitting: *Deleted

## 2013-03-05 ENCOUNTER — Ambulatory Visit: Payer: Medicare Other | Admitting: Internal Medicine

## 2013-03-05 ENCOUNTER — Telehealth: Payer: Self-pay | Admitting: Internal Medicine

## 2013-03-05 LAB — TYPE AND SCREEN
ABO/RH(D): AB POS
Antibody Screen: NEGATIVE
UNIT DIVISION: 0
Unit division: 0

## 2013-03-05 NOTE — Telephone Encounter (Signed)
THE RESULTS WERE CALLED AND FAXED. THIS REPORT WAS GIVEN TO DR.MOHAMED.

## 2013-03-05 NOTE — Progress Notes (Signed)
RECEIVED A FAX FROM [facility] CONCERNING A NOTICE OF RX TRANSFER TO [facility].

## 2013-03-05 NOTE — Telephone Encounter (Signed)
S.w. Pt and advised on 1.21.15 @3 :30pm..the patient ok adn aware

## 2013-03-06 NOTE — Progress Notes (Signed)
Please see the Nurse Progress Note in the MD Initial Consult Encounter for this patient. 

## 2013-03-06 NOTE — Progress Notes (Signed)
Location/Histology of Brain Tumor: right frontal lobe and left parietal lobe with mild surrounding edema,consistent with metastases  Patient presented with symptoms of:  Dizzy spells,  Past or anticipated interventions, if any, per neurosurgery:   Past or anticipated interventions, if any, per medical oncology:1) Palliative radiotherapy under the care of Dr. Lisbeth Renshaw to the right upper lobe lung mass and mediastinum expected to be completed on 07/05/2012.  2) Systemic chemotherapy with carboplatin for AUC of 4 and Alimta 375 mg/M2 on 07/12/2012, status post 5 cycles. Last cycle on 10/23/12.  3) Maintenance chemotherapy with single agent Alimta 330m/M2 every 3 weeks. First cycle was on 11/16/2012.  4) Maintenance chemotherapy with single agent Alimta 3768mM2 every 3 weeks. First cycle was on 11/16/2012 discontinued secondary to intolerance.  CURRENT THERAPY: Tarceva 150 mg by mouth daily. This is expected to start in the next few days. Still  Doesn't have as yet 03/07/13  follow up 03/16/13 with AdAwilda MetroNP, Med[facility] Dose of Decadron, if applicable: 23m823mo TID  Hasn't picked up rx yet, didn't remember this medication rx written 03/03/13  Recent neurologic symptoms, if any:   Seizures: No  Headaches:  States sinus headacahes  Nausea: No  Dizziness/ataxia: dizzyness  Difficulty with hand coordination:   Focal numbness/weakness: weakness fatigue/anemia given 2 units PRBC's  Recently   Visual deficits/changes: no  Confusion/Memory deficits:   Painful bone metastases at present, if any: no  SAFETY ISSUES:  Prior radiation? Yes, right lung 06/15/12-07/05/12 37.5Gy/30f23f 2.5 Gy per fx(Metastatic non-small lung cancer,adenocarcinoma,neg. EGFR mutation,Neg, gene translocation)  Pacemaker/ICD? no  Possible current pregnancy? N/A  Is the patient on methotrexate? no  Additional Complaints / other details: wears oxygen 2 liters n/c, sats today 88-89%, no c/o sob,

## 2013-03-07 ENCOUNTER — Encounter: Payer: Self-pay | Admitting: *Deleted

## 2013-03-07 ENCOUNTER — Ambulatory Visit: Payer: Medicare Other | Admitting: Internal Medicine

## 2013-03-07 ENCOUNTER — Telehealth: Payer: Self-pay | Admitting: Medical Oncology

## 2013-03-07 ENCOUNTER — Telehealth: Payer: Self-pay | Admitting: *Deleted

## 2013-03-07 ENCOUNTER — Ambulatory Visit
Admission: RE | Admit: 2013-03-07 | Discharge: 2013-03-07 | Disposition: A | Discharge status: Home / Self Care | Payer: Medicare Other | Source: Ambulatory Visit | Attending: Radiation Oncology | Admitting: Radiation Oncology

## 2013-03-07 ENCOUNTER — Encounter: Payer: Self-pay | Admitting: Radiation Oncology

## 2013-03-07 VITALS — BP 156/60 | HR 65 | Temp 97.6°F | Resp 20 | Ht 63.0 in | Wt 105.3 lb

## 2013-03-07 DIAGNOSIS — C7931 Secondary malignant neoplasm of brain: Secondary | ICD-10-CM | POA: Insufficient documentation

## 2013-03-07 DIAGNOSIS — J90 Pleural effusion, not elsewhere classified: Secondary | ICD-10-CM | POA: Insufficient documentation

## 2013-03-07 DIAGNOSIS — M169 Osteoarthritis of hip, unspecified: Secondary | ICD-10-CM | POA: Insufficient documentation

## 2013-03-07 DIAGNOSIS — C7949 Secondary malignant neoplasm of other parts of nervous system: Secondary | ICD-10-CM

## 2013-03-07 DIAGNOSIS — I319 Disease of pericardium, unspecified: Secondary | ICD-10-CM | POA: Insufficient documentation

## 2013-03-07 DIAGNOSIS — Z923 Personal history of irradiation: Secondary | ICD-10-CM | POA: Insufficient documentation

## 2013-03-07 DIAGNOSIS — R918 Other nonspecific abnormal finding of lung field: Secondary | ICD-10-CM | POA: Insufficient documentation

## 2013-03-07 DIAGNOSIS — K869 Disease of pancreas, unspecified: Secondary | ICD-10-CM | POA: Insufficient documentation

## 2013-03-07 DIAGNOSIS — M1610 Unilateral primary osteoarthritis, unspecified hip: Secondary | ICD-10-CM | POA: Insufficient documentation

## 2013-03-07 DIAGNOSIS — D496 Neoplasm of unspecified behavior of brain: Secondary | ICD-10-CM

## 2013-03-07 DIAGNOSIS — C3410 Malignant neoplasm of upper lobe, unspecified bronchus or lung: Secondary | ICD-10-CM

## 2013-03-07 DIAGNOSIS — J438 Other emphysema: Secondary | ICD-10-CM | POA: Insufficient documentation

## 2013-03-07 DIAGNOSIS — C3490 Malignant neoplasm of unspecified part of unspecified bronchus or lung: Secondary | ICD-10-CM

## 2013-03-07 DIAGNOSIS — R42 Dizziness and giddiness: Secondary | ICD-10-CM | POA: Insufficient documentation

## 2013-03-07 DIAGNOSIS — M47817 Spondylosis without myelopathy or radiculopathy, lumbosacral region: Secondary | ICD-10-CM | POA: Insufficient documentation

## 2013-03-07 DIAGNOSIS — M47812 Spondylosis without myelopathy or radiculopathy, cervical region: Secondary | ICD-10-CM | POA: Insufficient documentation

## 2013-03-07 DIAGNOSIS — I059 Rheumatic mitral valve disease, unspecified: Secondary | ICD-10-CM | POA: Insufficient documentation

## 2013-03-07 DIAGNOSIS — R599 Enlarged lymph nodes, unspecified: Secondary | ICD-10-CM | POA: Insufficient documentation

## 2013-03-07 DIAGNOSIS — Z79899 Other long term (current) drug therapy: Secondary | ICD-10-CM | POA: Insufficient documentation

## 2013-03-07 DIAGNOSIS — G936 Cerebral edema: Secondary | ICD-10-CM | POA: Insufficient documentation

## 2013-03-07 MED ORDER — DEXAMETHASONE 4 MG PO TABS: 4.0000 mg | ORAL_TABLET | Freq: Three times a day (TID) | ORAL | Status: AC

## 2013-03-07 NOTE — Progress Notes (Signed)
Please see the Nurse Progress Note in the MD Initial Consult Encounter for this patient. 

## 2013-03-07 NOTE — Progress Notes (Signed)
Spoke with pt and family in [place] today.  No needs identified at this time.

## 2013-03-07 NOTE — Telephone Encounter (Signed)
Faxed tarceva to [facility]

## 2013-03-07 NOTE — Telephone Encounter (Signed)
Wants to repair her fillings under her crowns . Is it okay with Dr Julien Nordmann.? Note to [place].

## 2013-03-07 NOTE — Telephone Encounter (Signed)
Per [facility] , tarceva rx transferred to [facility] . Phone [phone]. I spoke to [place] and she stated the [facility] rx is still processing.She will send to pharmacy to expedite delivery. I also requested they contact Daughter Gregary Signs about delivery. Pretty notified.

## 2013-03-08 ENCOUNTER — Other Ambulatory Visit: Payer: Self-pay | Admitting: Radiation Therapy

## 2013-03-08 ENCOUNTER — Encounter: Payer: Self-pay | Admitting: *Deleted

## 2013-03-08 DIAGNOSIS — C7949 Secondary malignant neoplasm of other parts of nervous system: Principal | ICD-10-CM

## 2013-03-08 DIAGNOSIS — C7931 Secondary malignant neoplasm of brain: Secondary | ICD-10-CM

## 2013-03-08 NOTE — Progress Notes (Signed)
Pt called today.  She was confused about when she was going to get her medication.  I spoke with Beverlee Nims, Dr. Worthy Flank desk nurse.  She stated she had called yesterday and explained.  I explained that the specialty pharmacy will be contacting her.  She stated she had the phone number to the pharmacy.  I asked that she call me tomorrow if they have not called her.  She stated she would.

## 2013-03-08 NOTE — Telephone Encounter (Signed)
I left message to Dr Victorio Palm office that Dr Julien Nordmann said it was okay to proceed with fillings repair.

## 2013-03-08 NOTE — Progress Notes (Signed)
Radiation Oncology         [phone]) [phone] ________________________________  Name: Cheila Wickstrom MRN: [id number]  Date: 03/07/2013  DOB: [birth year]  Follow-Up Visit Note  CC: Lupita Raider, MD  Si Gaul, MD  Diagnosis:   Metastatic non-small cell lung cancer, adenocarcinoma  Interval Since Last Radiation:   The patient completed palliative radiation treatment to the right lung/mediastinum on 07/05/2012   Narrative:  The patient returns today for routine reevaluation.  The  patient as noted above completed palliative thoracic radiation treatment in May of 2014. She has continued on chemotherapy/systemic treatment through Dr. Arbutus Ped since that time. The patient currently plans to begin Tarceva chemotherapy in the near future. She recently was seen by medical oncology to review restaging CT scans of the chest abdomen and pelvis. Unfortunately multifocal progressive disease was seen. This led to a discussion of beginning Tarceva as the next option. The patient also has been complaining of some dizzy spells. An MRI scan of the brain was ordered and this showed 2 small intracranial lesions with associated mild surrounding edema. These findings were consistent with brain metastasis and I have been asked to see the patient today for evaluation for cranial radiation.                            ALLERGIES:  has No Known Allergies.  Meds: Current Outpatient Prescriptions  Medication Sig Dispense Refill  . acetaminophen (TYLENOL) 325 MG tablet Take 2 tablets (650 mg total) by mouth 2 (two) times daily.  60 tablet  0  . alum & mag hydroxide-simeth (MAALOX/MYLANTA) 200-200-20 MG/5ML suspension Take 15 mLs by mouth every 6 (six) hours as needed for indigestion.  355 mL  0  . Ascorbic Acid (VITAMIN C PO) Take 1 tablet by mouth daily.      Marland Kitchen BENICAR HCT 40-12.5 MG per tablet Take 1 tablet by mouth daily.       . felodipine (PLENDIL) 10 MG 24 hr tablet Take 10 mg by mouth daily.      Marland Kitchen FERROUS  SULFATE PO Take 1 tablet by mouth daily.      . fish oil-omega-3 fatty acids 1000 MG capsule Take 1 g by mouth daily.      . folic acid (FOLVITE) 1 MG tablet Take 1 tablet (1 mg total) by mouth daily.  30 tablet  0  . Multiple Vitamin (MULTIVITAMIN WITH MINERALS) TABS tablet Take 1 tablet by mouth daily.  30 tablet  0  . polyethylene glycol (MIRALAX / GLYCOLAX) packet Take 17 g by mouth daily.      Marland Kitchen PROCTOFOAM HC rectal foam       . VITAMIN D, CHOLECALCIFEROL, PO Take 1 tablet by mouth daily.       . Vitamin Mixture (VITAMIN E COMPLETE) CAPS Take 1 capsule by mouth daily.       Marland Kitchen dexamethasone (DECADRON) 4 MG tablet Take 1 tablet (4 mg total) by mouth 3 (three) times daily.  30 tablet  1  . erlotinib (TARCEVA) 150 MG tablet Take 1 tablet (150 mg total) by mouth daily. Take on an empty stomach 1 hour before meals or 2 hours after.  30 tablet  2  . prochlorperazine (COMPAZINE) 10 MG tablet Take 1 tablet (10 mg total) by mouth every 6 (six) hours as needed.  30 tablet  0   No current facility-administered medications for this encounter.    Physical Findings: The patient is in no  acute distress. Patient is alert and oriented.  height is $RemoveB'5\' 3"'MmIwbOGR$  (1.6 m) and weight is 105 lb 4.8 oz (47.764 kg). Her oral temperature is 97.6 F (36.4 C). Her blood pressure is 156/60 and her pulse is 65. Her respiration is 20 and oxygen saturation is 89%. .   General: Well-developed, in no acute distress HEENT: Normocephalic, atraumatic, right-sided supraclavicular lymphadenopathy present Cardiovascular: Regular rate and rhythm Respiratory: Decreased breath sounds on the right, greatest in the lung base GI: Soft, nontender, normal bowel sounds Extremities: No edema present   Lab Findings: Lab Results  Component Value Date   WBC 8.2 03/02/2013   HGB 6.3* 03/02/2013   HCT 18.9* 03/02/2013   MCV 91.0 03/02/2013   PLT 344 03/02/2013     Radiographic Findings: Ct Chest W Contrast  02/26/2013   CLINICAL DATA:   Right-sided lung cancer.  Restaging.  EXAM: CT CHEST, ABDOMEN, AND PELVIS WITH CONTRAST  TECHNIQUE: Multidetector CT imaging of the chest, abdomen and pelvis was performed following the standard protocol during bolus administration of intravenous contrast.  CONTRAST:  22mL OMNIPAQUE IOHEXOL 300 MG/ML  SOLN  COMPARISON:  CT ABD/PELVIS W CM dated 11/10/2012; CT ABD/PELVIS W CM dated 09/04/2012; NM PET IMAGE RESTAG (PS) SKULL BASE TO THIGH dated 05/18/2012  FINDINGS: CT CHEST FINDINGS  Heterogeneous right supraclavicular lymph node measures 2.8 x 2.7 cm on image 6 of series 2, formerly 1.3 x 1.0 cm on 11/10/2012, although partially obscured on that exam due to streak artifact from the patient's large necklace. Internally heterogeneous right upper lobe mass, 4.9 x 3.9 cm on image 19 of series 2, formerly 4.3 x 4.2 cm, with significant worsening in consolidation of the right upper lobe which is now completely consolidated, and new consolidation in the right middle lobe with some associated volume loss and bronchiectasis. The mass does exert mass effect on the right upper lobe bronchus.  Loculated exudative right-sided pleural effusion within enhancement along the margins. Calcification along the right upper lobe pleural parenchymal margin.  Right upper paratracheal lymph node short axis 0.8 cm, formerly 0.5 cm. Right lower paratracheal lymph node short axis diameter 1.6 cm on image 19 of series 2, formerly 1.3 cm.  Left supraclavicular lymph node 1.0 cm, image 4 of series 2, previously region was obscured. Small bilateral axillary lymph nodes are observed. New small left pleural effusion, indeterminate for exudative versus transudative. Small to moderate pericardial effusion posteriorly. If just below the level of the carina, a lymph node adjacent to the thoracic aorta measures 1.2 cm in short axis, image 27 of series 2, previously no node visible at this location.  Adjacent to the descending thoracic aorta on image 37 of  series 2, a 1.3 cm in short axis lymph node is observed, previously no lymph node seen at this location. 0.7 x 0.5 cm left upper lobe nodule, image 10 of series 5, new compared to 9/20 6/14.  Indistinct 0.9 cm nodule, left lower lobe on image 28 of series 4, new. There is a cluster of 3 new pulmonary nodules in the left lower lobe on images 40-44 of series 4, the largest measuring 11 mm in diameter on image 41 of series 4.  There is a new right lower lobe pulmonary nodule on image 31 of series 4 measuring 0.4 x 0.3 cm. The ground-glass opacity considered suspicious for synchronous low grade adenocarcinoma is stable from the prior exam, and currently shown on image 32 of series 4.  Severe emphysema noted. Prominent  atherosclerosis. Calcified mitral valve.  CT ABDOMEN AND PELVIS FINDINGS  Transient hepatic attenuation difference noted in the lateral segment left hepatic lobe, image 62 of series 2.  The small cystic lesion in the tail the pancreas measuring 1.1 x 0.6 cm appears stable.  Increase in size of the right adrenal mass, currently 3.6 x 1.8 cm and formerly 2.1 x 1.3 cm. Current measurement on image 61 of series 2.  New retrocrural adenopathy, an index node short axis diameter 1.2 cm on image 53 of series 2.  Indistinct retroperitoneal adenopathy, new compared to the prior exam, with retroperitoneal rind of adenopathy measuring 1.4 cm in thickness on image 68 of series 2, and obscuring the aortopulmonary contour. A retrocaval retro peritoneal lymph node measures 1.6 cm in short axis on image 69 of series 2, and is new compared to the prior exam.  Dense aortoiliac atherosclerotic calcification is present. Urinary bladder and adjacent uterus unremarkable. Appendix partially visualized an with visualized segment appearing normal.  There is spurring of both ischial tuberosities with degenerative arthropathy of both hips. Lumbar spondylosis is observed.  IMPRESSION: 1. Extensive progression of malignancy, with  enlarged primary mass and increased supraclavicular, mediastinal, periaortic, retrocrural, and retroperitoneal adenopathy. New of bilateral metastatic pulmonary nodules. New left pleural effusion, nonspecific for transudative versus exudative; similar loculated exudative right pleural effusion. Increased size of right adrenal metastatic lesion. 2. Severe emphysema with considerable atherosclerosis. 3. There is not complete consolidation of the right upper lobe and partial consolidation in of the right middle lobe, with some degree of associated volume loss. This is in addition to the passive atelectasis from the pleural effusion. 4. Stable small cystic lesion in the tail the pancreas. Although this could be an intraductal papillary mucinous tumor, it pales in significance compared to the progressive metastatic lung cancer. It does merit comment on followup imaging. 5. Small to moderate posterior pericardial effusion.   Electronically Signed   By: Herbie Baltimore M.D.   On: 02/26/2013 12:46   Mr Laqueta Jean PP Contrast  03/02/2013   CLINICAL DATA:  Non-small-cell lung cancer restaging.  EXAM: MRI HEAD WITHOUT AND WITH CONTRAST  TECHNIQUE: Multiplanar, multiecho pulse sequences of the brain and surrounding structures were obtained without and with intravenous contrast.  CONTRAST:  41mL MULTIHANCE GADOBENATE DIMEGLUMINE 529 MG/ML IV SOLN  COMPARISON:  Brain MRI 05/19/2012  FINDINGS: There is no evidence of acute infarct. Prominence of the ventricles and to a lesser extent sulci is unchanged and compatible with moderate cerebral atrophy. There are new enhancing lesions which measure 7 mm in the right frontal lobe near the vertex (series 11, image 47) and 10 mm in the left parietal lobe (series 11, image 42). There is mild edema surrounding both of these lesions without significant mass effect. There is no midline shift.  Scattered foci of T2 hyperintensity within the periventricular white matter are compatible with mild  chronic small vessel ischemic disease. There is no evidence of intracranial hemorrhage or extra-axial fluid collection. Prior bilateral cataract surgery is noted. Bilateral mastoid effusions are present. Major intracranial vascular flow voids are unchanged. Limited visualization of the upper cervical spine again demonstrates multilevel spondylosis with grade 1 anterolisthesis of C3 on C4.  IMPRESSION: Two new enhancing lesions in the right frontal lobe and left parietal lobe with mild surrounding edema, consistent with metastases.   Electronically Signed   By: Sebastian Ache   On: 03/02/2013 19:22   Ct Abdomen Pelvis W Contrast  02/26/2013   CLINICAL  DATA:  Right-sided lung cancer.  Restaging.  EXAM: CT CHEST, ABDOMEN, AND PELVIS WITH CONTRAST  TECHNIQUE: Multidetector CT imaging of the chest, abdomen and pelvis was performed following the standard protocol during bolus administration of intravenous contrast.  CONTRAST:  32mL OMNIPAQUE IOHEXOL 300 MG/ML  SOLN  COMPARISON:  CT ABD/PELVIS W CM dated 11/10/2012; CT ABD/PELVIS W CM dated 09/04/2012; NM PET IMAGE RESTAG (PS) SKULL BASE TO THIGH dated 05/18/2012  FINDINGS: CT CHEST FINDINGS  Heterogeneous right supraclavicular lymph node measures 2.8 x 2.7 cm on image 6 of series 2, formerly 1.3 x 1.0 cm on 11/10/2012, although partially obscured on that exam due to streak artifact from the patient's large necklace. Internally heterogeneous right upper lobe mass, 4.9 x 3.9 cm on image 19 of series 2, formerly 4.3 x 4.2 cm, with significant worsening in consolidation of the right upper lobe which is now completely consolidated, and new consolidation in the right middle lobe with some associated volume loss and bronchiectasis. The mass does exert mass effect on the right upper lobe bronchus.  Loculated exudative right-sided pleural effusion within enhancement along the margins. Calcification along the right upper lobe pleural parenchymal margin.  Right upper paratracheal lymph  node short axis 0.8 cm, formerly 0.5 cm. Right lower paratracheal lymph node short axis diameter 1.6 cm on image 19 of series 2, formerly 1.3 cm.  Left supraclavicular lymph node 1.0 cm, image 4 of series 2, previously region was obscured. Small bilateral axillary lymph nodes are observed. New small left pleural effusion, indeterminate for exudative versus transudative. Small to moderate pericardial effusion posteriorly. If just below the level of the carina, a lymph node adjacent to the thoracic aorta measures 1.2 cm in short axis, image 27 of series 2, previously no node visible at this location.  Adjacent to the descending thoracic aorta on image 37 of series 2, a 1.3 cm in short axis lymph node is observed, previously no lymph node seen at this location. 0.7 x 0.5 cm left upper lobe nodule, image 10 of series 5, new compared to 9/20 6/14.  Indistinct 0.9 cm nodule, left lower lobe on image 28 of series 4, new. There is a cluster of 3 new pulmonary nodules in the left lower lobe on images 40-44 of series 4, the largest measuring 11 mm in diameter on image 41 of series 4.  There is a new right lower lobe pulmonary nodule on image 31 of series 4 measuring 0.4 x 0.3 cm. The ground-glass opacity considered suspicious for synchronous low grade adenocarcinoma is stable from the prior exam, and currently shown on image 32 of series 4.  Severe emphysema noted. Prominent atherosclerosis. Calcified mitral valve.  CT ABDOMEN AND PELVIS FINDINGS  Transient hepatic attenuation difference noted in the lateral segment left hepatic lobe, image 62 of series 2.  The small cystic lesion in the tail the pancreas measuring 1.1 x 0.6 cm appears stable.  Increase in size of the right adrenal mass, currently 3.6 x 1.8 cm and formerly 2.1 x 1.3 cm. Current measurement on image 61 of series 2.  New retrocrural adenopathy, an index node short axis diameter 1.2 cm on image 53 of series 2.  Indistinct retroperitoneal adenopathy, new compared  to the prior exam, with retroperitoneal rind of adenopathy measuring 1.4 cm in thickness on image 68 of series 2, and obscuring the aortopulmonary contour. A retrocaval retro peritoneal lymph node measures 1.6 cm in short axis on image 69 of series 2, and is new  compared to the prior exam.  Dense aortoiliac atherosclerotic calcification is present. Urinary bladder and adjacent uterus unremarkable. Appendix partially visualized an with visualized segment appearing normal.  There is spurring of both ischial tuberosities with degenerative arthropathy of both hips. Lumbar spondylosis is observed.  IMPRESSION: 1. Extensive progression of malignancy, with enlarged primary mass and increased supraclavicular, mediastinal, periaortic, retrocrural, and retroperitoneal adenopathy. New of bilateral metastatic pulmonary nodules. New left pleural effusion, nonspecific for transudative versus exudative; similar loculated exudative right pleural effusion. Increased size of right adrenal metastatic lesion. 2. Severe emphysema with considerable atherosclerosis. 3. There is not complete consolidation of the right upper lobe and partial consolidation in of the right middle lobe, with some degree of associated volume loss. This is in addition to the passive atelectasis from the pleural effusion. 4. Stable small cystic lesion in the tail the pancreas. Although this could be an intraductal papillary mucinous tumor, it pales in significance compared to the progressive metastatic lung cancer. It does merit comment on followup imaging. 5. Small to moderate posterior pericardial effusion.   Electronically Signed   By: Sherryl Barters M.D.   On: 02/26/2013 12:46    Impression:    The patient has a recent finding of 2 fairly small brain metastases secondary to metastatic adenocarcinoma of the lung. She has decided to proceed with additional systemic treatment and will be starting Tarceva in the near future. I believe that she is an  appropriate for radiosurgery given these findings.  I discussed with the patient and HER-2 daughters who accompanied her today the issues surrounding this recommendation. We discussed the potential benefit of such a treatment as well as the possible side effects and risks. Given the fact that she is proceeding with additional systemic treatment, I believe that addressing the intracranial lesions certainly makes sense.    Plan:  She was introduced to our brain program navigator today. We will proceed with a SRS protocol MRI scan for treatment planning purposes. We also will make a referral to neurosurgery and scheduled the patient for a simulation in the near future.   I spent 30 minutes with the patient today, the majority of which was spent counseling the patient on the diagnosis of cancer and coordinating care.   Jodelle Gross, M.D., Ph.D.

## 2013-03-08 NOTE — Telephone Encounter (Signed)
Called [facility] and [facility] out[facility] pharmacy, neither had rx for dexamethasone,informee Dr.moody last night, it was sent to home rx to be mailed to patient, he will write new rx for patient to start now

## 2013-03-09 ENCOUNTER — Ambulatory Visit
Admission: RE | Admit: 2013-03-09 | Discharge: 2013-03-09 | Disposition: A | Discharge status: Home / Self Care | Source: Ambulatory Visit | Attending: Radiation Oncology | Admitting: Radiation Oncology

## 2013-03-09 ENCOUNTER — Telehealth: Payer: Self-pay | Admitting: *Deleted

## 2013-03-09 ENCOUNTER — Telehealth: Payer: Self-pay | Admitting: Medical Oncology

## 2013-03-09 DIAGNOSIS — C7949 Secondary malignant neoplasm of other parts of nervous system: Principal | ICD-10-CM

## 2013-03-09 DIAGNOSIS — C7931 Secondary malignant neoplasm of brain: Secondary | ICD-10-CM

## 2013-03-09 MED ORDER — GADOBENATE DIMEGLUMINE 529 MG/ML IV SOLN
9.0000 mL | Freq: Once | INTRAVENOUS | Status: AC | PRN
  Administered 2013-03-09: 9 mL via INTRAVENOUS

## 2013-03-09 NOTE — Telephone Encounter (Signed)
Note to Dr Julien Nordmann about when to start tarceva.

## 2013-03-09 NOTE — Telephone Encounter (Signed)
Pt called with questions about medication.  She was asking about side effects with Tarceva.  I explained what I have seen with my patients.  She also had questions about the dexamethasone.  I explained why she was taking medication.  She verbalize understanding.

## 2013-03-09 NOTE — Telephone Encounter (Signed)
Pt called and left message that she had some questions. I returned her call and left message.

## 2013-03-09 NOTE — Telephone Encounter (Signed)
Confirmed prescription for tarceva  with copay of $33.I  Notified Gregary Signs and left message for pt.

## 2013-03-12 ENCOUNTER — Ambulatory Visit
Admission: RE | Admit: 2013-03-12 | Discharge: 2013-03-12 | Disposition: A | Discharge status: Home / Self Care | Payer: Medicare Other | Source: Ambulatory Visit | Attending: Radiation Oncology | Admitting: Radiation Oncology

## 2013-03-12 ENCOUNTER — Other Ambulatory Visit: Payer: Self-pay | Admitting: *Deleted

## 2013-03-12 ENCOUNTER — Telehealth: Payer: Self-pay | Admitting: *Deleted

## 2013-03-12 ENCOUNTER — Other Ambulatory Visit: Payer: Self-pay | Admitting: Internal Medicine

## 2013-03-12 ENCOUNTER — Ambulatory Visit (HOSPITAL_BASED_OUTPATIENT_CLINIC_OR_DEPARTMENT_OTHER): Payer: Medicare Other

## 2013-03-12 VITALS — BP 143/50 | HR 79 | Temp 98.0°F | Resp 20

## 2013-03-12 DIAGNOSIS — C3490 Malignant neoplasm of unspecified part of unspecified bronchus or lung: Secondary | ICD-10-CM

## 2013-03-12 DIAGNOSIS — Z510 Encounter for antineoplastic radiation therapy: Secondary | ICD-10-CM | POA: Insufficient documentation

## 2013-03-12 DIAGNOSIS — C7931 Secondary malignant neoplasm of brain: Secondary | ICD-10-CM | POA: Insufficient documentation

## 2013-03-12 DIAGNOSIS — D649 Anemia, unspecified: Secondary | ICD-10-CM

## 2013-03-12 DIAGNOSIS — C7949 Secondary malignant neoplasm of other parts of nervous system: Secondary | ICD-10-CM

## 2013-03-12 LAB — FECAL OCCULT BLOOD, GUAIAC: OCCULT BLOOD: POSITIVE

## 2013-03-12 NOTE — Telephone Encounter (Signed)
Pt daughter called left me vm regarding when pt can take Tarceva.  I spoke with Dr. Julien Nordmann to clarify.  He stated pt can take Tarceva as soon as she gets medication.  I spoke with Gregary Signs and gave her that information.  She verbalized understanding.  Pt was to get labs today and missed appt.  I will place POF for schedulers to reschedule.  The daughter stated any day this week would be ok due to having help with transportation.

## 2013-03-12 NOTE — Progress Notes (Signed)
Patient gave name and dob as identification, here fot CT Simulation, not a diabetic not allergic to IV dye,  03/02/13 BUN=35.6, CR=0.9. Attempted  X 1 IV start, unsuccessful LAC, called med onc for help,left vm Sylvia Canavan, RN IV started IV site #22g 1 inch catheter right arm x 1 attempt, patient tolerated well D/c iv site rfa, 2x2  gaise dressing applied and taped, pt to leave on for 2-3 hours,if no bleeding can leave off, d/c patient home,

## 2013-03-12 NOTE — Progress Notes (Signed)
Quick Note:  Call patient with the result and needs referral to GI ______

## 2013-03-13 ENCOUNTER — Telehealth: Payer: Self-pay | Admitting: *Deleted

## 2013-03-13 NOTE — Telephone Encounter (Signed)
Spoke with Gregary Signs, informed her regarding positive blood x 3.  She asked that we inform pt and to not mention we called and spoke with her regarding this.    Spoke with patient, she asked if it was okay to see her PCP since they had originally thought she had hemrroids.  Per Dr Vista Mink, okay for her to f/u with PCP SLJ

## 2013-03-13 NOTE — Telephone Encounter (Signed)
Message copied by Britt Bottom on Tue Mar 13, 2013 10:23 AM ------      Message from: Curt Bears      Created: Mon Mar 12, 2013  9:57 PM       Call patient with the result and needs referral to GI ------

## 2013-03-14 ENCOUNTER — Telehealth: Payer: Self-pay | Admitting: Internal Medicine

## 2013-03-14 NOTE — Telephone Encounter (Signed)
lvm for pt regarding to Feb appt...mailed pt appt sched, avs and letter

## 2013-03-16 ENCOUNTER — Ambulatory Visit
Admission: RE | Admit: 2013-03-16 | Discharge: 2013-03-16 | Disposition: A | Discharge status: Home / Self Care | Payer: Medicare Other | Source: Ambulatory Visit | Attending: Radiation Oncology | Admitting: Radiation Oncology

## 2013-03-16 ENCOUNTER — Telehealth: Payer: Self-pay | Admitting: Internal Medicine

## 2013-03-16 ENCOUNTER — Other Ambulatory Visit: Payer: Self-pay | Admitting: *Deleted

## 2013-03-16 ENCOUNTER — Encounter: Payer: Self-pay | Admitting: Radiation Oncology

## 2013-03-16 ENCOUNTER — Ambulatory Visit: Payer: Medicare Other | Admitting: Physician Assistant

## 2013-03-16 ENCOUNTER — Ambulatory Visit: Payer: Medicare Other

## 2013-03-16 ENCOUNTER — Other Ambulatory Visit: Payer: Medicare Other

## 2013-03-16 ENCOUNTER — Ambulatory Visit (HOSPITAL_BASED_OUTPATIENT_CLINIC_OR_DEPARTMENT_OTHER): Payer: Medicare Other | Admitting: Physician Assistant

## 2013-03-16 ENCOUNTER — Telehealth: Payer: Self-pay | Admitting: *Deleted

## 2013-03-16 ENCOUNTER — Encounter: Payer: Self-pay | Admitting: Physician Assistant

## 2013-03-16 VITALS — BP 145/44 | HR 80 | Temp 97.6°F | Resp 18 | Ht 63.0 in | Wt 100.7 lb

## 2013-03-16 DIAGNOSIS — C3410 Malignant neoplasm of upper lobe, unspecified bronchus or lung: Secondary | ICD-10-CM

## 2013-03-16 DIAGNOSIS — R059 Cough, unspecified: Secondary | ICD-10-CM

## 2013-03-16 DIAGNOSIS — C3490 Malignant neoplasm of unspecified part of unspecified bronchus or lung: Secondary | ICD-10-CM

## 2013-03-16 DIAGNOSIS — C7949 Secondary malignant neoplasm of other parts of nervous system: Secondary | ICD-10-CM

## 2013-03-16 DIAGNOSIS — R05 Cough: Secondary | ICD-10-CM

## 2013-03-16 DIAGNOSIS — R0609 Other forms of dyspnea: Secondary | ICD-10-CM

## 2013-03-16 DIAGNOSIS — R0989 Other specified symptoms and signs involving the circulatory and respiratory systems: Secondary | ICD-10-CM

## 2013-03-16 DIAGNOSIS — C7931 Secondary malignant neoplasm of brain: Secondary | ICD-10-CM

## 2013-03-16 DIAGNOSIS — Z923 Personal history of irradiation: Secondary | ICD-10-CM

## 2013-03-16 HISTORY — DX: Personal history of irradiation: Z92.3

## 2013-03-16 NOTE — Progress Notes (Signed)
Ms. Wuebker completed [place] [facility] at 4:40pm. She states that her headache is unchanged since prior to treatment.  Denies any nausea, nor vision changes.   She is accompanied by her daughter.   Wearing oxygen at 2 liters/min vis [place].  1800 Patient to home accompanied by her daughter.  She neurological changes identified.  Ambulating with assist by daughter per her request.  Given [place] dose of Decadron per Dr. Lisbeth Renshaw.  Given script for oral thrush identified in the upper left oral cavity, tongue and throat.  Instructions given by Dr. Lisbeth Renshaw on being compliant with Decadron and taking as prescribed. VSS

## 2013-03-16 NOTE — Telephone Encounter (Signed)
Late entry 03/13/13:  Spoke with patient regarding starting Tarceva.  She states she just received her Tarceva on 03/13/13.  Advised that she begin taking it.  Scheduled for symptom mgmt f/u on 1/30 with AJ.  Informed patient that since she will have just started her Tarceva we will delay her f/u appt to 2 weeks from now.  She verbalzied understanding.  Scheduling arranged for f/u to be 2/12.     This morning (02/3013), pt's daughter called wanting to verify the appointment for today with AJ.  Called and spoke to the patient, She has no recollection that the appointment today was cancelled and does not recall our conversation about starting the [place] on 1/27 when we spoke.  She states "Dr Vista Mink did not seem very enthused about the tarceva" and "I don't want to start taking the medication until I speak with Dr Vista Mink".  Spoke with Dr Vista Mink, per Dr Vista Mink okay to r/s patient an appt for today on AJ's schedule to discuss further.  Informed patient of the appt for today, also requested to speak with pt's daughter Gregary Signs and verified appt with her as well.  SLJ

## 2013-03-16 NOTE — Patient Instructions (Addendum)
Tarceva 150 mg by mouth daily to 1 hour before or 2 hours after a meal At your request we are making a referral to [facility] palliative care Followup in 2 weeks

## 2013-03-16 NOTE — Progress Notes (Signed)
[place] [phone]   Fax:(336) [place] [address] [place] [address]  DIAGNOSIS AND STAGE: : Metastatic non-small cell lung cancer, adenocarcinoma, negative EGFR mutation, negative ALK gene translocation, presented with right upper lobe lung mass as well as mediastinal lymphadenopathy and left lower lobe lesion diagnosed in April of 2014.   PRIOR THERAPY:  1) Palliative radiotherapy under the care of Dr. Lisbeth Renshaw to the right upper lobe lung mass and mediastinum expected to be completed on 07/05/2012. 2) Systemic chemotherapy with carboplatin for AUC of 4 and Alimta 375 mg/M2 on 07/12/2012, status post 5 cycles. Last cycle on 10/23/12. 3) Maintenance chemotherapy with single agent Alimta $RemoveBefo'375mg'tBqNlCqymjI$ /M2 every 3 weeks. First cycle was on 11/16/2012. 4) Maintenance chemotherapy with single agent Alimta $RemoveBefo'375mg'jivhWwTpdFy$ /M2 every 3 weeks. First cycle was on 11/16/2012 discontinued secondary to intolerance.  CURRENT THERAPY:  1. Tarceva 150 mg by mouth daily. The patient is yet to start this medication. 2. Patient to have SRS to the 2 new brain metastatic lesions under the care Dr. Lisbeth Renshaw  CHEMOTHERAPY INTENT: palliative/maintenance CURRENT # OF CHEMOTHERAPY CYCLES: 1 CURRENT ANTIEMETICS: Zofran, dexamethasone and Compazine  CURRENT SMOKING STATUS: Nonsmoker  ORAL CHEMOTHERAPY AND CONSENT: Tarceva and the patient signed consent on 03/02/2013  CURRENT BISPHOSPHONATES USE: None  PAIN MANAGEMENT: No pain  NARCOTICS INDUCED CONSTIPATION: None  LIVING WILL AND CODE STATUS: Full code.   INTERVAL HISTORY: Yaremi Stahlman 80 y.o. female returns to the clinic today for followup visit accompanied by 1 of her daughters. She presents today to discuss starting Tarceva therapy. She is to receive the medication but has not as yet started it. She has questions about proceeding with the Tarceva therapy given the overall success  rate with this medication and her current disease state. Her daughter has concerns about the family's current ability to care for Mrs. Lesser. There has been a family discussion about engaging palliative care and the patient is amenable to this suggestion. A due to her complaints of dizzy spells an MRI of the brain was obtained and 2 new metastatic lesions were identified. The patient will undergo stereotactic radiosurgery later this afternoon of the care Dr. Lisbeth Renshaw.She denied having any significant chest pain but continues to have shortness of breath at baseline and increased with exertion and currently on home oxygen. She continues to have a cough productive of whitish sputum with no hemoptysis. The patient denied having any nausea or vomiting, no fever or chills.   MEDICAL HISTORY: Past Medical History  Diagnosis Date  . HTN (hypertension)   . Renal insufficiency   . IBS (irritable bowel syndrome)   . Anemia 06/2010  . Raynaud's syndrome   . PVD (peripheral vascular disease)   . Carotid stenosis   . Raynaud phenomenon since 1990's  . Hoarseness of voice   . PVD (peripheral vascular disease)   . History of radiation therapy 06/15/2012-07/05/2012    37.5 gray to right lung tumor    ALLERGIES:  has No Known Allergies.  MEDICATIONS:  Current Outpatient Prescriptions  Medication Sig Dispense Refill  . acetaminophen (TYLENOL) 325 MG tablet Take 2 tablets (650 mg total) by mouth 2 (two) times daily.  60 tablet  0  . alum & mag hydroxide-simeth (MAALOX/MYLANTA) 200-200-20 MG/5ML suspension Take 15 mLs by mouth every 6 (six) hours as needed for indigestion.  355 mL  0  . Ascorbic Acid (VITAMIN C PO) Take 1 tablet  by mouth daily.      Marland Kitchen BENICAR HCT 40-12.5 MG per tablet Take 1 tablet by mouth daily.       Marland Kitchen dexamethasone (DECADRON) 4 MG tablet Take 1 tablet (4 mg total) by mouth 3 (three) times daily.  30 tablet  1  . felodipine (PLENDIL) 10 MG 24 hr tablet Take 10 mg by mouth daily.      Marland Kitchen  FERROUS SULFATE PO Take 1 tablet by mouth daily.      . fish oil-omega-3 fatty acids 1000 MG capsule Take 1 g by mouth daily.      . folic acid (FOLVITE) 1 MG tablet Take 1 tablet (1 mg total) by mouth daily.  30 tablet  0  . Multiple Vitamin (MULTIVITAMIN WITH MINERALS) TABS tablet Take 1 tablet by mouth daily.  30 tablet  0  . polyethylene glycol (MIRALAX / GLYCOLAX) packet Take 17 g by mouth daily.      Marland Kitchen PROCTOFOAM HC rectal foam       . VITAMIN D, CHOLECALCIFEROL, PO Take 1 tablet by mouth daily.       . Vitamin Mixture (VITAMIN E COMPLETE) CAPS Take 1 capsule by mouth daily.       Marland Kitchen erlotinib (TARCEVA) 150 MG tablet Take 1 tablet (150 mg total) by mouth daily. Take on an empty stomach 1 hour before meals or 2 hours after.  30 tablet  2  . prochlorperazine (COMPAZINE) 10 MG tablet Take 1 tablet (10 mg total) by mouth every 6 (six) hours as needed.  30 tablet  0   No current facility-administered medications for this visit.    SURGICAL HISTORY:  Past Surgical History  Procedure Laterality Date  . Colonoscopy  02/2004 and 05/2011    last colonoscopy in April 2013 was negative by Dr. Wynetta Emery  . Hemorrhoid surgery    . Breast surgery      lumpectomy- right  . Hemorrhoid surgery    . Artery biopsy  12/30/2011    Procedure: MINOR BIOPSY TEMPORAL ARTERY;  Surgeon: Haywood Lasso, MD;  Location: [facility];  Service: General;  Laterality: Right;  temoral artery biopsy -right side    REVIEW OF SYSTEMS:  Constitutional: positive for anorexia, fatigue and weight loss Eyes: negative Ears, nose, mouth, throat, and face: negative Respiratory: positive for cough, dyspnea on exertion and sputum Cardiovascular: negative Gastrointestinal: negative Genitourinary:negative Integument/breast: negative Hematologic/lymphatic: positive for Anemia Musculoskeletal:positive for muscle weakness Neurological: positive for dizziness Behavioral/Psych: positive for fatigue and sleep  disturbance Endocrine: negative Allergic/Immunologic: negative   PHYSICAL EXAMINATION: General appearance: alert, cooperative, fatigued, no distress and On oxygen via nasal cannula with no acute respiratory distress Head: Normocephalic, without obvious abnormality, atraumatic Neck: no adenopathy, no JVD, supple, symmetrical, trachea midline and thyroid not enlarged, symmetric, no tenderness/mass/nodules Lymph nodes: Cervical, supraclavicular, and axillary nodes normal. Resp: clear to auscultation bilaterally and normal percussion bilaterally Back: symmetric, no curvature. ROM normal. No CVA tenderness. Cardio: regular rate and rhythm, S1, S2 normal, no murmur, click, rub or gallop GI: soft, non-tender; bowel sounds normal; no masses,  no organomegaly Extremities: extremities normal, atraumatic, no cyanosis or edema Neurologic: Alert and oriented X 3, normal strength and tone. Normal symmetric reflexes. Normal coordination and gait  ECOG PERFORMANCE STATUS: 2 - Symptomatic, <50% confined to bed  Blood pressure 145/44, pulse 80, temperature 97.6 F (36.4 C), temperature source Oral, resp. rate 18, height $RemoveBe'5\' 3"'qezKhrKzM$  (1.6 m), weight 100 lb 11.2 oz (45.677 kg), SpO2 100.00%.  LABORATORY DATA: Lab Results  Component Value Date   WBC 8.2 03/02/2013   HGB 6.3* 03/02/2013   HCT 18.9* 03/02/2013   MCV 91.0 03/02/2013   PLT 344 03/02/2013      Chemistry      Component Value Date/Time   NA 132* 03/02/2013 1137   NA 130* 12/05/2012 2350   K 5.1 03/02/2013 1137   K 4.6 12/05/2012 2350   CL 95* 12/05/2012 2350   CL 98 08/07/2012 1134   CO2 27 03/02/2013 1137   CO2 26 11/26/2012 0406   BUN 35.6* 03/02/2013 1137   BUN 42* 12/05/2012 2350   CREATININE 0.9 03/02/2013 1137   CREATININE 1.30* 12/05/2012 2350      Component Value Date/Time   CALCIUM 9.4 03/02/2013 1137   CALCIUM 8.9 11/26/2012 0406   ALKPHOS 93 03/02/2013 1137   ALKPHOS 66 11/26/2012 0406   AST 19 03/02/2013 1137   AST 22 11/26/2012 0406    ALT 8 03/02/2013 1137   ALT 9 11/26/2012 0406   BILITOT <0.20 03/02/2013 1137   BILITOT 0.3 11/26/2012 0406       RADIOGRAPHIC STUDIES:  Ct Chest W Contrast  02/26/2013   CLINICAL DATA:  Right-sided lung cancer.  Restaging.  EXAM: CT CHEST, ABDOMEN, AND PELVIS WITH CONTRAST  TECHNIQUE: Multidetector CT imaging of the chest, abdomen and pelvis was performed following the standard protocol during bolus administration of intravenous contrast.  CONTRAST:  56mL OMNIPAQUE IOHEXOL 300 MG/ML  SOLN  COMPARISON:  CT ABD/PELVIS W CM dated 11/10/2012; CT ABD/PELVIS W CM dated 09/04/2012; NM PET IMAGE RESTAG (PS) SKULL BASE TO THIGH dated 05/18/2012  FINDINGS:   CT CHEST FINDINGS  Heterogeneous right supraclavicular lymph node measures 2.8 x 2.7 cm on image 6 of series 2, formerly 1.3 x 1.0 cm on 11/10/2012, although partially obscured on that exam due to streak artifact from the patient's large necklace. Internally heterogeneous right upper lobe mass, 4.9 x 3.9 cm on image 19 of series 2, formerly 4.3 x 4.2 cm, with significant worsening in consolidation of the right upper lobe which is now completely consolidated, and new consolidation in the right middle lobe with some associated volume loss and bronchiectasis. The mass does exert mass effect on the right upper lobe bronchus.  Loculated exudative right-sided pleural effusion within enhancement along the margins. Calcification along the right upper lobe pleural parenchymal margin.  Right upper paratracheal lymph node short axis 0.8 cm, formerly 0.5 cm. Right lower paratracheal lymph node short axis diameter 1.6 cm on image 19 of series 2, formerly 1.3 cm.  Left supraclavicular lymph node 1.0 cm, image 4 of series 2, previously region was obscured. Small bilateral axillary lymph nodes are observed. New small left pleural effusion, indeterminate for exudative versus transudative. Small to moderate pericardial effusion posteriorly. If just below the level of the carina,  a lymph node adjacent to the thoracic aorta measures 1.2 cm in short axis, image 27 of series 2, previously no node visible at this location.  Adjacent to the descending thoracic aorta on image 37 of series 2, a 1.3 cm in short axis lymph node is observed, previously no lymph node seen at this location. 0.7 x 0.5 cm left upper lobe nodule, image 10 of series 5, new compared to 9/20 6/14.  Indistinct 0.9 cm nodule, left lower lobe on image 28 of series 4, new. There is a cluster of 3 new pulmonary nodules in the left lower lobe on images 40-44 of series 4,  the largest measuring 11 mm in diameter on image 41 of series 4.  There is a new right lower lobe pulmonary nodule on image 31 of series 4 measuring 0.4 x 0.3 cm. The ground-glass opacity considered suspicious for synchronous low grade adenocarcinoma is stable from the prior exam, and currently shown on image 32 of series 4.  Severe emphysema noted. Prominent atherosclerosis. Calcified mitral valve.    CT ABDOMEN AND PELVIS FINDINGS  Transient hepatic attenuation difference noted in the lateral segment left hepatic lobe, image 62 of series 2.  The small cystic lesion in the tail the pancreas measuring 1.1 x 0.6 cm appears stable.  Increase in size of the right adrenal mass, currently 3.6 x 1.8 cm and formerly 2.1 x 1.3 cm. Current measurement on image 61 of series 2.  New retrocrural adenopathy, an index node short axis diameter 1.2 cm on image 53 of series 2.  Indistinct retroperitoneal adenopathy, new compared to the prior exam, with retroperitoneal rind of adenopathy measuring 1.4 cm in thickness on image 68 of series 2, and obscuring the aortopulmonary contour. A retrocaval retro peritoneal lymph node measures 1.6 cm in short axis on image 69 of series 2, and is new compared to the prior exam.  Dense aortoiliac atherosclerotic calcification is present. Urinary bladder and adjacent uterus unremarkable. Appendix partially visualized an with visualized segment  appearing normal.  There is spurring of both ischial tuberosities with degenerative arthropathy of both hips. Lumbar spondylosis is observed.  IMPRESSION: 1. Extensive progression of malignancy, with enlarged primary mass and increased supraclavicular, mediastinal, periaortic, retrocrural, and retroperitoneal adenopathy. New of bilateral metastatic pulmonary nodules. New left pleural effusion, nonspecific for transudative versus exudative; similar loculated exudative right pleural effusion. Increased size of right adrenal metastatic lesion. 2. Severe emphysema with considerable atherosclerosis. 3. There is not complete consolidation of the right upper lobe and partial consolidation in of the right middle lobe, with some degree of associated volume loss. This is in addition to the passive atelectasis from the pleural effusion. 4. Stable small cystic lesion in the tail the pancreas. Although this could be an intraductal papillary mucinous tumor, it pales in significance compared to the progressive metastatic lung cancer. It does merit comment on followup imaging. 5. Small to moderate posterior pericardial effusion.   Electronically Signed   By: Sherryl Barters M.D.   On: 02/26/2013 12:46   Mr Jeri Cos EX Contrast  03/02/2013   CLINICAL DATA:  Non-small-cell lung cancer restaging.  EXAM: MRI HEAD WITHOUT AND WITH CONTRAST  TECHNIQUE: Multiplanar, multiecho pulse sequences of the brain and surrounding structures were obtained without and with intravenous contrast.  CONTRAST:  18mL MULTIHANCE GADOBENATE DIMEGLUMINE 529 MG/ML IV SOLN  COMPARISON:  Brain MRI 05/19/2012  FINDINGS: There is no evidence of acute infarct. Prominence of the ventricles and to a lesser extent sulci is unchanged and compatible with moderate cerebral atrophy. There are new enhancing lesions which measure 7 mm in the right frontal lobe near the vertex (series 11, image 47) and 10 mm in the left parietal lobe (series 11, image 42). There is mild  edema surrounding both of these lesions without significant mass effect. There is no midline shift.  Scattered foci of T2 hyperintensity within the periventricular white matter are compatible with mild chronic small vessel ischemic disease. There is no evidence of intracranial hemorrhage or extra-axial fluid collection. Prior bilateral cataract surgery is noted. Bilateral mastoid effusions are present. Major intracranial vascular flow voids are unchanged. Limited visualization  of the upper cervical spine again demonstrates multilevel spondylosis with grade 1 anterolisthesis of C3 on C4.  IMPRESSION: Two new enhancing lesions in the right frontal lobe and left parietal lobe with mild surrounding edema, consistent with metastases.   Electronically Signed   By: Logan Bores   On: 03/02/2013 19:22    ASSESSMENT AND PLAN: This is a very pleasant 80 years old white female with metastatic non-small cell lung cancer, adenocarcinoma with negative EGFR mutation and negative ALK gene translocation status post 6 cycles of systemic chemotherapy with reduced dose carboplatin and Alimta. Her recent scan showed stable disease except for small necrotic right adrenal nodule.  This was followed by 1 cycle of maintenance chemotherapy with single agent Alimta but was discontinued secondary to intolerance. The MRI of her brain showed 2 metastatic lesions and this will be treated with [facility] later this afternoon under the care of Dr. Lisbeth Renshaw. She will continue her dexamethasone taper also under Dr. Ida Rogue direction. Patient was discussed with him also seen by Dr. Julien Nordmann. He had another long discussion with the patient and her daughter regarding Tarceva therapy versus proceeding with palliative/hospice care. The patient would like to try taking the Tarceva to see how she will tolerate it but also would like to engage palliative care at this time. We will send a referral to the [facility] palliative care as this is their preference. She will  return in 2 weeks for another symptom management visit with a CBC differential and C. met to evaluate how she is tolerating the Tarceva if she would like to continue on this therapy. She was advised to call immediately if she has any concerning symptoms in the interval.  The patient voices understanding of current disease status and treatment options and is in agreement with the current care plan.  All questions were answered. The patient knows to call the clinic with any problems, questions or concerns. We can certainly see the patient much sooner if necessary.  Carlton Adam, PA-C  Disclaimer: This note was dictated with voice recognition software. Similar sounding words can inadvertently be transcribed and may not be corrected upon review.   ADDENDUM:  Hematology/Oncology Attending:  I had face to face encounter with the patient. I recommended her care plan.this is a very pleasant 80 years old white female with metastatic non-small cell lung cancer, adenocarcinoma status post systemic chemotherapy with reduced dose carboplatin and Alimta with initial response but the patient has evidence for significant disease progression on restaging scan in addition to 2 new brain metastases. She is scheduled for a stereotactic radiotherapy under the care of Dr. Lisbeth Renshaw later today. I discussed with the patient previously options for management of her condition including palliative care and hospice referral versus consideration of treatment with oral Tarceva.  The patient was interested in treatment with Tarceva and she already received the first box of her treatment but has not started the treatment. The patient came today accompanied her daughter for evaluation and discussion of her treatment options again. I went over the patient his recent scan and prognosis with him again and I offered her palliative care and hospice referral versus a trial of treatment with Tarceva. There was some kind of disagreement  between the patient and her daughter regarding what to do next. It looks like the daughter and her sisters were against any further treatment for their mother and they wanted only hospice at this point but the patient was still interested in considering treatment with oral  Tarceva. She did not mind the idea of having hospice on board at the same time. We did our best to reach an agreement between the patient and her family regarding treatment. The patient would like to continue on Tarceva for few weeks to see if she will tolerate it but at the same time she accepted hospice. We will see her back for follow up visit in 2 weeks for reevaluation and repeat blood work. She was advised to call immediately if she has any concerning symptoms in the interval.  Disclaimer: This note was dictated with voice recognition software. Similar sounding words can inadvertently be transcribed and may not be corrected upon review. Eilleen Kempf., MD 03/17/2013

## 2013-03-16 NOTE — Patient Instructions (Signed)
Slowly decrease the amount of steroid (decadron, also known as dexamethasone) you are taking: take  1 tablet three times a day for 3 days, then take 1 tablet two times a day for 1 week, then take 1 tablet one time a day for 1 week, then take 1/2 tablet one time per day for 1 week.

## 2013-03-16 NOTE — Telephone Encounter (Signed)
gv and printed appt sched and avs forpt for Feb..Marland Kitchen

## 2013-03-16 NOTE — Op Note (Signed)
  Name: [name]  MRN: [id number]  Date: 03/16/2013   DOB: 24-Apr-1926  Stereotactic Radiosurgery Operative Note  PRE-OPERATIVE DIAGNOSIS:  Multiple Brain Metastases  POST-OPERATIVE DIAGNOSIS:  Multiple Brain Metastases  PROCEDURE:  Stereotactic Radiosurgery  SURGEON:  Peggyann Shoals, MD  NARRATIVE: The patient underwent a radiation treatment planning session in the radiation oncology simulation suite under the care of the radiation oncology physician and physicist.  I participated closely in the radiation treatment planning afterwards. The patient underwent planning CT which was fused to 3T high resolution MRI with 1 mm axial slices.  These images were fused on the planning system.  We contoured the gross target volumes and subsequently expanded this to yield the Planning Target Volume. I actively participated in the planning process.  I helped to define and review the target contours and also the contours of the optic pathway, eyes, brainstem and selected nearby organs at risk.  All the dose constraints for critical structures were reviewed and compared to AAPM Task Group 101.  The prescription dose conformity was reviewed.  I approved the plan electronically.    Accordingly, Sylvia [name] was brought to the TrueBeam stereotactic radiation treatment linac and placed in the custom immobilization mask.  The patient was aligned according to the IR fiducial markers with BrainLab Exactrac, then orthogonal x-rays were used in ExacTrac with the 6DOF robotic table and the shifts were made to align the patient  Sylvia [name] received stereotactic radiosurgery uneventfully.    Lesions treated:  2   Complex lesions treated:  0 (>3.5 cm, <60mm of optic path, or within the brainstem)   The detailed description of the procedure is recorded in the radiation oncology procedure note.  I was present for the duration of the procedure.  DISPOSITION:  Following delivery, the patient was transported to nursing  in stable condition and monitored for possible acute effects to be discharged to home in stable condition with follow-up in one month.  Peggyann Shoals, MD 03/16/2013 4:54 PM

## 2013-03-16 NOTE — Telephone Encounter (Signed)
Spoke with patient this morning, she states she has not spoken to her PCP yet regarding her positive occult stool x 3.  Advised that she call her PCP to make the appt.  SLJ

## 2013-03-19 ENCOUNTER — Telehealth: Payer: Self-pay | Admitting: *Deleted

## 2013-03-19 NOTE — Progress Notes (Signed)
  Radiation Oncology         [id number]) [phone] ________________________________  Name: Sylvia [name] MRN: [id number]  Date: 03/16/2013  DOB: 17-May-1926  End of Treatment Note  Diagnosis:   Metastatic non-small cell lung cancer     Indication for treatment:  Palliative       Radiation treatment dates:   03/16/2013  Site/dose:    1.  PTV1 Lt parietal convexity 10.5mm target was treated using 3 Arcs to a prescription dose of 20 Gy. ExacTrac Snap verification was performed for each couch angle.  2.  PTV2 Rt posterior frontal vetex 63mm target was treated using 3 Arcs to a prescription dose of 20 Gy. ExacTrac Snap verification was performed for each couch angle.    Narrative: The patient tolerated radiation treatment relatively well.   She did not exhibit any acute toxicity after treatment.  Plan: The patient has completed radiation treatment. The patient will return to radiation oncology clinic for routine followup in one month. I advised the patient to call or return sooner if they have any questions or concerns related to their recovery or treatment. ________________________________  Jodelle Gross, M.D., Ph.D.

## 2013-03-19 NOTE — Progress Notes (Signed)
  Radiation Oncology         [phone]) [phone] ________________________________  Name: Sylvia [name] MRN: [id number]  Date: 03/16/2013  DOB: 1926-11-04   SPECIAL TREATMENT PROCEDURE   3D TREATMENT PLANNING AND DOSIMETRY: The patient's radiation plan was reviewed and approved by Dr. Vertell Limber from neurosurgery and radiation oncology prior to treatment. It showed 3-dimensional radiation distributions overlaid onto the planning CT/MRI image set. The [facility] for the target structures as well as the organs at risk were reviewed. The documentation of the 3D plan and dosimetry are filed in the radiation oncology EMR.   NARRATIVE: The patient was brought to the TrueBeam stereotactic radiation treatment machine and placed supine on the CT couch. The head frame was applied, and the patient was set up for stereotactic radiosurgery. Neurosurgery was present for the set-up and delivery   SIMULATION VERIFICATION: In the couch zero-angle position, the patient underwent Exactrac imaging using the Brainlab system with orthogonal KV images. These were carefully aligned and repeated to confirm treatment position for each of the isocenters. The Exactrac snap film verification was repeated at each couch angle.   SPECIAL TREATMENT PROCEDURE: The patient received stereotactic radiosurgery to the following targets:  1.  PTV1 Lt parietal convexity 10.68mm target was treated using 3 Arcs to a prescription dose of 20 Gy. ExacTrac Snap verification was performed for each couch angle.  2.  PTV2 Rt posterior frontal vetex 62mm target was treated using 3 Arcs to a prescription dose of 20 Gy. ExacTrac Snap verification was performed for each couch angle.   STEREOTACTIC TREATMENT MANAGEMENT: Following delivery, the patient was transported to nursing in stable condition and monitored for possible acute effects. Vital signs were recorded . The patient tolerated treatment without significant acute effects, and was discharged to home in stable  condition.  PLAN: Follow-up in one month.   ------------------------------------------------  Jodelle Gross, MD, PhD

## 2013-03-19 NOTE — Progress Notes (Signed)
  Radiation Oncology         [phone]) [phone] ________________________________  Name: Sylvia [name] MRN: [id number]  Date: 03/12/2013  DOB: 03/28/1926  SIMULATION AND TREATMENT PLANNING NOTE  DIAGNOSIS:  Metastatic non-small cell lung cancer  NARRATIVE:  The patient was brought to the [place] suite.  Identity was confirmed.  All relevant records and images related to the planned course of therapy were reviewed.  The patient freely provided informed written consent to proceed with treatment after reviewing the details related to the planned course of therapy. The consent form was witnessed and verified by the simulation staff. Intravenous access was established for contrast administration. Then, the patient was set-up in a stable reproducible supine position for radiation therapy.  A relocatable thermoplastic stereotactic head frame was fabricated for precise immobilization.  CT images were obtained.  Surface markings were placed.  The CT images were loaded into the planning software and fused with the patient's targeting MRI scan.  Then the target and avoidance structures were contoured.  Treatment planning then occurred.  The radiation prescription was entered and confirmed.  I have requested 3D planning  I have requested a DVH of the following structures: Brain stem, brain, left eye, right I, lenses, optic chiasm, target volumes, uninvolved brain, and normal tissue.    PLAN:  The patient will receive 20 Gy in 1 fraction to 2 new intracranial metastases.  ________________________________  Jodelle Gross, MD, PhD

## 2013-03-19 NOTE — Telephone Encounter (Signed)
Pt called and left vm message to call her.  I called and left vm message to call me back with my phone number.

## 2013-03-20 ENCOUNTER — Telehealth: Payer: Self-pay | Admitting: *Deleted

## 2013-03-20 ENCOUNTER — Emergency Department (HOSPITAL_COMMUNITY)
Admission: EM | Admit: 2013-03-20 | Discharge: 2013-03-20 | Disposition: A | Discharge status: Home / Self Care | Payer: Medicare Other | Attending: Emergency Medicine | Admitting: Emergency Medicine

## 2013-03-20 ENCOUNTER — Other Ambulatory Visit: Payer: Self-pay

## 2013-03-20 ENCOUNTER — Encounter: Payer: Self-pay | Admitting: Radiation Therapy

## 2013-03-20 ENCOUNTER — Encounter (HOSPITAL_COMMUNITY): Payer: Self-pay | Admitting: Emergency Medicine

## 2013-03-20 ENCOUNTER — Emergency Department (HOSPITAL_COMMUNITY): Payer: Medicare Other

## 2013-03-20 DIAGNOSIS — C3490 Malignant neoplasm of unspecified part of unspecified bronchus or lung: Secondary | ICD-10-CM | POA: Insufficient documentation

## 2013-03-20 DIAGNOSIS — N390 Urinary tract infection, site not specified: Secondary | ICD-10-CM | POA: Insufficient documentation

## 2013-03-20 DIAGNOSIS — Z8719 Personal history of other diseases of the digestive system: Secondary | ICD-10-CM | POA: Insufficient documentation

## 2013-03-20 DIAGNOSIS — I10 Essential (primary) hypertension: Secondary | ICD-10-CM | POA: Insufficient documentation

## 2013-03-20 DIAGNOSIS — Z79899 Other long term (current) drug therapy: Secondary | ICD-10-CM | POA: Insufficient documentation

## 2013-03-20 DIAGNOSIS — D649 Anemia, unspecified: Secondary | ICD-10-CM | POA: Insufficient documentation

## 2013-03-20 DIAGNOSIS — Z923 Personal history of irradiation: Secondary | ICD-10-CM | POA: Insufficient documentation

## 2013-03-20 DIAGNOSIS — Z87891 Personal history of nicotine dependence: Secondary | ICD-10-CM | POA: Insufficient documentation

## 2013-03-20 LAB — COMPREHENSIVE METABOLIC PANEL
ALK PHOS: 84 U/L (ref 39–117)
ALT: 14 U/L (ref 0–35)
AST: 16 U/L (ref 0–37)
Albumin: 3 g/dL — ABNORMAL LOW (ref 3.5–5.2)
BUN: 44 mg/dL — AB (ref 6–23)
CHLORIDE: 98 meq/L (ref 96–112)
CO2: 31 meq/L (ref 19–32)
Calcium: 8.7 mg/dL (ref 8.4–10.5)
Creatinine, Ser: 1.01 mg/dL (ref 0.50–1.10)
GFR calc non Af Amer: 50 mL/min — ABNORMAL LOW (ref >90)
GFR, EST AFRICAN AMERICAN: 58 mL/min — AB (ref >90)
GLUCOSE: 156 mg/dL — AB (ref 70–99)
POTASSIUM: 4.1 meq/L (ref 3.7–5.3)
Sodium: 139 mEq/L (ref 137–147)
Total Protein: 7.1 g/dL (ref 6.0–8.3)

## 2013-03-20 LAB — URINALYSIS, ROUTINE W REFLEX MICROSCOPIC
BILIRUBIN URINE: NEGATIVE
Glucose, UA: NEGATIVE mg/dL
Hgb urine dipstick: NEGATIVE
Ketones, ur: NEGATIVE mg/dL
Nitrite: NEGATIVE
PH: 5 (ref 5.0–8.0)
Protein, ur: 30 mg/dL — AB
SPECIFIC GRAVITY, URINE: 1.023 (ref 1.005–1.030)
UROBILINOGEN UA: 0.2 mg/dL (ref 0.0–1.0)

## 2013-03-20 LAB — CBC WITH DIFFERENTIAL/PLATELET
Basophils Absolute: 0 10*3/uL (ref 0.0–0.1)
Basophils Relative: 0 % (ref 0–1)
Eosinophils Absolute: 0 10*3/uL (ref 0.0–0.7)
Eosinophils Relative: 0 % (ref 0–5)
HCT: 26.6 % — ABNORMAL LOW (ref 36.0–46.0)
HEMOGLOBIN: 8.7 g/dL — AB (ref 12.0–15.0)
LYMPHS PCT: 4 % — AB (ref 12–46)
Lymphs Abs: 0.5 10*3/uL — ABNORMAL LOW (ref 0.7–4.0)
MCH: 30 pg (ref 26.0–34.0)
MCHC: 32.7 g/dL (ref 30.0–36.0)
MCV: 91.7 fL (ref 78.0–100.0)
MONOS PCT: 7 % (ref 3–12)
Monocytes Absolute: 1 10*3/uL (ref 0.1–1.0)
NEUTROS ABS: 11.9 10*3/uL — AB (ref 1.7–7.7)
NEUTROS PCT: 89 % — AB (ref 43–77)
Platelets: 422 10*3/uL — ABNORMAL HIGH (ref 150–400)
RBC: 2.9 MIL/uL — ABNORMAL LOW (ref 3.87–5.11)
RDW: 14.8 % (ref 11.5–15.5)
WBC: 13.4 10*3/uL — ABNORMAL HIGH (ref 4.0–10.5)

## 2013-03-20 LAB — OCCULT BLOOD, POC DEVICE: Fecal Occult Bld: POSITIVE — AB

## 2013-03-20 LAB — URINE MICROSCOPIC-ADD ON

## 2013-03-20 MED ORDER — SODIUM CHLORIDE 0.9 % IV BOLUS (SEPSIS)
1000.0000 mL | Freq: Once | INTRAVENOUS | Status: AC
  Administered 2013-03-20: 1000 mL via INTRAVENOUS

## 2013-03-20 MED ORDER — CEPHALEXIN 500 MG PO CAPS: 500.0000 mg | ORAL_CAPSULE | Freq: Three times a day (TID) | ORAL | Status: AC

## 2013-03-20 NOTE — Progress Notes (Signed)
Sylvia [name] called to say that she has not been feeling very well. I asked if she has a headache or nausea, and she said a slight headache. However, her main complaint is that her stomach hurts and she is very weak. She said, " I think I need a transfusion, I feel awful."    I let her know that I would communicate this to Dr. Ida Rogue nurse and that we would be getting back in touch with her.   Mont Dutton R.T.(R)(T) Radiation Special Procedures [place] [phone]  Office [phone]  Fax

## 2013-03-20 NOTE — ED Provider Notes (Signed)
CSN: [id number]     Arrival date & time 03/20/13  1159 History   First MD Initiated Contact with Patient 03/20/13 1214     Chief Complaint  Patient presents with  . Weakness   (Consider location/radiation/quality/duration/timing/severity/associated sxs/prior Treatment) HPI 80 y.o. Female with nonsmall cell lung cancer stage 4 on palliative care reports she is not undergoing any further treatment with worsening generalized weakness. She states she felt okay last night but today felt very weak and barely able to walk.  She is at baseline dyspnea and reports no change in cough, fever, or oxygen use (she is on 2l/m).  She has been taking her usual po without vomiting or diarrhea.  She states she feels like she did when her hemoglobin was low.  She does not think she has rectal bleeding.  She denie urinary symptoms.  She is taking her usual meds.  Her oncologist is Dr. Julien Nordmann and pmd is Willey Blade.  Past Medical History  Diagnosis Date  . HTN (hypertension)   . Renal insufficiency   . IBS (irritable bowel syndrome)   . Anemia 06/2010  . Raynaud's syndrome   . PVD (peripheral vascular disease)   . Carotid stenosis   . Raynaud phenomenon since 1990's  . Hoarseness of voice   . PVD (peripheral vascular disease)   . History of radiation therapy 06/15/2012-07/05/2012    37.5 gray to right lung tumor   Past Surgical History  Procedure Laterality Date  . Colonoscopy  02/2004 and 05/2011    last colonoscopy in April 2013 was negative by Dr. Wynetta Emery  . Hemorrhoid surgery    . Breast surgery      lumpectomy- right  . Hemorrhoid surgery    . Artery biopsy  12/30/2011    Procedure: MINOR BIOPSY TEMPORAL ARTERY;  Surgeon: Haywood Lasso, MD;  Location: [facility];  Service: General;  Laterality: Right;  temoral artery biopsy -right side   Family History  Problem Relation Age of Onset  . Pneumonia Mother   . Stroke Father    History  Substance Use Topics  . Smoking status:  Former Smoker -- 0.25 packs/day for 60 years    Quit date: 02/15/2009  . Smokeless tobacco: Not on file  . Alcohol Use: No   OB History   Grav Para Term Preterm Abortions TAB SAB Ect Mult Living                 Review of Systems  All other systems reviewed and are negative.    Allergies  Review of patient's allergies indicates no known allergies.  Home Medications   Current Outpatient Rx  Name  Route  Sig  Dispense  Refill  . acetaminophen (TYLENOL) 325 MG tablet   Oral   Take 2 tablets (650 mg total) by mouth 2 (two) times daily.   60 tablet   0   . alum & mag hydroxide-simeth (MAALOX/MYLANTA) 200-200-20 MG/5ML suspension   Oral   Take 15 mLs by mouth every 6 (six) hours as needed for indigestion.   355 mL   0   . Ascorbic Acid (VITAMIN C PO)   Oral   Take 1 tablet by mouth daily.         Marland Kitchen BENICAR HCT 40-12.5 MG per tablet   Oral   Take 1 tablet by mouth daily.          Marland Kitchen dexamethasone (DECADRON) 4 MG tablet   Oral   Take 1 tablet (  4 mg total) by mouth 3 (three) times daily.   30 tablet   1   . felodipine (PLENDIL) 10 MG 24 hr tablet   Oral   Take 10 mg by mouth daily.         Marland Kitchen FERROUS SULFATE PO   Oral   Take 1 tablet by mouth daily.         . fish oil-omega-3 fatty acids 1000 MG capsule   Oral   Take 1 g by mouth daily.         . folic acid (FOLVITE) 1 MG tablet   Oral   Take 1 tablet (1 mg total) by mouth daily.   30 tablet   0   . Multiple Vitamin (MULTIVITAMIN WITH MINERALS) TABS tablet   Oral   Take 1 tablet by mouth daily.   30 tablet   0   . polyethylene glycol (MIRALAX / GLYCOLAX) packet   Oral   Take 17 g by mouth daily.         Marland Kitchen PROCTOFOAM HC rectal foam               . VITAMIN D, CHOLECALCIFEROL, PO   Oral   Take 1 tablet by mouth daily.          . Vitamin Mixture (VITAMIN E COMPLETE) CAPS   Oral   Take 1 capsule by mouth daily.           BP 141/55  Pulse 69  Temp(Src) 97.3 F (36.3 C)  Resp  20  Ht 5\' 3"  (1.6 m)  Wt 100 lb (45.36 kg)  BMI 17.72 kg/m2  SpO2 100% Physical Exam  Nursing note and vitals reviewed. Constitutional: She is oriented to person, place, and time. She appears well-developed.  cachectic   HENT:  Head: Normocephalic and atraumatic.  Right Ear: External ear normal.  Left Ear: External ear normal.  Nose: Nose normal.  Mouth/Throat: Oropharynx is clear and moist.  Eyes: Conjunctivae and EOM are normal. Pupils are equal, round, and reactive to light.  Neck: Normal range of motion. Neck supple.  Cardiovascular: Normal rate, regular rhythm, normal heart sounds and intact distal pulses.   Pulmonary/Chest: Effort normal.  Abdominal: Soft. Bowel sounds are normal. There is no tenderness. There is no rebound and no guarding.  Musculoskeletal: Normal range of motion. She exhibits no edema and no tenderness.  Neurological: She is alert and oriented to person, place, and time. She has normal reflexes. She displays normal reflexes. No cranial nerve deficit. She exhibits normal muscle tone. Coordination normal.  Skin: Skin is warm and dry.  Psychiatric: She has a normal mood and affect. Her behavior is normal. Judgment and thought content normal.    ED Course  Procedures (including critical care time) Labs Review Labs Reviewed  CBC WITH DIFFERENTIAL - Abnormal; Notable for the following:    WBC 13.4 (*)    RBC 2.90 (*)    Hemoglobin 8.7 (*)    HCT 26.6 (*)    Platelets 422 (*)    Neutrophils Relative % 89 (*)    Neutro Abs 11.9 (*)    Lymphocytes Relative 4 (*)    Lymphs Abs 0.5 (*)    All other components within normal limits  COMPREHENSIVE METABOLIC PANEL - Abnormal; Notable for the following:    Glucose, Bld 156 (*)    BUN 44 (*)    Albumin 3.0 (*)    Total Bilirubin <0.2 (*)    GFR calc  non Af Amer 50 (*)    GFR calc Af Amer 58 (*)    All other components within normal limits  URINALYSIS, ROUTINE W REFLEX MICROSCOPIC - Abnormal; Notable for the  following:    APPearance CLOUDY (*)    Protein, ur 30 (*)    Leukocytes, UA MODERATE (*)    All other components within normal limits  URINE MICROSCOPIC-ADD ON - Abnormal; Notable for the following:    Bacteria, UA FEW (*)    All other components within normal limits  OCCULT BLOOD, POC DEVICE - Abnormal; Notable for the following:    Fecal Occult Bld POSITIVE (*)    All other components within normal limits  URINE CULTURE  OCCULT BLOOD X 1 CARD TO LAB, STOOL  TYPE AND SCREEN   Imaging Review No results found.  EKG Interpretation   None       MDM  1 stage IV lung cancer patient with generalized weakness who does not wish to have any further treatment and is set up to have palliative care consult per patient. 2 anemia hemoglobin is 8.7 she has been previously as well as 6. It is unlikely that this is causing her symptoms. She does Hemoccult positive here. I discussed whether or not she wishes to have further active intervention regarding this and she does not wish to have any further active intervention. 3 possible UTI patient has a white blood cell count moderate leukocytes area. She will be treated with Keflex. I have discussed treatment options with patient and family. Patient wishes to go home. I think this is reasonable giving her a stage IV lung cancer. She will follow up with her physician outpatient. She understands that she can return at any time she is feeling worse.   Shaune Pollack, MD 03/20/13 [phone]

## 2013-03-20 NOTE — Discharge Instructions (Signed)
Please take antibiotics as discussed. Discuss further active palliative care and home aid with your doctors.  Return for further evaluation if worse or you choose more active intervention.

## 2013-03-20 NOTE — ED Notes (Signed)
Pt states has been weaker than normal recently but today something felt different, states it was hard for her to get out of bed, states did shower this morning and ate a "big" breakfast, states felt weaker than normal this morning, also has a pain in R side this morning when turned over, denies pain at this time, denies feeling weaker on one side more. Pt a/o x 4, on 2L [place] at home, denies shortness of breath. Pt states she was anemic last time she felt this way.

## 2013-03-20 NOTE — Telephone Encounter (Signed)
patient home no answer, pulled patients chart in epic saw that she was in The ED,

## 2013-03-20 NOTE — ED Notes (Signed)
Patient transported to X-ray 

## 2013-03-21 ENCOUNTER — Telehealth: Payer: Self-pay | Admitting: *Deleted

## 2013-03-21 LAB — URINE CULTURE
COLONY COUNT: NO GROWTH
Culture: NO GROWTH

## 2013-03-21 NOTE — Telephone Encounter (Signed)
Called patient back , discussed tapering of dexamethasone, today start 4mg  tab 2x day for 1 week(7days), then 1 tab (4mg )2x day for 1 week(7 days), then 1/2 tab (2mg ) 1 x day for 1 week(7 days), patient stated correct tapering back, verbal understanding, thanked MD and this RN for calling back so soon, asked how she was feeling today,"so so'  11:24 AM

## 2013-03-21 NOTE — Telephone Encounter (Signed)
Patient called asking when to taper her steroids, decadron,4mg  tid taking at present, she wasn't admitted to the hospital yesterday was sent home with RX Keflex, which she hasn't picked up yet, it was for a possible UTI, informed her I would call her back after speaking with Dr.moody when he gst back in the office this am,she stated"  She wants to get off the decadron", awaiting call back 9:53 AM

## 2013-03-22 ENCOUNTER — Telehealth: Payer: Self-pay | Admitting: Medical Oncology

## 2013-03-22 NOTE — Telephone Encounter (Signed)
FYI admitted to hospice -no concerns.Dr Julien Nordmann is attending.

## 2013-03-24 LAB — TYPE AND SCREEN
ABO/RH(D): AB POS
Antibody Screen: NEGATIVE
UNIT DIVISION: 0
Unit division: 0

## 2013-03-26 ENCOUNTER — Telehealth: Payer: Self-pay | Admitting: *Deleted

## 2013-03-26 NOTE — Telephone Encounter (Signed)
Pt. Called with questions about appt.  I clarified. Pt verbalized understanding of next appt.

## 2013-03-27 ENCOUNTER — Other Ambulatory Visit: Payer: Medicare Other

## 2013-03-27 ENCOUNTER — Ambulatory Visit: Payer: Medicare Other | Admitting: Physician Assistant

## 2013-03-29 ENCOUNTER — Ambulatory Visit (HOSPITAL_BASED_OUTPATIENT_CLINIC_OR_DEPARTMENT_OTHER)

## 2013-03-29 ENCOUNTER — Other Ambulatory Visit (HOSPITAL_BASED_OUTPATIENT_CLINIC_OR_DEPARTMENT_OTHER)

## 2013-03-29 ENCOUNTER — Ambulatory Visit (HOSPITAL_BASED_OUTPATIENT_CLINIC_OR_DEPARTMENT_OTHER): Admitting: Physician Assistant

## 2013-03-29 ENCOUNTER — Ambulatory Visit: Payer: Medicare Other

## 2013-03-29 ENCOUNTER — Encounter: Payer: Self-pay | Admitting: Physician Assistant

## 2013-03-29 ENCOUNTER — Ambulatory Visit (HOSPITAL_COMMUNITY)
Admission: RE | Admit: 2013-03-29 | Discharge: 2013-03-29 | Disposition: A | Discharge status: Home / Self Care | Source: Ambulatory Visit | Attending: Physician Assistant | Admitting: Physician Assistant

## 2013-03-29 VITALS — BP 166/41 | HR 71 | Temp 97.5°F | Resp 16 | Ht 63.0 in | Wt 94.3 lb

## 2013-03-29 VITALS — BP 173/64 | HR 87 | Temp 98.7°F | Resp 18

## 2013-03-29 DIAGNOSIS — C3410 Malignant neoplasm of upper lobe, unspecified bronchus or lung: Secondary | ICD-10-CM

## 2013-03-29 DIAGNOSIS — C3490 Malignant neoplasm of unspecified part of unspecified bronchus or lung: Secondary | ICD-10-CM

## 2013-03-29 DIAGNOSIS — C7949 Secondary malignant neoplasm of other parts of nervous system: Secondary | ICD-10-CM

## 2013-03-29 DIAGNOSIS — D649 Anemia, unspecified: Secondary | ICD-10-CM | POA: Insufficient documentation

## 2013-03-29 DIAGNOSIS — C7931 Secondary malignant neoplasm of brain: Secondary | ICD-10-CM

## 2013-03-29 LAB — COMPREHENSIVE METABOLIC PANEL (CC13)
ALBUMIN: 3 g/dL — AB (ref 3.5–5.0)
ALT: 15 U/L (ref 0–55)
AST: 16 U/L (ref 5–34)
Alkaline Phosphatase: 74 U/L (ref 40–150)
Anion Gap: 9 mEq/L (ref 3–11)
BUN: 44.7 mg/dL — AB (ref 7.0–26.0)
CALCIUM: 9.1 mg/dL (ref 8.4–10.4)
CO2: 30 meq/L — AB (ref 22–29)
Chloride: 100 mEq/L (ref 98–109)
Creatinine: 1 mg/dL (ref 0.6–1.1)
Glucose: 120 mg/dl (ref 70–140)
POTASSIUM: 4.2 meq/L (ref 3.5–5.1)
Sodium: 139 mEq/L (ref 136–145)
Total Bilirubin: 0.3 mg/dL (ref 0.20–1.20)
Total Protein: 6.5 g/dL (ref 6.4–8.3)

## 2013-03-29 LAB — CBC WITH DIFFERENTIAL/PLATELET
BASO%: 0 % (ref 0.0–2.0)
BASOS ABS: 0 10*3/uL (ref 0.0–0.1)
EOS%: 0 % (ref 0.0–7.0)
Eosinophils Absolute: 0 10*3/uL (ref 0.0–0.5)
HEMATOCRIT: 24 % — AB (ref 34.8–46.6)
HEMOGLOBIN: 7.7 g/dL — AB (ref 11.6–15.9)
LYMPH#: 0.5 10*3/uL — AB (ref 0.9–3.3)
LYMPH%: 3.3 % — ABNORMAL LOW (ref 14.0–49.7)
MCH: 29.6 pg (ref 25.1–34.0)
MCHC: 32.1 g/dL (ref 31.5–36.0)
MCV: 92.3 fL (ref 79.5–101.0)
MONO#: 0.3 10*3/uL (ref 0.1–0.9)
MONO%: 2 % (ref 0.0–14.0)
NEUT%: 94.7 % — ABNORMAL HIGH (ref 38.4–76.8)
NEUTROS ABS: 14.9 10*3/uL — AB (ref 1.5–6.5)
Platelets: 257 10*3/uL (ref 145–400)
RBC: 2.6 10*6/uL — ABNORMAL LOW (ref 3.70–5.45)
RDW: 15.9 % — ABNORMAL HIGH (ref 11.2–14.5)
WBC: 15.8 10*3/uL — ABNORMAL HIGH (ref 3.9–10.3)

## 2013-03-29 LAB — PREPARE RBC (CROSSMATCH)

## 2013-03-29 MED ORDER — DIPHENHYDRAMINE HCL 25 MG PO CAPS
25.0000 mg | ORAL_CAPSULE | Freq: Once | ORAL | Status: AC
  Administered 2013-03-29: 25 mg via ORAL

## 2013-03-29 MED ORDER — DIPHENHYDRAMINE HCL 25 MG PO CAPS
ORAL_CAPSULE | ORAL | Status: AC
  Filled 2013-03-29: qty 1

## 2013-03-29 MED ORDER — ACETAMINOPHEN 325 MG PO TABS
650.0000 mg | ORAL_TABLET | Freq: Once | ORAL | Status: AC
  Administered 2013-03-29: 650 mg via ORAL

## 2013-03-29 MED ORDER — ACETAMINOPHEN 325 MG PO TABS
ORAL_TABLET | ORAL | Status: AC
  Filled 2013-03-29: qty 2

## 2013-03-29 NOTE — Progress Notes (Addendum)
[place] Telephone:([phone]) [phone]   Fax:([phone]) [place], MD [address] [place] [address]  DIAGNOSIS AND STAGE: : Metastatic non-small cell lung cancer, adenocarcinoma, negative EGFR mutation, negative ALK gene translocation, presented with right upper lobe lung mass as well as mediastinal lymphadenopathy and left lower lobe lesion diagnosed in April of 2014.   PRIOR THERAPY:  1) Palliative radiotherapy under the care of Dr. Lisbeth Renshaw to the right upper lobe lung mass and mediastinum expected to be completed on 07/05/2012. 2) Systemic chemotherapy with carboplatin for AUC of 4 and Alimta 375 mg/M2 on 07/12/2012, status post 5 cycles. Last cycle on 10/23/12. 3) Maintenance chemotherapy with single agent Alimta $RemoveBefo'375mg'GleGNwHCXGc$ /M2 every 3 weeks. First cycle was on 11/16/2012. 4) Maintenance chemotherapy with single agent Alimta $RemoveBefo'375mg'IJklpyJgteG$ /M2 every 3 weeks. First cycle was on 11/16/2012 discontinued secondary to intolerance. 5) status post SRS to 2 brain metastatic lesions under the care of Dr. Lisbeth Renshaw on 03/16/2013   CURRENT THERAPY:  Tarceva 150 mg by mouth daily. The patient decided not to start this medication.   CHEMOTHERAPY INTENT: palliative/maintenance CURRENT # OF CHEMOTHERAPY CYCLES: 1 CURRENT ANTIEMETICS: Zofran, dexamethasone and Compazine  CURRENT SMOKING STATUS: Nonsmoker  ORAL CHEMOTHERAPY AND CONSENT: Tarceva and the patient signed consent on 03/02/2013  CURRENT BISPHOSPHONATES USE: None  PAIN MANAGEMENT: No pain  NARCOTICS INDUCED CONSTIPATION: None  LIVING WILL AND CODE STATUS: Full code.   INTERVAL HISTORY: Sylvia [name] 80 y.o. female returns to the clinic today for followup visit accompanied by her son Nicole Kindred who is visiting from [place]. She tells me that she decided not to start [facility] therapy. She has instead proceeded with hospice care. She reports intermittent nosebleeds. She had one this  morning involving her left nares that lasted for about 15 minutes. Tend to be occurring in the morning after she is moving about. She does wear her oxygen 24 hours a day. The oxygen is currently not humidified.She denied having any significant chest pain but continues to have shortness of breath at baseline and increased with exertion and currently on home oxygen. She continues to have a cough productive of whitish sputum with no hemoptysis. The patient denied having any nausea or vomiting, no fever or chills.   MEDICAL HISTORY: Past Medical History  Diagnosis Date  . HTN (hypertension)   . Renal insufficiency   . IBS (irritable bowel syndrome)   . Anemia 06/2010  . Raynaud's syndrome   . PVD (peripheral vascular disease)   . Carotid stenosis   . Raynaud phenomenon since 1990's  . Hoarseness of voice   . PVD (peripheral vascular disease)   . History of radiation therapy 06/15/2012-07/05/2012    37.5 gray to right lung tumor    ALLERGIES:  has No Known Allergies.  MEDICATIONS:  Current Outpatient Prescriptions  Medication Sig Dispense Refill  . acetaminophen (TYLENOL) 325 MG tablet Take 2 tablets (650 mg total) by mouth 2 (two) times daily.  60 tablet  0  . alum & mag hydroxide-simeth (MAALOX/MYLANTA) 200-200-20 MG/5ML suspension Take 15 mLs by mouth every 6 (six) hours as needed for indigestion.  355 mL  0  . Ascorbic Acid (VITAMIN C PO) Take 1 tablet by mouth daily.      Marland Kitchen BENICAR HCT 40-12.5 MG per tablet Take 1 tablet by mouth daily.       . cephALEXin (KEFLEX) 500 MG capsule Take 1 capsule (500 mg total) by mouth  3 (three) times daily.  20 capsule  0  . felodipine (PLENDIL) 10 MG 24 hr tablet Take 10 mg by mouth daily.      Marland Kitchen FERROUS SULFATE PO Take 1 tablet by mouth daily.      . fish oil-omega-3 fatty acids 1000 MG capsule Take 1 g by mouth daily.      . folic acid (FOLVITE) 1 MG tablet Take 1 tablet (1 mg total) by mouth daily.  30 tablet  0  . Multiple Vitamin (MULTIVITAMIN WITH  MINERALS) TABS tablet Take 1 tablet by mouth daily.  30 tablet  0  . VITAMIN D, CHOLECALCIFEROL, PO Take 1 tablet by mouth daily.       . Vitamin Mixture (VITAMIN E COMPLETE) CAPS Take 1 capsule by mouth daily.       Marland Kitchen dexamethasone (DECADRON) 4 MG tablet Take 1 tablet (4 mg total) by mouth 3 (three) times daily.  30 tablet  1  . polyethylene glycol (MIRALAX / GLYCOLAX) packet Take 17 g by mouth daily.      Marland Kitchen PROCTOFOAM HC rectal foam        No current facility-administered medications for this visit.    SURGICAL HISTORY:  Past Surgical History  Procedure Laterality Date  . Colonoscopy  02/2004 and 05/2011    last colonoscopy in April 2013 was negative by Dr. Wynetta Emery  . Hemorrhoid surgery    . Breast surgery      lumpectomy- right  . Hemorrhoid surgery    . Artery biopsy  12/30/2011    Procedure: MINOR BIOPSY TEMPORAL ARTERY;  Surgeon: Haywood Lasso, MD;  Location: [facility];  Service: General;  Laterality: Right;  temoral artery biopsy -right side    REVIEW OF SYSTEMS:  Constitutional: positive for anorexia, fatigue and weight loss Eyes: negative Ears, nose, mouth, throat, and face: negative Respiratory: positive for cough, dyspnea on exertion and sputum Cardiovascular: negative Gastrointestinal: negative Genitourinary:negative Integument/breast: negative Hematologic/lymphatic: positive for Anemia Musculoskeletal:positive for muscle weakness Neurological: positive for dizziness Behavioral/Psych: positive for fatigue and sleep disturbance Endocrine: negative Allergic/Immunologic: negative   PHYSICAL EXAMINATION: General appearance: alert, cooperative, fatigued, no distress and On oxygen via nasal cannula with no acute respiratory distress Head: Normocephalic, without obvious abnormality, atraumatic Neck: no adenopathy, no JVD, supple, symmetrical, trachea midline and thyroid not enlarged, symmetric, no tenderness/mass/nodules Lymph nodes: Cervical,  supraclavicular, and axillary nodes normal. Resp: clear to auscultation bilaterally and normal percussion bilaterally Back: symmetric, no curvature. ROM normal. No CVA tenderness. Cardio: regular rate and rhythm, S1, S2 normal, no murmur, click, rub or gallop GI: soft, non-tender; bowel sounds normal; no masses,  no organomegaly Extremities: extremities normal, atraumatic, no cyanosis or edema Neurologic: Alert and oriented X 3, normal strength and tone. Normal symmetric reflexes. Normal coordination and gait  ECOG PERFORMANCE STATUS: 2 - Symptomatic, <50% confined to bed  Blood pressure 166/41, pulse 71, temperature 97.5 F (36.4 C), temperature source Oral, resp. rate 16, height $RemoveBe'5\' 3"'RmzwQCpGu$  (1.6 m), weight 94 lb 4.8 oz (42.774 kg), SpO2 100.00%, peak flow 2 L/min.  LABORATORY DATA: Lab Results  Component Value Date   WBC 15.8* 03/29/2013   HGB 7.7* 03/29/2013   HCT 24.0* 03/29/2013   MCV 92.3 03/29/2013   PLT 257 03/29/2013      Chemistry      Component Value Date/Time   NA 139 03/29/2013 1107   NA 139 03/20/2013 1304   K 4.2 03/29/2013 1107   K 4.1 03/20/2013 1304  CL 98 03/20/2013 1304   CL 98 08/07/2012 1134   CO2 30* 03/29/2013 1107   CO2 31 03/20/2013 1304   BUN 44.7* 03/29/2013 1107   BUN 44* 03/20/2013 1304   CREATININE 1.0 03/29/2013 1107   CREATININE 1.01 03/20/2013 1304      Component Value Date/Time   CALCIUM 9.1 03/29/2013 1107   CALCIUM 8.7 03/20/2013 1304   ALKPHOS 74 03/29/2013 1107   ALKPHOS 84 03/20/2013 1304   AST 16 03/29/2013 1107   AST 16 03/20/2013 1304   ALT 15 03/29/2013 1107   ALT 14 03/20/2013 1304   BILITOT 0.30 03/29/2013 1107   BILITOT <0.2* 03/20/2013 1304       RADIOGRAPHIC STUDIES:  Ct Chest W Contrast  02/26/2013   CLINICAL DATA:  Right-sided lung cancer.  Restaging.  EXAM: CT CHEST, ABDOMEN, AND PELVIS WITH CONTRAST  TECHNIQUE: Multidetector CT imaging of the chest, abdomen and pelvis was performed following the standard protocol during bolus administration of  intravenous contrast.  CONTRAST:  21mL OMNIPAQUE IOHEXOL 300 MG/ML  SOLN  COMPARISON:  CT ABD/PELVIS W CM dated 11/10/2012; CT ABD/PELVIS W CM dated 09/04/2012; NM PET IMAGE RESTAG (PS) SKULL BASE TO THIGH dated 05/18/2012  FINDINGS:   CT CHEST FINDINGS  Heterogeneous right supraclavicular lymph node measures 2.8 x 2.7 cm on image 6 of series 2, formerly 1.3 x 1.0 cm on 11/10/2012, although partially obscured on that exam due to streak artifact from the patient's large necklace. Internally heterogeneous right upper lobe mass, 4.9 x 3.9 cm on image 19 of series 2, formerly 4.3 x 4.2 cm, with significant worsening in consolidation of the right upper lobe which is now completely consolidated, and new consolidation in the right middle lobe with some associated volume loss and bronchiectasis. The mass does exert mass effect on the right upper lobe bronchus.  Loculated exudative right-sided pleural effusion within enhancement along the margins. Calcification along the right upper lobe pleural parenchymal margin.  Right upper paratracheal lymph node short axis 0.8 cm, formerly 0.5 cm. Right lower paratracheal lymph node short axis diameter 1.6 cm on image 19 of series 2, formerly 1.3 cm.  Left supraclavicular lymph node 1.0 cm, image 4 of series 2, previously region was obscured. Small bilateral axillary lymph nodes are observed. New small left pleural effusion, indeterminate for exudative versus transudative. Small to moderate pericardial effusion posteriorly. If just below the level of the carina, a lymph node adjacent to the thoracic aorta measures 1.2 cm in short axis, image 27 of series 2, previously no node visible at this location.  Adjacent to the descending thoracic aorta on image 37 of series 2, a 1.3 cm in short axis lymph node is observed, previously no lymph node seen at this location. 0.7 x 0.5 cm left upper lobe nodule, image 10 of series 5, new compared to 9/20 6/14.  Indistinct 0.9 cm nodule, left lower lobe  on image 28 of series 4, new. There is a cluster of 3 new pulmonary nodules in the left lower lobe on images 40-44 of series 4, the largest measuring 11 mm in diameter on image 41 of series 4.  There is a new right lower lobe pulmonary nodule on image 31 of series 4 measuring 0.4 x 0.3 cm. The ground-glass opacity considered suspicious for synchronous low grade adenocarcinoma is stable from the prior exam, and currently shown on image 32 of series 4.  Severe emphysema noted. Prominent atherosclerosis. Calcified mitral valve.    CT ABDOMEN  AND PELVIS FINDINGS  Transient hepatic attenuation difference noted in the lateral segment left hepatic lobe, image 62 of series 2.  The small cystic lesion in the tail the pancreas measuring 1.1 x 0.6 cm appears stable.  Increase in size of the right adrenal mass, currently 3.6 x 1.8 cm and formerly 2.1 x 1.3 cm. Current measurement on image 61 of series 2.  New retrocrural adenopathy, an index node short axis diameter 1.2 cm on image 53 of series 2.  Indistinct retroperitoneal adenopathy, new compared to the prior exam, with retroperitoneal rind of adenopathy measuring 1.4 cm in thickness on image 68 of series 2, and obscuring the aortopulmonary contour. A retrocaval retro peritoneal lymph node measures 1.6 cm in short axis on image 69 of series 2, and is new compared to the prior exam.  Dense aortoiliac atherosclerotic calcification is present. Urinary bladder and adjacent uterus unremarkable. Appendix partially visualized an with visualized segment appearing normal.  There is spurring of both ischial tuberosities with degenerative arthropathy of both hips. Lumbar spondylosis is observed.  IMPRESSION: 1. Extensive progression of malignancy, with enlarged primary mass and increased supraclavicular, mediastinal, periaortic, retrocrural, and retroperitoneal adenopathy. New of bilateral metastatic pulmonary nodules. New left pleural effusion, nonspecific for transudative versus  exudative; similar loculated exudative right pleural effusion. Increased size of right adrenal metastatic lesion. 2. Severe emphysema with considerable atherosclerosis. 3. There is not complete consolidation of the right upper lobe and partial consolidation in of the right middle lobe, with some degree of associated volume loss. This is in addition to the passive atelectasis from the pleural effusion. 4. Stable small cystic lesion in the tail the pancreas. Although this could be an intraductal papillary mucinous tumor, it pales in significance compared to the progressive metastatic lung cancer. It does merit comment on followup imaging. 5. Small to moderate posterior pericardial effusion.   Electronically Signed   By: Sherryl Barters M.D.   On: 02/26/2013 12:46   Mr Jeri Cos FB Contrast  03/02/2013   CLINICAL DATA:  Non-small-cell lung cancer restaging.  EXAM: MRI HEAD WITHOUT AND WITH CONTRAST  TECHNIQUE: Multiplanar, multiecho pulse sequences of the brain and surrounding structures were obtained without and with intravenous contrast.  CONTRAST:  20mL MULTIHANCE GADOBENATE DIMEGLUMINE 529 MG/ML IV SOLN  COMPARISON:  Brain MRI 05/19/2012  FINDINGS: There is no evidence of acute infarct. Prominence of the ventricles and to a lesser extent sulci is unchanged and compatible with moderate cerebral atrophy. There are new enhancing lesions which measure 7 mm in the right frontal lobe near the vertex (series 11, image 47) and 10 mm in the left parietal lobe (series 11, image 42). There is mild edema surrounding both of these lesions without significant mass effect. There is no midline shift.  Scattered foci of T2 hyperintensity within the periventricular white matter are compatible with mild chronic small vessel ischemic disease. There is no evidence of intracranial hemorrhage or extra-axial fluid collection. Prior bilateral cataract surgery is noted. Bilateral mastoid effusions are present. Major intracranial vascular  flow voids are unchanged. Limited visualization of the upper cervical spine again demonstrates multilevel spondylosis with grade 1 anterolisthesis of C3 on C4.  IMPRESSION: Two new enhancing lesions in the right frontal lobe and left parietal lobe with mild surrounding edema, consistent with metastases.   Electronically Signed   By: Logan Bores   On: 03/02/2013 19:22    ASSESSMENT AND PLAN: This is a very pleasant 80 years old white female with metastatic non-small  cell lung cancer, adenocarcinoma with negative EGFR mutation and negative ALK gene translocation status post 6 cycles of systemic chemotherapy with reduced dose carboplatin and Alimta. Her recent scan showed stable disease except for small necrotic right adrenal nodule.  This was followed by 1 cycle of maintenance chemotherapy with single agent Alimta but was discontinued secondary to intolerance. The MRI of her brain showed 2 metastatic lesions and this was  treated with SRS under the care of Dr. Lisbeth Renshaw on March 16, 2013. She will continue her dexamethasone taper also under Dr. Ida Rogue direction. Patient was discussed with and also seen by Dr. Julien Nordmann. Her hemoglobin is down to 7.7 g/dL and she is symptomatic from this. We'll arrange to transfuse her total of 2 units packed red blood cells today to address this level of anemia. We encouraged the patient to look into the humidified oxygen and this may help relieve some of the episodes of epistaxis she is experiencing. Patient brought in her healthcare par return in living Will documents and these were taken to our medical records department for filing in her chart. She was advised to call immediately if she has any concerning symptoms in the interval.  The patient voices understanding of current disease status and treatment options and is in agreement with the current care plan.  All questions were answered. The patient knows to call the clinic with any problems, questions or concerns. We can  certainly see the patient much sooner if necessary.  Carlton Adam PA-C  ADDENDUM:  Hematology/Oncology Attending:  I had a face to face encounter with the patient. I recommended her care plan. This is a very pleasant 80 years old white female with metastatic non-small cell lung cancer, adenocarcinoma status post systemic chemotherapy with carboplatin and Alimta with partial response. The patient and was also found to have metastatic brain lesion and she was treated with stereotactic under the care of Dr. Lisbeth Renshaw. She is expected to start treatment with Tarceva 2 weeks ago but the patient is still hesitant on starting her treatment. She is here today for evaluation accompanied by her son. I have a lengthy discussion with the patient today about her condition. I strongly recommended for the patient to consider palliative care and hospice referral at this point. I also recommended for the patient not to start treatment with Tarceva if she decided to continue his hospice. She is in agreement with the current plan. She will followup with me on as-needed basis. For the persistent anemia, I will arrange for the patient to receive 2 units of PRBCs transfusion today. She was advised to call immediately if she has any concerning symptoms.  Disclaimer: This note was dictated with voice recognition software. Similar sounding words can inadvertently be transcribed and may not be corrected upon review. Eilleen Kempf., MD 04/01/2013

## 2013-03-29 NOTE — Patient Instructions (Signed)
Blood Transfusion  A blood transfusion replaces your blood or some of its parts. Blood is replaced when you have lost blood because of surgery, an accident, or for severe blood conditions like anemia. You can donate blood to be used on yourself if you have a planned surgery. If you lose blood during that surgery, your own blood can be given back to you. Any blood given to you is checked to make sure it matches your blood type. Your temperature, blood pressure, and heart rate (vital signs) will be checked often.  GET HELP RIGHT AWAY IF:   You feel sick to your stomach (nauseous) or throw up (vomit).  You have watery poop (diarrhea).  You have shortness of breath or trouble breathing.  You have blood in your pee (urine) or have dark colored pee.  You have chest pain or tightness.  Your eyes or skin turn yellow (jaundice).  You have a temperature by mouth above 102 F (38.9 C), not controlled by medicine.  You start to shake and have chills.  You develop a a red rash (hives) or feel itchy.  You develop lightheadedness or feel confused.  You develop back, joint, or muscle pain.  You do not feel hungry (lost appetite).  You feel tired, restless, or nervous.  You develop belly (abdominal) cramps. Document Released: 04/30/2008 Document Revised: 04/26/2011 Document Reviewed: 04/30/2008 [employer] Patient Information 2014 [employer].  

## 2013-03-29 NOTE — Patient Instructions (Addendum)
Consider humidified oxygen You will receive 2 units of blood today to address your symptomatic anemia

## 2013-03-30 ENCOUNTER — Telehealth: Payer: Self-pay | Admitting: Medical Oncology

## 2013-03-30 ENCOUNTER — Ambulatory Visit: Payer: Medicare Other

## 2013-03-30 LAB — TYPE AND SCREEN
ABO/RH(D): AB POS
ANTIBODY SCREEN: NEGATIVE
UNIT DIVISION: 0
Unit division: 0

## 2013-03-30 NOTE — Telephone Encounter (Signed)
Called [facility] and discontinued tarceva.I spoke with the patient about this. To [employer].

## 2013-04-12 ENCOUNTER — Encounter: Payer: Self-pay | Admitting: Radiation Oncology

## 2013-04-16 ENCOUNTER — Ambulatory Visit: Admission: RE | Admit: 2013-04-16 | Payer: Medicare Other | Source: Ambulatory Visit | Admitting: Radiation Oncology

## 2013-04-23 ENCOUNTER — Ambulatory Visit: Payer: Medicare Other | Admitting: Radiation Oncology

## 2013-04-25 ENCOUNTER — Telehealth: Payer: Self-pay | Admitting: *Deleted

## 2013-04-25 ENCOUNTER — Ambulatory Visit: Admission: RE | Admit: 2013-04-25 | Source: Ambulatory Visit | Admitting: Radiation Oncology

## 2013-04-25 NOTE — Telephone Encounter (Signed)
Called Sylvia [name], patients daughter asked if patient was coming for her f/u ap appt today, "No, I thought she would have called you and cancelled""In the future any appts, pleas call her other daughter, Gust Rung at [phone], asked if she wanted [place] to call and make another appt for the patient, ? "No, I'll call them and have them call you" 11:02 AM

## 2013-04-30 ENCOUNTER — Other Ambulatory Visit: Payer: Self-pay

## 2013-05-10 ENCOUNTER — Encounter: Payer: Self-pay | Admitting: Radiation Oncology

## 2013-05-10 NOTE — Progress Notes (Signed)
Faxed attending physician's form to patient's daughter Henrietta Dine) Nena Polio [phone])

## 2013-05-25 ENCOUNTER — Other Ambulatory Visit: Payer: Self-pay | Admitting: *Deleted

## 2013-05-29 ENCOUNTER — Telehealth: Payer: Self-pay | Admitting: Internal Medicine

## 2013-05-29 NOTE — Telephone Encounter (Signed)
per 4/10 pof - pt expired 06/11/2013.

## 2013-06-15 DEATH — deceased

## 2013-08-07 ENCOUNTER — Ambulatory Visit: Payer: Medicare Other | Admitting: Interventional Cardiology

## 2013-10-09 ENCOUNTER — Other Ambulatory Visit: Payer: Self-pay | Admitting: Pharmacist

## 2014-04-21 IMAGING — CT CT ABD-PELV W/ CM
2 of 5 series · 16 of 46 positions shown, 18 images · IV contrast (OMNIPAQUE)
Comparison: PET 05/18/2012 and CT chest 05/05/2012.  CT abdomen
pelvis 08/15/2008.

CT CHEST

CLINICAL DATA: Lung cancer.  Ongoing chemotherapy.  Radiation
therapy complete.

CT CHEST, ABDOMEN AND PELVIS WITH CONTRAST
TECHNIQUE: Multidetector CT imaging of the chest, abdomen and
pelvis was performed following the standard protocol during bolus
administration of intravenous contrast.
Contrast: 80mL OMNIPAQUE IOHEXOL 300 MG/ML  SOLN

[Series 2: cap with st · axial · 0.73mm/px · z∈[-526,-36]mm · 13 of 113 slices shown, 15 images]
[im 8/113  soft-tissue]
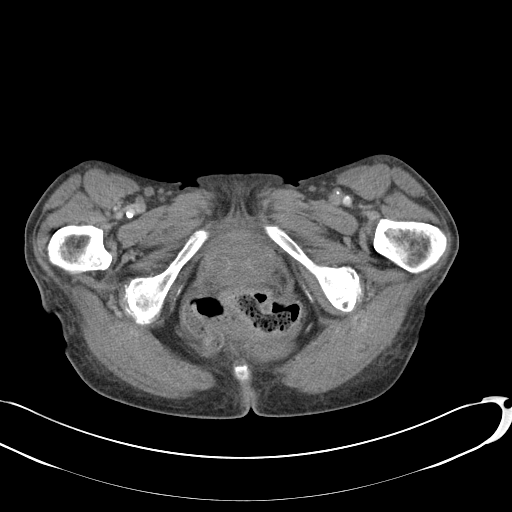
[im 8/113  bone]
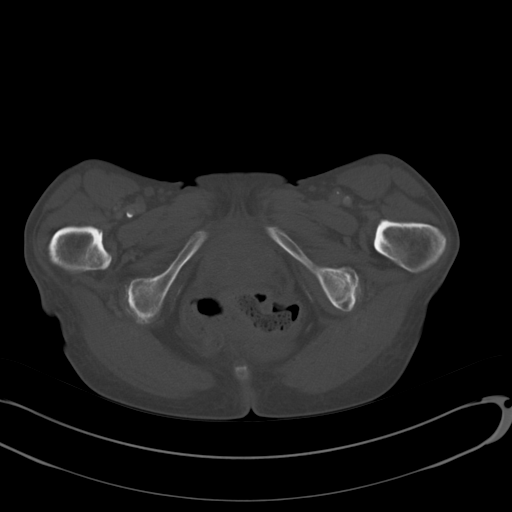
[im 15/113  soft-tissue]
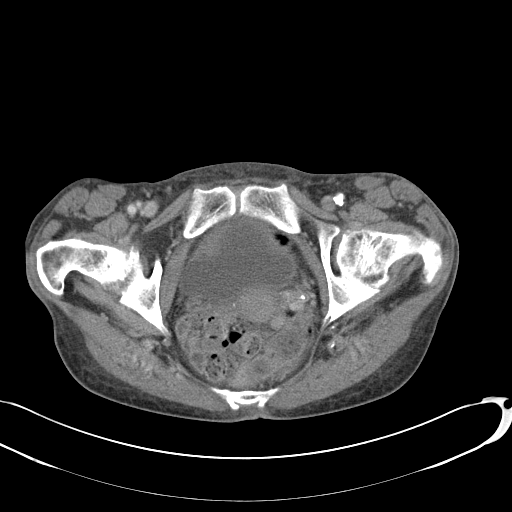
[im 22/113  soft-tissue]
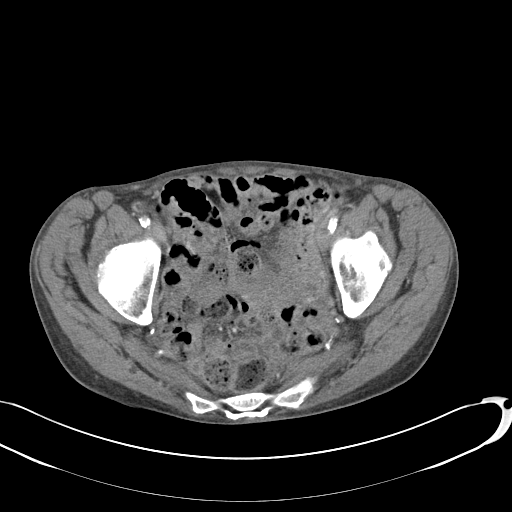
[im 36/113  soft-tissue]
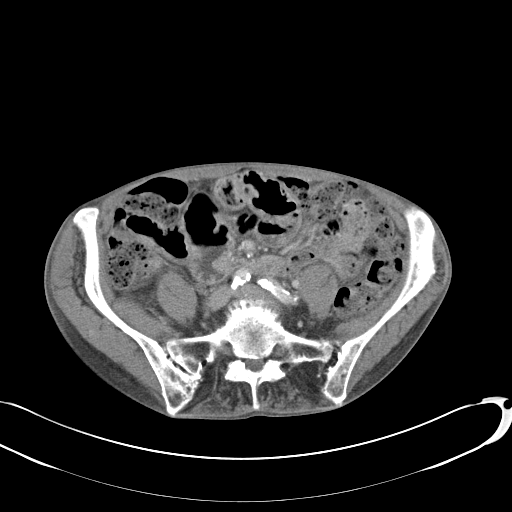
[im 43/113  soft-tissue]
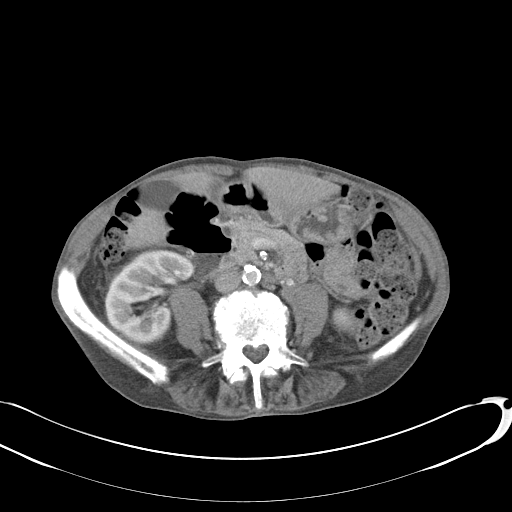
[im 50/113  soft-tissue]
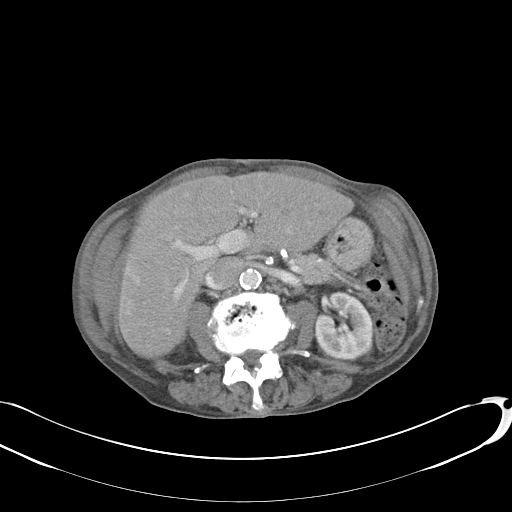
[im 57/113  soft-tissue]
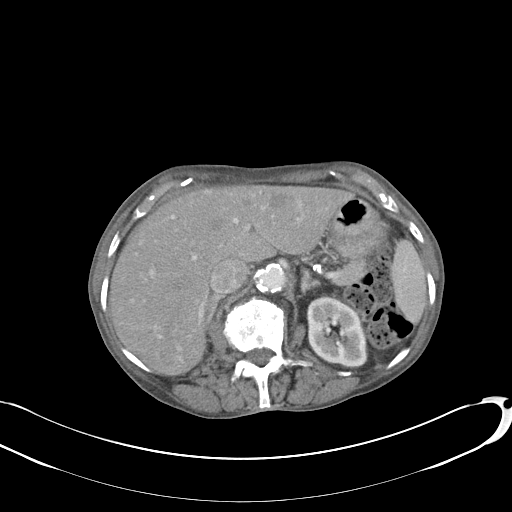
[im 64/113  soft-tissue]
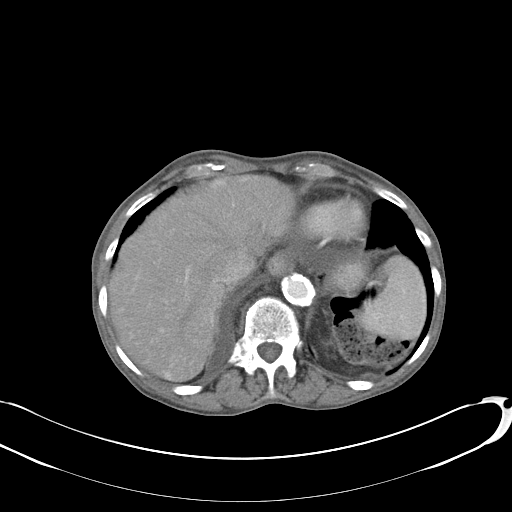
[im 71/113  soft-tissue]
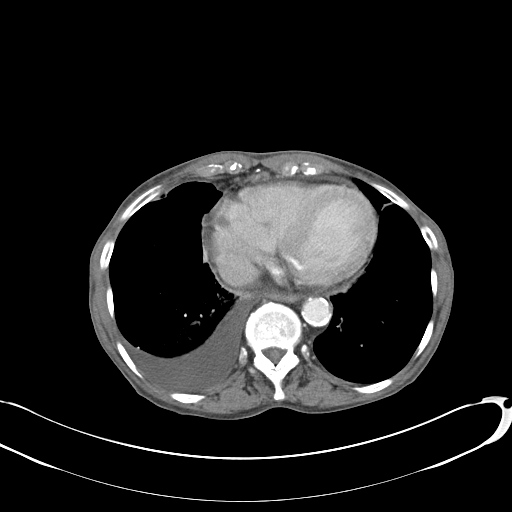
[im 71/113  bone]
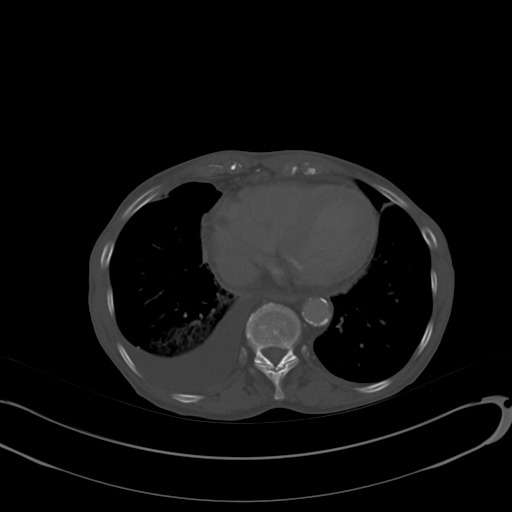
[im 78/113  soft-tissue]
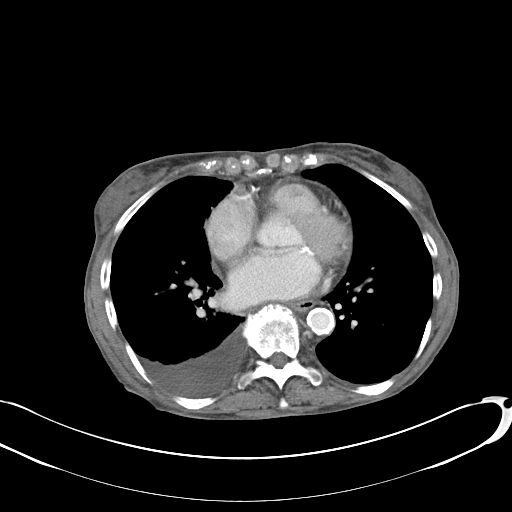
[im 92/113  soft-tissue]
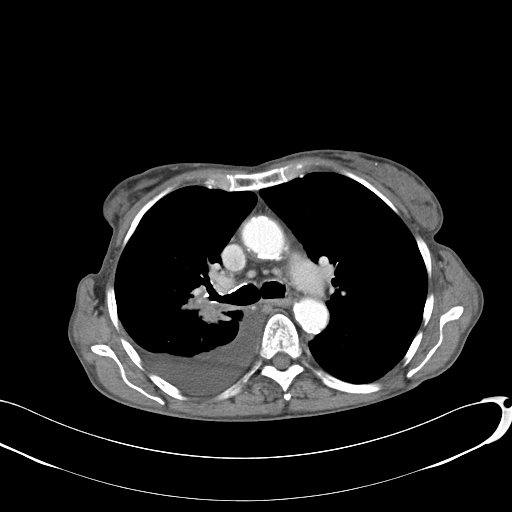
[im 99/113  soft-tissue]
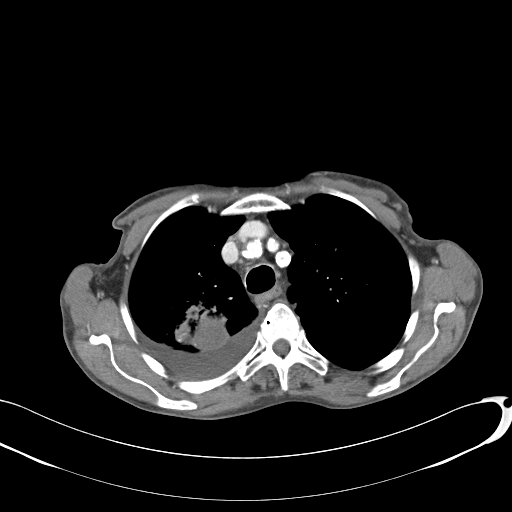
[im 106/113  soft-tissue]
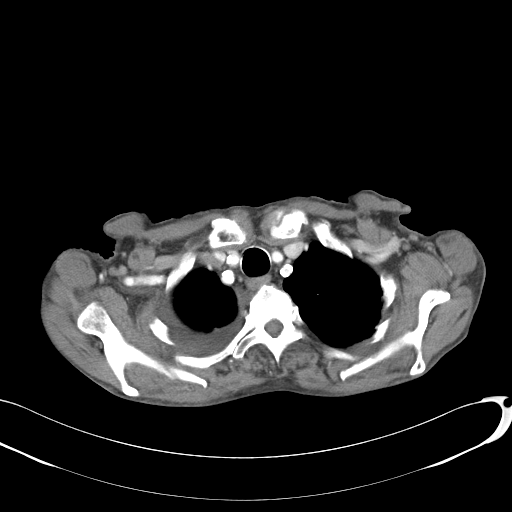

[Series 602: <mpr thick range> · coronal · 1.10mm/px · 3 of 72 slices shown]
[im 24/72  soft-tissue]
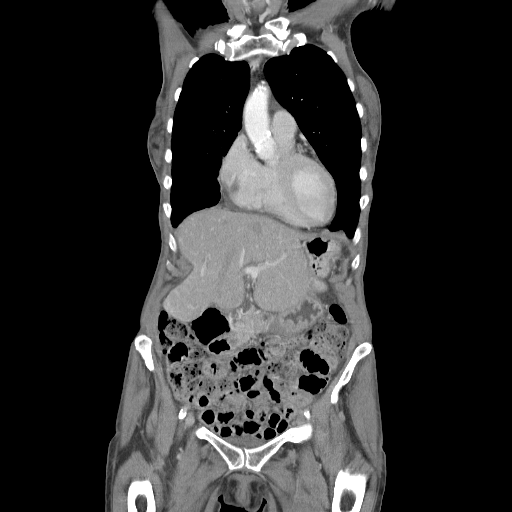
[im 32/72  soft-tissue]
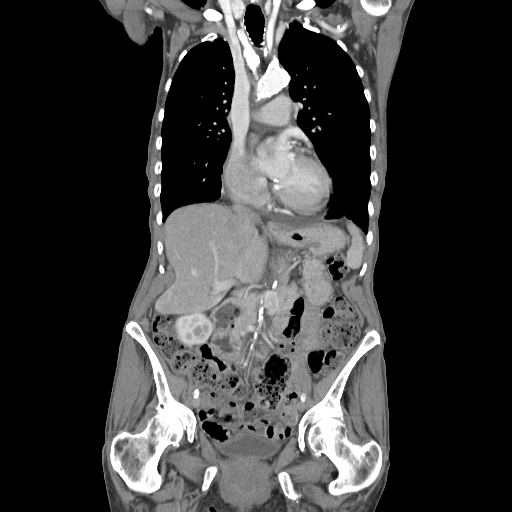
[im 40/72  soft-tissue]
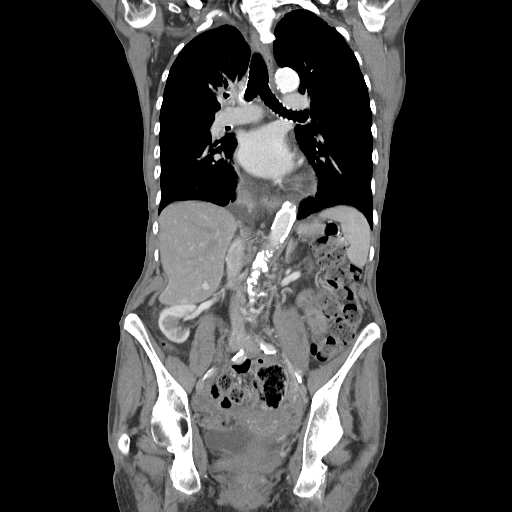

[16 of 46 positions shown; findings below may reference images not displayed]

FINDINGS: High right paratracheal lymph node measures 9 mm
(previously 11 mm).  Low right paratracheal lymph node measures 12
mm (previously 8 mm).  No axillary adenopathy.  Atherosclerotic
calcification of the arterial vasculature, including coronary
arteries.  Heart is at the upper limits of normal in size.  Small
pericardial effusion, new.

Small right pleural effusion, stable.  A heterogeneous mass in the
posterior segment right upper lobe measures 3.9 x 4.3 cm
(previously 7.2 x 5.3 cm). A sub solid nodule in the left lower
lobe measures 1.3 x 2.0 cm, likely stable.  Mild dependent air
space disease in the right lower lobe.  Airway is unremarkable.
IMPRESSION: 1.  Interval decrease in size of the primary right upper lobe mass
with mixed response to therapy in mediastinal lymph nodes.
2.  Subsolid left lower lobe nodule is likely stable and worrisome
for primary bronchogenic carcinoma.
3.  Small pericardial effusion, new.
4.  Small right pleural effusion, stable.

CT ABDOMEN AND PELVIS
FINDINGS: Liver is somewhat heterogeneous in attenuation with
peripheral and and somewhat nodular areas of hyper attenuation.
Gallbladder, adrenal glands, kidneys, spleen, pancreas, stomach and
small bowel are otherwise unremarkable.  Stool is seen in the
majority of the colon, indicative of constipation.

Atherosclerotic calcification of the arterial vasculature.  No
definite free fluid.  Uterus is visualized.  No definite
pathologically enlarged lymph nodes.

Degenerative changes are seen in the spine.  No worrisome lytic or
sclerotic lesions.
IMPRESSION: 1.  Mildly heterogeneous appearance of the liver, as detailed
above.  While this may be due to arterial phase imaging and
elevated right heart pressure, it is difficult to definitively
exclude metastatic disease.  If further evaluation is desired, MR
abdomen without and with contrast is recommended.
